# Patient Record
Sex: Female | Born: 1975 | Race: White | Hispanic: No | Smoking: Former smoker
Health system: Southern US, Community
[De-identification: ages and names within clinical notes are randomized; demographics above are authoritative.]

## PROBLEM LIST (undated history)

## (undated) DIAGNOSIS — M4316 Spondylolisthesis, lumbar region: Secondary | ICD-10-CM

## (undated) DIAGNOSIS — I7300 Raynaud's syndrome without gangrene: Secondary | ICD-10-CM

## (undated) DIAGNOSIS — K219 Gastro-esophageal reflux disease without esophagitis: Secondary | ICD-10-CM

## (undated) DIAGNOSIS — J45909 Unspecified asthma, uncomplicated: Secondary | ICD-10-CM

## (undated) DIAGNOSIS — Z8669 Personal history of other diseases of the nervous system and sense organs: Secondary | ICD-10-CM

## (undated) DIAGNOSIS — R87619 Unspecified abnormal cytological findings in specimens from cervix uteri: Secondary | ICD-10-CM

## (undated) HISTORY — DX: Raynaud's syndrome without gangrene: I73.00

## (undated) HISTORY — DX: Gastro-esophageal reflux disease without esophagitis: K21.9

## (undated) HISTORY — DX: Spondylolisthesis, lumbar region: M43.16

## (undated) HISTORY — DX: Unspecified asthma, uncomplicated: J45.909

## (undated) HISTORY — DX: Unspecified abnormal cytological findings in specimens from cervix uteri: R87.619

## (undated) HISTORY — PX: LAPAROSCOPY: SHX197

## (undated) HISTORY — DX: Personal history of other diseases of the nervous system and sense organs: Z86.69

---
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
## 2015-08-25 LAB — BASIC METABOLIC PANEL
BUN: 11 (ref 4–21)
Glucose: 83
Sodium: 139 (ref 137–147)

## 2015-08-25 LAB — TSH: TSH: 1.03 (ref <=5.90)

## 2017-01-13 LAB — HM PAP SMEAR: HM Pap smear: NEGATIVE

## 2018-04-17 ENCOUNTER — Encounter: Payer: Self-pay | Admitting: Family Medicine

## 2018-04-17 ENCOUNTER — Other Ambulatory Visit: Payer: Self-pay

## 2018-04-17 ENCOUNTER — Ambulatory Visit: Payer: Commercial Managed Care - PPO | Admitting: Family Medicine

## 2018-04-17 VITALS — BP 120/72 | HR 77 | Ht 64.0 in | Wt 121.6 lb

## 2018-04-17 DIAGNOSIS — J4520 Mild intermittent asthma, uncomplicated: Secondary | ICD-10-CM | POA: Diagnosis not present

## 2018-04-17 DIAGNOSIS — R87619 Unspecified abnormal cytological findings in specimens from cervix uteri: Secondary | ICD-10-CM

## 2018-04-17 DIAGNOSIS — F17200 Nicotine dependence, unspecified, uncomplicated: Secondary | ICD-10-CM

## 2018-04-17 DIAGNOSIS — N926 Irregular menstruation, unspecified: Secondary | ICD-10-CM

## 2018-04-17 DIAGNOSIS — M47816 Spondylosis without myelopathy or radiculopathy, lumbar region: Secondary | ICD-10-CM | POA: Insufficient documentation

## 2018-04-17 DIAGNOSIS — Z0000 Encounter for general adult medical examination without abnormal findings: Secondary | ICD-10-CM

## 2018-04-17 DIAGNOSIS — Z8669 Personal history of other diseases of the nervous system and sense organs: Secondary | ICD-10-CM

## 2018-04-17 DIAGNOSIS — R87612 Low grade squamous intraepithelial lesion on cytologic smear of cervix (LGSIL): Secondary | ICD-10-CM | POA: Insufficient documentation

## 2018-04-17 DIAGNOSIS — K219 Gastro-esophageal reflux disease without esophagitis: Secondary | ICD-10-CM | POA: Insufficient documentation

## 2018-04-17 DIAGNOSIS — D259 Leiomyoma of uterus, unspecified: Secondary | ICD-10-CM

## 2018-04-17 DIAGNOSIS — M4316 Spondylolisthesis, lumbar region: Secondary | ICD-10-CM

## 2018-04-17 HISTORY — DX: Personal history of other diseases of the nervous system and sense organs: Z86.69

## 2018-04-17 HISTORY — DX: Unspecified abnormal cytological findings in specimens from cervix uteri: R87.619

## 2018-04-17 HISTORY — DX: Spondylolisthesis, lumbar region: M43.16

## 2018-04-17 LAB — LIPID PANEL
CHOLESTEROL: 181 mg/dL (ref 0–200)
HDL: 65.8 mg/dL (ref >39.00)
LDL Cholesterol: 97 mg/dL (ref 0–99)
NonHDL: 114.76
Total CHOL/HDL Ratio: 3
Triglycerides: 90 mg/dL (ref 0.0–149.0)
VLDL: 18 mg/dL (ref 0.0–40.0)

## 2018-04-17 LAB — CBC WITH DIFFERENTIAL/PLATELET
Basophils Absolute: 0.1 10*3/uL (ref 0.0–0.1)
Basophils Relative: 0.8 % (ref 0.0–3.0)
EOS PCT: 3.4 % (ref 0.0–5.0)
Eosinophils Absolute: 0.2 10*3/uL (ref 0.0–0.7)
HCT: 49.4 % — ABNORMAL HIGH (ref 36.0–46.0)
HEMOGLOBIN: 16.9 g/dL — AB (ref 12.0–15.0)
LYMPHS PCT: 24.7 % (ref 12.0–46.0)
Lymphs Abs: 1.8 10*3/uL (ref 0.7–4.0)
MCHC: 34.2 g/dL (ref 30.0–36.0)
MCV: 97.5 fl (ref 78.0–100.0)
Monocytes Absolute: 0.5 10*3/uL (ref 0.1–1.0)
Monocytes Relative: 6.6 % (ref 3.0–12.0)
Neutro Abs: 4.6 10*3/uL (ref 1.4–7.7)
Neutrophils Relative %: 64.5 % (ref 43.0–77.0)
Platelets: 248 10*3/uL (ref 150.0–400.0)
RBC: 5.07 Mil/uL (ref 3.87–5.11)
RDW: 13.4 % (ref 11.5–15.5)
WBC: 7.2 10*3/uL (ref 4.0–10.5)

## 2018-04-17 LAB — COMPREHENSIVE METABOLIC PANEL
ALBUMIN: 5.1 g/dL (ref 3.5–5.2)
ALK PHOS: 57 U/L (ref 39–117)
ALT: 15 U/L (ref 0–35)
AST: 12 U/L (ref 0–37)
BUN: 5 mg/dL — AB (ref 6–23)
CO2: 28 mEq/L (ref 19–32)
Calcium: 10.2 mg/dL (ref 8.4–10.5)
Chloride: 105 mEq/L (ref 96–112)
Creatinine, Ser: 0.64 mg/dL (ref 0.40–1.20)
GFR: 108.62 mL/min (ref >60.00)
Glucose, Bld: 77 mg/dL (ref 70–99)
POTASSIUM: 4.1 meq/L (ref 3.5–5.1)
Sodium: 142 mEq/L (ref 135–145)
TOTAL PROTEIN: 7.6 g/dL (ref 6.0–8.3)
Total Bilirubin: 0.6 mg/dL (ref 0.2–1.2)

## 2018-04-17 LAB — TSH: TSH: 1.29 u[IU]/mL (ref 0.35–4.50)

## 2018-04-17 LAB — POCT URINE PREGNANCY: Preg Test, Ur: NEGATIVE

## 2018-04-17 NOTE — Patient Instructions (Signed)
Please return in 12 months for your annual complete physical; please come fasting.  I will release your lab results to you on your MyChart account with further instructions. Please reply with any questions.   We will call you with information regarding your referral appointment. GYN for f/u on pap smears and irregular bleeding and birth control.  If you do not hear from [place] within the next 2 weeks, please let me know. It can take 1-2 weeks to get appointments set up with the specialists.   It was a pleasure meeting you today! Thank you for choosing [place] to meet your healthcare needs! I truly look forward to working with you. If you have any questions or concerns, please send me a message via Mychart or call the office at [phone]. Please do these things to maintain good health!   Exercise at least 30-45 minutes a day,  4-5 days a week.   Eat a low-fat diet with lots of fruits and vegetables, up to 7-9 servings per day.  Drink plenty of water daily. Try to drink 8 8oz glasses per day.  Seatbelts can save your life. Always wear your seatbelt.  Place Smoke Detectors on every level of your home and check batteries every year.  Schedule an appointment with an eye doctor for an eye exam every 1-2 years  Safe sex - use condoms to protect yourself from STDs if you could be exposed to these types of infections. Use birth control if you do not want to become pregnant and are sexually active.  Avoid heavy alcohol use. If you drink, keep it to less than 2 drinks/day and not every day.  [place].  Choose someone you trust that could speak for you if you became unable to speak for yourself.  Depression is common in our stressful world.If you're feeling down or losing interest in things you normally enjoy, please come in for a visit.  If anyone is threatening or hurting you, please get help. Physical or Emotional Violence is never OK.

## 2018-04-17 NOTE — Progress Notes (Signed)
Subjective  CC:  Chief Complaint  Patient presents with  . Establish Care    Relocated here from [place], last Physical 2016, unsure of month   . Menstrual Problem    Patient states that she hasn't had a period in over two months, wants Thyroid Checked    HPI: Julie Horn is a 42 y.o. female who presents to [place] at [facility] today to establish care with me as a new patient.  42 year old G33, P0, newly remarried after widowed in 2017 who relocated to [facility] from [place].  She lives with her husband, has 2 children, and her 65 year old adopted son.  She works full-time as a Warehouse manager.  She is working remotely.  Her transition has been good.  Here for annual exam and new problem visit. She has the following concerns or needs:  Her main concern is related to her gynecologic health.  She reports a long history of irregular and abnormal menstrual bleeding patterns.  Over the last year and a half they have been pretty well controlled however now missing periods.  Her last period was in July.  She has taken pregnancy test and they are negative.  She has never been pregnant.  Possibly infertile.  She does not use birth control.  She denies painful vaginal bleeding or vaginal discharge.  No history of STDs.  She does have a history of abnormal Pap smear status post colposcopy in 2017 without further evaluation done.  She would like to consider birth control.  She did not do well on oral contraceptives.  She is a smoker.  Her weight is stable, diet is regular, exercises normal.  She has a history of uterine fibroids.  Past medical history is significant for asthma, chronic smoker with recurrent bronchitis.  Rare albuterol need.  Mild allergies, GERD symptoms.  History of low back pain due to arthritis.  History of migraines in the been well controlled behaviorally over the last several years.  No history of mood problems.  Health maintenance:  Due for repeat Pap smear but prefers gynecology review, we need to request old records.  Refuses flu vaccination.  Lives healthy lifestyle.  Assessment  1. Annual physical exam   2. Missed menses   3. Nicotine dependence with current use   4. Intermittent asthma, uncomplicated   5. Gastroesophageal reflux disease, esophagitis presence not specified   6. History of migraine   7. Spondylosis of lumbar region without myelopathy or radiculopathy   8. Abnormal cervical Papanicolaou smear, unspecified abnormal pap finding   9. Uterine leiomyoma, unspecified location      Plan  Female Wellness Visit:  Age appropriate Health Maintenance and Prevention measures were discussed with patient. Included topics are cancer screening recommendations, ways to keep healthy (see AVS) including dietary and exercise recommendations, regular eye and dental care, use of seat belts, and avoidance of moderate alcohol use and tobacco use.   BMI: discussed patient's BMI and encouraged positive lifestyle modifications to help get to or maintain a target BMI.  HM needs and immunizations were addressed and ordered. See below for orders. See HM and immunization section for updates.  Routine labs and screening tests ordered including cmp, cbc and lipids where appropriate.  Discussed recommendations regarding Vit D and calcium supplementation (see AVS)  New patient new problem visit:   Discussed smoking cessation: Patient is pre-contemplative at this time.  Discussed inability to use hormonal birth control with estrogen if she is smoking.  Patient will work on thinking about quitting.  Stress is the main barrier at this time.  Discussed link between asthma, COPD and smoking.  Missed menses, abnormal Pap, need for birth control: Refer to gynecology and request old records.  Check prolactin and TSH today.  Low back pain: Not active now.  Continue healthy lifestyle and exercise.  GERD: Manages behaviorally and with  natural therapies.  Weight management has significantly helped both her back pain and her GERD.  Migraine history: We will monitor.  Not active now.   I discussed with patient that the coding for this visit will include the wellness/preventive visit along with the code for a new patient problem visit. Documentation reflects the need for this.  Follow up:  No follow-ups on file. Orders Placed This Encounter  Procedures  . CBC with Differential/Platelet  . Comprehensive metabolic panel  . Lipid panel  . HIV Antibody (routine testing w rflx)  . TSH  . Prolactin  . Ambulatory referral to Obstetrics / Gynecology  . POCT urine pregnancy   No orders of the defined types were placed in this encounter.    Depression screen PHQ 2/9 04/17/2018  Decreased Interest 0  Down, Depressed, Hopeless 0  PHQ - 2 Score 0    We updated and reviewed the patient's past history in detail and it is documented below.  Patient Active Problem List   Diagnosis Date Noted  . Nicotine dependence with current use 04/17/2018  . Intermittent asthma, uncomplicated 93/81/0175  . GERD (gastroesophageal reflux disease) 04/17/2018  . History of migraine 04/17/2018    Had been managed on Topamax but had side effect. Now not active.   Marland Kitchen Spondylolisthesis of lumbar region 04/17/2018  . 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 Abnormal Pap smear of cervix 04/17/2018  . Fibroid uterus 04/17/2018   Health Maintenance  Topic Date Due  . HIV Screening  03/03/1992  . PAP SMEAR  07/25/2018  . TETANUS/TDAP  07/25/2022  . INFLUENZA VACCINE  Discontinued    There is no immunization history on file for this patient. Current Meds  Medication Sig  . B COMPLEX VITAMINS PO Take by mouth.  . Chromium Picolinate (CHROMIUM PICOLATE PO) Take 500 mg by mouth.  . L-LYSINE PO Take 1,000 mg by mouth.  . Multiple Vitamin (MULTIVITAMIN) tablet Take 1 tablet by mouth daily.  Marland Kitchen OVER THE COUNTER MEDICATION 500  mg.    Allergies: Patient is allergic to latex and sulfa antibiotics. Past Medical History Patient  has a past medical history of Abnormal Pap smear of cervix (04/17/2018), Asthma, GERD (gastroesophageal reflux disease), History of migraine (04/17/2018), and Spondylolisthesis of lumbar region (04/17/2018). Past Surgical History Patient  has a past surgical history that includes Tonsillectomy and adenoidectomy (1983) and laparoscopy. Family History: Patient family history is not on file. Social History:  Patient  reports that she has been smoking cigarettes. She has a 20.00 pack-year smoking history. She has never used smokeless tobacco. She reports that she drinks alcohol. She reports that she has current or past drug history.  Review of Systems: Constitutional: negative for fever or malaise Ophthalmic: negative for photophobia, double vision or loss of vision Cardiovascular: negative for chest pain, dyspnea on exertion, or new LE swelling Respiratory: negative for SOB or persistent cough Gastrointestinal: negative for abdominal pain, change in bowel habits or melena Genitourinary: negative for dysuria or gross hematuria Musculoskeletal: negative for new gait disturbance or muscular weakness Integumentary: negative for new or persistent  rashes Neurological: negative for TIA or stroke symptoms Psychiatric: negative for SI or delusions Allergic/Immunologic: negative for hives  Patient Care Team    Relationship Specialty Notifications Start End  Leamon Arnt, MD PCP - General Family Medicine  04/17/18     Objective  Vitals: BP 120/72   Pulse 77   Ht 5\' 4"  (1.626 m)   Wt 121 lb 9.6 oz (55.2 kg)   SpO2 98%   BMI 20.87 kg/m  General:  Well developed, well nourished, no acute distress, thin Psych:  Alert and oriented,normal mood and affect HEENT:  Normocephalic, atraumatic, non-icteric sclera, PERRL, oropharynx is without mass or exudate, supple neck without adenopathy, mass or  thyromegaly Cardiovascular:  RRR without gallop, rub or murmur, nondisplaced PMI Respiratory:  Good breath sounds bilaterally, CTAB with normal respiratory effort, no wheezing Gastrointestinal: normal bowel sounds, soft, non-tender, no noted masses. No HSM MSK: no deformities, contusions. Joints are without erythema or swelling Skin:  Warm, no rashes or suspicious lesions noted Neurologic:    Mental status is normal. Gross motor and sensory exams are normal. Normal gait, no tremor  Office Visit on 04/17/2018  Component Date Value Ref Range Status  . Preg Test, Ur 04/17/2018 Negative  Negative Final     Commons side effects, risks, benefits, and alternatives for medications and treatment plan prescribed today were discussed, and the patient expressed understanding of the given instructions. Patient is instructed to call or message via MyChart if he/she has any questions or concerns regarding our treatment plan. No barriers to understanding were identified. We discussed Red Flag symptoms and signs in detail. Patient expressed understanding regarding what to do in case of urgent or emergency type symptoms.   Medication list was reconciled, printed and provided to the patient in AVS. Patient instructions and summary information was reviewed with the patient as documented in the AVS. This note was prepared with assistance of Dragon voice recognition software. Occasional wrong-word or sound-a-like substitutions may have occurred due to the inherent limitations of voice recognition software

## 2018-04-18 LAB — HIV ANTIBODY (ROUTINE TESTING W REFLEX): HIV: NONREACTIVE

## 2018-04-18 LAB — PROLACTIN: Prolactin: 9.5 ng/mL

## 2018-05-01 ENCOUNTER — Encounter: Payer: Self-pay | Admitting: Family Medicine

## 2018-05-01 MED ORDER — DIAZEPAM 5 MG PO TABS: ORAL_TABLET | ORAL | 0 refills | Status: DC

## 2018-05-09 ENCOUNTER — Encounter: Payer: Self-pay | Admitting: Emergency Medicine

## 2018-05-22 ENCOUNTER — Other Ambulatory Visit: Payer: Self-pay

## 2018-05-22 ENCOUNTER — Encounter: Payer: Self-pay | Admitting: Family Medicine

## 2018-05-22 ENCOUNTER — Ambulatory Visit: Payer: Commercial Managed Care - PPO | Admitting: Family Medicine

## 2018-05-22 ENCOUNTER — Ambulatory Visit: Payer: Self-pay

## 2018-05-22 VITALS — BP 122/84 | HR 83 | Temp 98.1°F | Ht 64.0 in | Wt 123.8 lb

## 2018-05-22 DIAGNOSIS — J4521 Mild intermittent asthma with (acute) exacerbation: Secondary | ICD-10-CM

## 2018-05-22 DIAGNOSIS — J209 Acute bronchitis, unspecified: Secondary | ICD-10-CM

## 2018-05-22 DIAGNOSIS — T7840XA Allergy, unspecified, initial encounter: Secondary | ICD-10-CM

## 2018-05-22 MED ORDER — GUAIFENESIN-CODEINE 100-10 MG/5ML PO SOLN: 5.0000 mL | Freq: Four times a day (QID) | ORAL | 0 refills | Status: DC | PRN

## 2018-05-22 NOTE — Telephone Encounter (Signed)
Pt. Reports she went to a minute clinic last week and was started in Z-Pak and steroid for bronchitis. Still coughing and "is miserable." Started breaking out in hives day before yesterday. Took Benadryl last and when she got up this morning, "the hives were even bigger - some as large as my hand." "Very itchy." Hives are on her bottom and legs only this morning. Denies any chest pain or shortness of breath. Apointment made for this morning. Instructed if symptoms worsen to go to ED. Verbalizes understanding.  Reason for Disposition . [1] MODERATE-SEVERE hives persist (i.e., hives interfere with normal activities or work) AND [2] taking antihistamine (e.g., Benadryl, Claritin) > 24 hours  Answer Assessment - Initial Assessment Questions 1. APPEARANCE: "What does the rash look like?"      Hives 2. LOCATION: "Where is the rash located?"      On her behind and legs 3. NUMBER: "How many hives are there?"      Too many to count 4. SIZE: "How big are the hives?" (inches, cm, compare to coins) "Do they all look the same or is there lots of variation in shape and size?"      Size of her hand and smaller 5. ONSET: "When did the hives begin?" (Hours or days ago)      2 DAYS AGO 6. ITCHING: "Does it itch?" If so, ask: "How bad is the itch?"    - MILD: doesn't interfere with normal activities   - MODERATE - SEVERE: interferes with work, school, sleep, or other activities      mODERATE 7. RECURRENT PROBLEM: "Have you had hives before?" If so, ask: "When was the last time?" and "What happened that time?"      Yes 8. TRIGGERS: "Were you exposed to any new food, plant, cosmetic product or animal just before the hives began?"     Antibiotic 9. OTHER SYMPTOMS: "Do you have any other symptoms?" (e.g., fever, tongue swelling, difficulty breathing, abdominal pain)     No 10. PREGNANCY: "Is there any chance you are pregnant?" "When was your last menstrual period?"       No  Protocols used: HIVES-A-AH

## 2018-05-22 NOTE — Progress Notes (Signed)
Subjective  CC:  Chief Complaint  Patient presents with  . Urticaria    hives mostly on her legs Patient states she started z-pak on friday, treated with Benadryl, keep coming back     HPI: Julie Horn is a 42 y.o. female who presents to the office today to address the problems listed above in the chief complaint.  42 year old recently seen by [facility] minute clinic and treated for bronchitis with Z-Pak, prednisone taper, cough syrup and albuterol inhaler.  She reports she continues to have significant coughing spasms.  No shortness of breath and fever.  However, last night broke out in diffuse rash upper chest and legs.  She brings in pictures.  The rash resolved with Benadryl.  She denies current shortness of breath.  She is extremely exhausted and has lower abdominal pain from coughing.  No other GI symptoms.  She is a smoker, she does have asthma.  I reviewed the note from minute clinic Assessment  1. Acute bronchitis, unspecified organism   2. Mild intermittent asthma with exacerbation   3. Allergic reaction to drug, initial encounter      Plan   Acute bronchitis: Almost completed the Z-Pak.  No more antibiotics indicated.  Lungs are clear to auscultation now.  Complete prednisone taper.  Add Robitussin with codeine for cough and sleep.  Albuterol as needed  Allergic drug reaction to Z-Pak.  Educated.  Zyrtec nightly and Benadryl as needed over the next week. Follow up: Follow-up if worsens  No orders of the defined types were placed in this encounter.  Meds ordered this encounter  Medications  . guaiFENesin-codeine 100-10 MG/5ML syrup    Sig: Take 5 mLs by mouth every 6 (six) hours as needed for cough.    Dispense:  120 mL    Refill:  0      I reviewed the patients updated PMH, FH, and SocHx.    Patient Active Problem List   Diagnosis Date Noted  . Nicotine dependence with current use 04/17/2018    Priority: High  . Abnormal Pap smear of cervix 04/17/2018    Priority:  High  . Intermittent asthma, uncomplicated 84/13/2440    Priority: Medium  . GERD (gastroesophageal reflux disease) 04/17/2018    Priority: Medium  . History of migraine 04/17/2018    Priority: Medium  . Spondylolisthesis of lumbar region 04/17/2018    Priority: Medium  . 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 Fibroid uterus 04/17/2018    Priority: Medium   Current Meds  Medication Sig  . B COMPLEX VITAMINS PO Take by mouth.  . Chromium Picolinate (CHROMIUM PICOLATE PO) Take 500 mg by mouth.  . L-LYSINE PO Take 1,000 mg by mouth.  . Multiple Vitamin (MULTIVITAMIN) tablet Take 1 tablet by mouth daily.  Marland Kitchen OVER THE COUNTER MEDICATION 500 mg.  . predniSONE (DELTASONE) 10 MG tablet   . promethazine-dextromethorphan (PROMETHAZINE-DM) 6.25-15 MG/5ML syrup     Allergies: Patient is allergic to latex; levofloxacin; sulfa antibiotics; and azithromycin. Family History: Patient family history includes Asthma in her brother and mother; Cancer in her maternal grandmother; Heart attack in her maternal grandfather. Social History:  Patient  reports that she has been smoking cigarettes. She has a 20.00 pack-year smoking history. She has never used smokeless tobacco. She reports that she drinks alcohol. She reports that she has current or past drug history.  Review of Systems: Constitutional: Negative for fever malaise or anorexia Cardiovascular: negative  for chest pain Respiratory: negative for SOB or persistent cough Gastrointestinal: negative for abdominal pain  Objective  Vitals: BP 122/84   Pulse 83   Temp 98.1 F (36.7 C)   Ht 5\' 4"  (1.626 m)   Wt 123 lb 12.8 oz (56.2 kg)   SpO2 99%   BMI 21.25 kg/m  General: no acute distress , A&Ox3, appears tired, nontoxic, no respiratory distress HEENT: PEERL, conjunctiva normal, Oropharynx moist,neck is supple, normal TMs Cardiovascular:  RRR without murmur or gallop.  Respiratory:  Good breath  sounds bilaterally, CTAB with normal respiratory effort with occasional rhonchi, no wheeze Skin:  Warm, no rashes Reviewed pictures of diffuse maculopapular rash.     Commons side effects, risks, benefits, and alternatives for medications and treatment plan prescribed today were discussed, and the patient expressed understanding of the given instructions. Patient is instructed to call or message via MyChart if he/she has any questions or concerns regarding our treatment plan. No barriers to understanding were identified. We discussed Red Flag symptoms and signs in detail. Patient expressed understanding regarding what to do in case of urgent or emergency type symptoms.   Medication list was reconciled, printed and provided to the patient in AVS. Patient instructions and summary information was reviewed with the patient as documented in the AVS. This note was prepared with assistance of Dragon voice recognition software. Occasional wrong-word or sound-a-like substitutions may have occurred due to the inherent limitations of voice recognition software

## 2018-05-22 NOTE — Patient Instructions (Signed)
Please follow up if symptoms do not improve or as needed.   Start Zyrtec 10mg  nightly. Use benadryl in between as needed for the rash.  Take the new cough syrup at night.  Get some rest!!

## 2018-05-30 ENCOUNTER — Encounter: Payer: Self-pay | Admitting: Family Medicine

## 2018-05-31 MED ORDER — ALBUTEROL SULFATE HFA 108 (90 BASE) MCG/ACT IN AERS: 2.0000 | INHALATION_SPRAY | RESPIRATORY_TRACT | 2 refills | Status: DC | PRN

## 2018-06-08 ENCOUNTER — Encounter: Payer: Self-pay | Admitting: Family Medicine

## 2018-06-08 NOTE — Telephone Encounter (Signed)
Please print form, complete it from cpe visit in December and fax for pt. Send message to her once complete. Thanks.

## 2018-06-08 NOTE — Telephone Encounter (Signed)
Please advise 

## 2018-06-18 ENCOUNTER — Encounter: Payer: Self-pay | Admitting: Family Medicine

## 2018-08-31 DIAGNOSIS — H04123 Dry eye syndrome of bilateral lacrimal glands: Secondary | ICD-10-CM | POA: Diagnosis not present

## 2018-08-31 DIAGNOSIS — H40052 Ocular hypertension, left eye: Secondary | ICD-10-CM | POA: Diagnosis not present

## 2018-08-31 DIAGNOSIS — Z8669 Personal history of other diseases of the nervous system and sense organs: Secondary | ICD-10-CM | POA: Diagnosis not present

## 2018-08-31 DIAGNOSIS — H40013 Open angle with borderline findings, low risk, bilateral: Secondary | ICD-10-CM | POA: Diagnosis not present

## 2018-10-15 ENCOUNTER — Telehealth: Payer: Self-pay

## 2018-10-15 NOTE — Telephone Encounter (Signed)
LMOVM with instructions to call back. CRM created.

## 2018-10-15 NOTE — Telephone Encounter (Signed)
Patient is experiencing flu like symptoms with fever. Patient has asthma and is almost out of her Albuterol, Please advise is patient needs to be seen or if meds can be refilled.

## 2018-10-15 NOTE — Telephone Encounter (Signed)
Spoke with CMA concerning above patient. She is SOB on phone. Triaged to ER giving current pandemic, current symptoms and respiratory distress.

## 2018-10-15 NOTE — Telephone Encounter (Signed)
Conversation with patient. No noted SOB while talking. She denies any difficulty breathing. Temperature this morning 99.6 oral. No known travels no known exposures. Occasional dry cough. Reported mild wheezing last night but none this morning after albuterol inhaler x1. Care advice reviewed:if fever persist more than 3 days or above 100.5 to call back. Also, any difficulty breathing/SOB/wheezing to call back. She has refills on the albuterol and will pick up today. Hygiene/safety precautions reviewed.

## 2018-10-18 ENCOUNTER — Encounter: Payer: Self-pay | Admitting: Family Medicine

## 2018-10-18 ENCOUNTER — Other Ambulatory Visit: Payer: Self-pay

## 2018-10-18 ENCOUNTER — Ambulatory Visit (INDEPENDENT_AMBULATORY_CARE_PROVIDER_SITE_OTHER): Payer: Commercial Managed Care - PPO | Admitting: Family Medicine

## 2018-10-18 DIAGNOSIS — J069 Acute upper respiratory infection, unspecified: Secondary | ICD-10-CM

## 2018-10-18 DIAGNOSIS — J301 Allergic rhinitis due to pollen: Secondary | ICD-10-CM | POA: Diagnosis not present

## 2018-10-18 DIAGNOSIS — J4521 Mild intermittent asthma with (acute) exacerbation: Secondary | ICD-10-CM

## 2018-10-18 DIAGNOSIS — F17200 Nicotine dependence, unspecified, uncomplicated: Secondary | ICD-10-CM | POA: Diagnosis not present

## 2018-10-18 MED ORDER — MONTELUKAST SODIUM 10 MG PO TABS: 10.0000 mg | ORAL_TABLET | Freq: Every day | ORAL | 3 refills | Status: DC

## 2018-10-18 NOTE — Progress Notes (Signed)
I have discussed the procedure for the virtual visit with the patient who has given consent to proceed with assessment and treatment.   Tiara S Simmons, CMA     

## 2018-10-18 NOTE — Telephone Encounter (Signed)
Please call patient to set up video visit for allergy and asthma symptoms and discuss medications and f/u from recent illness.

## 2018-10-18 NOTE — Progress Notes (Signed)
Virtual Visit via Video Note  Subjective  CC:  Chief Complaint  Patient presents with  . Allergies    She reports that on 10/22/2018, she SOB, wheezing, coughing, and fever of 101.     HPI:  I connected with Julie Horn on 10/18/18 at  2:30 PM EDT by a video enabled telemedicine application and verified that I am speaking with the correct person using two identifiers. Location patient: Home Location provider: [facility], Office Persons participating in the virtual visit: Julie Horn, Leamon Arnt, MD   I discussed the limitations of evaluation and management by telemedicine and the availability of in person appointments. The patient expressed understanding and agreed to proceed.   Reviewed telephone note from 3/23: fever and URI illness. Then today called in to get singulair so I wanted to visit with her to ensure her respiratory stability and clarify the illness and progression. 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 Used albuterol and tylenol and otc cough medications. Now improving, still requiring albuterol q 4 with good results: breathing returns to normal after use. Dry cough. Lessening. No malaise, n/v/d or abdominal pain.   + allergies and has used singulair in the past and would like to restart.   Assessment  1. Mild intermittent asthma with exacerbation   2. Seasonal allergic rhinitis due to pollen   3. Viral URI      Plan   Asthma exacerbation with viral respiratory infection, resolving:  Nonspecific viral illness or coronavirus: mild sxs. Not currently meeting criteria for [facility] testing requirements. Recommend self isolating (has been working remotely from home), continue albuterol and otc meds and monitor breathing status. Add singulair.   Call if worsening.  I discussed the assessment and treatment plan with the patient. The patient was provided  an opportunity to ask questions and all were answered. The patient agreed with the plan and demonstrated an understanding of the instructions.   The patient was advised to call back or seek an in-person evaluation if the symptoms worsen or if the condition fails to improve as anticipated. Follow up: Return in about 6 months (around 04/20/2019) for complete physical.  Visit date not found  No orders of the defined types were placed in this encounter.     I reviewed the patients updated PMH, FH, and SocHx.    Patient Active Problem List   Diagnosis Date Noted  . Nicotine dependence with current use 04/17/2018    Priority: High  . Abnormal Pap smear of cervix 04/17/2018    Priority: High  . Intermittent asthma, uncomplicated 13/24/4010    Priority: Medium  . GERD (gastroesophageal reflux disease) 04/17/2018    Priority: Medium  . History of migraine 04/17/2018    Priority: Medium  . Spondylolisthesis of lumbar region 04/17/2018    Priority: Medium  . 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 Fibroid uterus 04/17/2018    Priority: Medium   No outpatient medications have been marked as taking for the 10/18/18 encounter (Office Visit) with Leamon Arnt, MD.    Allergies: Patient is allergic to latex; levofloxacin; sulfa antibiotics; and azithromycin. Family History: Patient family history includes Asthma in her brother and mother; Cancer in her maternal grandmother; Heart attack in her maternal grandfather. Social History:  Patient  reports that she has been smoking cigarettes. She has a 20.00  pack-year smoking history. She has never used smokeless tobacco. She reports current alcohol use. She reports previous drug use.   ROS: negative pleuritic chest pain, no sob, + wheezing, + dry cough,   @OBJECTIVE /OBSERVATIONS@ Vitals: There were no vitals taken for this visit. pt reported low grade fever 99.4 today General: no acute distress ,  A&Ox3 Respirations were normal appearing w/o tachypnea nor retractions.  Nasal congestion present.    I provided 15 minutes of non-face-to-face time during this encounter. Leamon Arnt, MD

## 2018-10-24 ENCOUNTER — Encounter: Payer: Self-pay | Admitting: Family Medicine

## 2018-10-25 ENCOUNTER — Encounter: Payer: Self-pay | Admitting: Family

## 2018-10-25 ENCOUNTER — Telehealth: Payer: Commercial Managed Care - PPO | Admitting: Family

## 2018-10-25 ENCOUNTER — Other Ambulatory Visit: Payer: Self-pay | Admitting: Family Medicine

## 2018-10-25 DIAGNOSIS — R059 Cough, unspecified: Secondary | ICD-10-CM

## 2018-10-25 DIAGNOSIS — R05 Cough: Secondary | ICD-10-CM

## 2018-10-25 MED ORDER — DOXYCYCLINE HYCLATE 100 MG PO TABS: 100.0000 mg | ORAL_TABLET | Freq: Two times a day (BID) | ORAL | 0 refills | Status: AC

## 2018-10-25 MED ORDER — PROMETHAZINE-DM 6.25-15 MG/5ML PO SYRP: 5.0000 mL | ORAL_SOLUTION | Freq: Four times a day (QID) | ORAL | 0 refills | Status: DC | PRN

## 2018-10-25 NOTE — Progress Notes (Signed)
E-Visit for Corona Virus Screening  Based on your current symptoms, you may very well have the virus, however your symptoms are mild. Currently, not all patients are being tested. If the symptoms are mild and there is not a known exposure, performing the test is not indicated.  Coronavirus disease 2019 (COVID-19) is a respiratory illness that can spread from person to person. The virus that causes COVID-19 is a new virus that was first identified in the country of [place] but is now found in multiple other countries and has spread to the [place].  Symptoms associated with the virus are mild to severe fever, cough, and shortness of breath. There is currently no vaccine to protect against COVID-19, and there is no specific antiviral treatment for the virus.   To be considered HIGH RISK for Coronavirus (COVID-19), you have to meet the following criteria:  . Traveled to [place], [place], [place], [place] or [place]; or in the [place] to [place], [place], [place], or [place]; and have fever, cough, and shortness of breath within the last 2 weeks of travel OR  . Been in close contact with a person diagnosed with COVID-19 within the last 2 weeks and have fever, cough, and shortness of breath  . IF YOU DO NOT MEET THESE CRITERIA, YOU ARE CONSIDERED LOW RISK FOR COVID-19.   It is vitally important that if you feel that you have an infection such as this virus or any other virus that you stay home and away from places where you may spread it to others.  You should self-quarantine for 14 days if you have symptoms that could potentially be coronavirus and avoid contact with people age 80 and older.   You can use medication such as A prescription cough medication called Phenergan DM 6.25 mg/15 mg. You make take one teaspoon / 5 ml every 4-6 hours as needed for cough  You may also take acetaminophen (Tylenol) as needed for fever.  You have been sick 11 days at this point. I would recommend taking  3 additional days off work. I will send you a note.   Reduce your risk of any infection by using the same precautions used for avoiding the common cold or flu:  Marland Kitchen Wash your hands often with soap and warm water for at least 20 seconds.  If soap and water are not readily available, use an alcohol-based hand sanitizer with at least 60% alcohol.  . If coughing or sneezing, cover your mouth and nose by coughing or sneezing into the elbow areas of your shirt or coat, into a tissue or into your sleeve (not your hands). . Avoid shaking hands with others and consider head nods or verbal greetings only. . Avoid touching your eyes, nose, or mouth with unwashed hands.  . Avoid close contact with people who are sick. . Avoid places or events with large numbers of people in one location, like concerts or sporting events. . Carefully consider travel plans you have or are making. . If you are planning any travel outside or inside the [place], visit the CDC's Travelers' Health webpage for the latest health notices. . If you have some symptoms but not all symptoms, continue to monitor at home and seek medical attention if your symptoms worsen. . If you are having a medical emergency, call 911.  HOME CARE . Only take medications as instructed by your medical team. . Drink plenty of fluids and get plenty of rest. . A steam or  ultrasonic humidifier can help if you have congestion.   GET HELP RIGHT AWAY IF: . You develop worsening fever. . You become short of breath . You cough up blood. . Your symptoms become more severe MAKE SURE YOU   Understand these instructions.  Will watch your condition.  Will get help right away if you are not doing well or get worse.  Your e-visit answers were reviewed by a board certified advanced clinical practitioner to complete your personal care plan.  Depending on the condition, your plan could have included both over the counter or prescription medications.  If there is a  problem please reply once you have received a response from your provider. Your safety is important to [place].  If you have drug allergies check your prescription carefully.    You can use MyChart to ask questions about today's visit, request a non-urgent call back, or ask for a work or school excuse for 24 hours related to this e-Visit. If it has been greater than 24 hours you will need to follow up with your provider, or enter a new e-Visit to address those concerns. You will get an e-mail in the next two days asking about your experience.  I hope that your e-visit has been valuable and will speed your recovery. Thank you for using e-visits.

## 2018-10-25 NOTE — Progress Notes (Signed)
See mychart message.  Treating empirically for respiratory sxs and low grade fevers on and off x 10 days.  ? Covid-19

## 2018-10-29 ENCOUNTER — Encounter: Payer: Self-pay | Admitting: Family Medicine

## 2018-10-29 ENCOUNTER — Emergency Department (HOSPITAL_BASED_OUTPATIENT_CLINIC_OR_DEPARTMENT_OTHER): Payer: Commercial Managed Care - PPO

## 2018-10-29 ENCOUNTER — Other Ambulatory Visit: Payer: Self-pay

## 2018-10-29 ENCOUNTER — Ambulatory Visit (INDEPENDENT_AMBULATORY_CARE_PROVIDER_SITE_OTHER): Payer: Commercial Managed Care - PPO | Admitting: Family Medicine

## 2018-10-29 ENCOUNTER — Emergency Department (HOSPITAL_BASED_OUTPATIENT_CLINIC_OR_DEPARTMENT_OTHER)
Admission: EM | Admit: 2018-10-29 | Discharge: 2018-10-29 | Disposition: A | Discharge status: Home / Self Care | Payer: Commercial Managed Care - PPO | Attending: Emergency Medicine | Admitting: Emergency Medicine

## 2018-10-29 ENCOUNTER — Encounter (HOSPITAL_BASED_OUTPATIENT_CLINIC_OR_DEPARTMENT_OTHER): Payer: Self-pay | Admitting: Emergency Medicine

## 2018-10-29 DIAGNOSIS — R6889 Other general symptoms and signs: Secondary | ICD-10-CM

## 2018-10-29 DIAGNOSIS — R0602 Shortness of breath: Secondary | ICD-10-CM

## 2018-10-29 DIAGNOSIS — F17200 Nicotine dependence, unspecified, uncomplicated: Secondary | ICD-10-CM | POA: Diagnosis not present

## 2018-10-29 DIAGNOSIS — R05 Cough: Secondary | ICD-10-CM | POA: Diagnosis not present

## 2018-10-29 DIAGNOSIS — Z03818 Encounter for observation for suspected exposure to other biological agents ruled out: Secondary | ICD-10-CM | POA: Diagnosis not present

## 2018-10-29 DIAGNOSIS — B9789 Other viral agents as the cause of diseases classified elsewhere: Secondary | ICD-10-CM

## 2018-10-29 DIAGNOSIS — J988 Other specified respiratory disorders: Secondary | ICD-10-CM

## 2018-10-29 DIAGNOSIS — F17210 Nicotine dependence, cigarettes, uncomplicated: Secondary | ICD-10-CM | POA: Diagnosis not present

## 2018-10-29 DIAGNOSIS — Z20822 Contact with and (suspected) exposure to covid-19: Secondary | ICD-10-CM

## 2018-10-29 DIAGNOSIS — Z79899 Other long term (current) drug therapy: Secondary | ICD-10-CM | POA: Diagnosis not present

## 2018-10-29 DIAGNOSIS — J069 Acute upper respiratory infection, unspecified: Secondary | ICD-10-CM | POA: Insufficient documentation

## 2018-10-29 DIAGNOSIS — J45909 Unspecified asthma, uncomplicated: Secondary | ICD-10-CM | POA: Diagnosis not present

## 2018-10-29 DIAGNOSIS — Z91040 Latex allergy status: Secondary | ICD-10-CM | POA: Diagnosis not present

## 2018-10-29 DIAGNOSIS — J4520 Mild intermittent asthma, uncomplicated: Secondary | ICD-10-CM

## 2018-10-29 NOTE — Telephone Encounter (Signed)
Please set up video visit so I can check on her. thanks

## 2018-10-29 NOTE — ED Notes (Signed)
Xray complete

## 2018-10-29 NOTE — Patient Instructions (Signed)
Please go directly to [facility] in [place] for evaluation.

## 2018-10-29 NOTE — Discharge Instructions (Addendum)
We are concerned that your respiratory illness could be COVID-19.  It is very important to self quarantine for at least 7 days and you must be symptom-free for at least the last 3 days.  If at any point you develop trouble breathing you are encouraged to return to the emergency department.  Otherwise follow-up with your primary care physician.     Person Under Monitoring Name: Julie Horn  Location: [place] [address]   Infection Prevention Recommendations for Individuals Confirmed to have, or Being Evaluated for, 2019 Novel Coronavirus (COVID-19) Infection Who Receive Care at Home  Individuals who are confirmed to have, or are being evaluated for, COVID-19 should follow the prevention steps below until a healthcare provider or local or state health department says they can return to normal activities.  Stay home except to get medical care You should restrict activities outside your home, except for getting medical care. Do not go to work, school, or public areas, and do not use public transportation or taxis.  Call ahead before visiting your doctor Before your medical appointment, call the healthcare provider and tell them that you have, or are being evaluated for, COVID-19 infection. This will help the healthcare providers office take steps to keep other people from getting infected. Ask your healthcare provider to call the local or state health department.  Monitor your symptoms Seek prompt medical attention if your illness is worsening (e.g., difficulty breathing). Before going to your medical appointment, call the healthcare provider and tell them that you have, or are being evaluated for, COVID-19 infection. Ask your healthcare provider to call the local or state health department.  Wear a facemask You should wear a facemask that covers your nose and mouth when you are in the same room with other people and when you visit a healthcare provider. People who  live with or visit you should also wear a facemask while they are in the same room with you.  Separate yourself from other people in your home As much as possible, you should stay in a different room from other people in your home. Also, you should use a separate bathroom, if available.  Avoid sharing household items You should not share dishes, drinking glasses, cups, eating utensils, towels, bedding, or other items with other people in your home. After using these items, you should wash them thoroughly with soap and water.  Cover your coughs and sneezes Cover your mouth and nose with a tissue when you cough or sneeze, or you can cough or sneeze into your sleeve. Throw used tissues in a lined trash can, and immediately wash your hands with soap and water for at least 20 seconds or use an alcohol-based hand rub.  Wash your [employer] [employer] your hands often and thoroughly with soap and water for at least 20 seconds. You can use an alcohol-based hand sanitizer if soap and water are not available and if your hands are not visibly dirty. Avoid touching your eyes, nose, and mouth with unwashed hands.   Prevention Steps for Caregivers and Household Members of Individuals Confirmed to have, or Being Evaluated for, COVID-19 Infection Being Cared for in the Home  If you live with, or provide care at home for, a person confirmed to have, or being evaluated for, COVID-19 infection please follow these guidelines to prevent infection:  Follow healthcare providers instructions Make sure that you understand and can help the patient follow any healthcare provider instructions for all care.  Provide for the  patients basic needs You should help the patient with basic needs in the home and provide support for getting groceries, prescriptions, and other personal needs.  Monitor the patients symptoms If they are getting sicker, call his or her medical provider and tell them that the patient has, or is  being evaluated for, COVID-19 infection. This will help the healthcare providers office take steps to keep other people from getting infected. Ask the healthcare provider to call the local or state health department.  Limit the number of people who have contact with the patient If possible, have only one caregiver for the patient. Other household members should stay in another home or place of residence. If this is not possible, they should stay in another room, or be separated from the patient as much as possible. Use a separate bathroom, if available. Restrict visitors who do not have an essential need to be in the home.  Keep older adults, very young children, and other sick people away from the patient Keep older adults, very young children, and those who have compromised immune systems or chronic health conditions away from the patient. This includes people with chronic heart, lung, or kidney conditions, diabetes, and cancer.  Ensure good ventilation Make sure that shared spaces in the home have good air flow, such as from an air conditioner or an opened window, weather permitting.  Wash your hands often Wash your hands often and thoroughly with soap and water for at least 20 seconds. You can use an alcohol based hand sanitizer if soap and water are not available and if your hands are not visibly dirty. Avoid touching your eyes, nose, and mouth with unwashed hands. Use disposable paper towels to dry your hands. If not available, use dedicated cloth towels and replace them when they become wet.  Wear a facemask and gloves Wear a disposable facemask at all times in the room and gloves when you touch or have contact with the patients blood, body fluids, and/or secretions or excretions, such as sweat, saliva, sputum, nasal mucus, vomit, urine, or feces.  Ensure the mask fits over your nose and mouth tightly, and do not touch it during use. Throw out disposable facemasks and gloves after  using them. Do not reuse. Wash your hands immediately after removing your facemask and gloves. If your personal clothing becomes contaminated, carefully remove clothing and launder. Wash your hands after handling contaminated clothing. Place all used disposable facemasks, gloves, and other waste in a lined container before disposing them with other household waste. Remove gloves and wash your hands immediately after handling these items.  Do not share dishes, glasses, or other household items with the patient Avoid sharing household items. You should not share dishes, drinking glasses, cups, eating utensils, towels, bedding, or other items with a patient who is confirmed to have, or being evaluated for, COVID-19 infection. After the person uses these items, you should wash them thoroughly with soap and water.  Wash laundry thoroughly Immediately remove and wash clothes or bedding that have blood, body fluids, and/or secretions or excretions, such as sweat, saliva, sputum, nasal mucus, vomit, urine, or feces, on them. Wear gloves when handling laundry from the patient. Read and follow directions on labels of laundry or clothing items and detergent. In general, wash and dry with the warmest temperatures recommended on the label.  Clean all areas the individual has used often Clean all touchable surfaces, such as counters, tabletops, doorknobs, bathroom fixtures, toilets, phones, keyboards, tablets, and bedside tables,  every day. Also, clean any surfaces that may have blood, body fluids, and/or secretions or excretions on them. Wear gloves when cleaning surfaces the patient has come in contact with. Use a diluted bleach solution (e.g., dilute bleach with 1 part bleach and 10 parts water) or a household disinfectant with a label that says EPA-registered for coronaviruses. To make a bleach solution at home, add 1 tablespoon of bleach to 1 quart (4 cups) of water. For a larger supply, add  cup of bleach  to 1 gallon (16 cups) of water. Read labels of cleaning products and follow recommendations provided on product labels. Labels contain instructions for safe and effective use of the cleaning product including precautions you should take when applying the product, such as wearing gloves or eye protection and making sure you have good ventilation during use of the product. Remove gloves and wash hands immediately after cleaning.  Monitor yourself for signs and symptoms of illness Caregivers and household members are considered close contacts, should monitor their health, and will be asked to limit movement outside of the home to the extent possible. Follow the monitoring steps for close contacts listed on the symptom monitoring form.   ? If you have additional questions, contact your local health department or call the epidemiologist on call at [phone] (available 24/7). ? This guidance is subject to change. For the most up-to-date guidance from [facility], please refer to their website: [email or web address]

## 2018-10-29 NOTE — ED Triage Notes (Addendum)
Reports to ER for shortness of breath.  Patient reports fever x 2 weeks, sore throat, headache, dry cough.  History of asthma.  Took ibuprofen and doxycycline this morning.  PCP prescribed doxycycline last week for possible sinus infection.

## 2018-10-29 NOTE — Progress Notes (Signed)
Virtual Visit via Video Note  Subjective  CC:  Chief Complaint  Patient presents with  . Possible COVID    Having on and off fevers, temp 100.1 at 10am, she taking IBU. She is having night sweats    HPI:  I connected with Julie Horn on 10/29/18 at 10:30 AM EDT by a video enabled telemedicine application and verified that I am speaking with the correct person using two identifiers. Location patient: Home Location provider: [employer], Office Persons participating in the virtual visit: Mashayla Horn, Leamon Arnt, MD Lilli Light, Lucky discussed the limitations of evaluation and management by telemedicine and the availability of in person appointments. The patient expressed understanding and agreed to proceed. . Please see most recent mychart messages. Persistent fevers, chills, sweat and SOB x 10 days now. Has been using inhaler q 4 hours. Reports she is not wheezing now but feels winded with little activity. Is on doxy, albuterol. Appetite is down. No GI sxs. Younger children had mild respiratory sxs but have recovered. Husband with body aches. She has been self isolating (with family for 10 days). She is a smoker.   Assessment  1. Suspected Covid-19 Virus Infection   2. Shortness of breath   3. Nicotine dependence with current use   4. Intermittent asthma, uncomplicated      Plan   Suspected Covid-19 with mild respiratory distress:  Spoke with Dr. Regenia Skeeter of [facility]; pt to go there for respiratory evaluation immediately. Can't assess via video: needs vitals, pulse ox, lung exam and perhaps chest xray to know best next step. Dr. Regenia Skeeter will happily evaluate her. I appreciate the help. Recommend husband drive her.  I discussed the assessment and treatment plan with the patient. The patient was provided an opportunity to ask questions and all were answered. The patient agreed with the plan and demonstrated an understanding of the  instructions.   The patient was advised to call back or seek an in-person evaluation if the symptoms worsen or if the condition fails to improve as anticipated. Follow up: pending eval  Visit date not found  No orders of the defined types were placed in this encounter.     I reviewed the patients updated PMH, FH, and SocHx.    Patient Active Problem List   Diagnosis Date Noted  . Nicotine dependence with current use 04/17/2018    Priority: High  . Abnormal Pap smear of cervix 04/17/2018    Priority: High  . Intermittent asthma, uncomplicated 84/16/6063    Priority: Medium  . GERD (gastroesophageal reflux disease) 04/17/2018    Priority: Medium  . History of migraine 04/17/2018    Priority: Medium  . Spondylolisthesis of lumbar region 04/17/2018    Priority: Medium  . 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 Fibroid uterus 04/17/2018    Priority: Medium   Current Meds  Medication Sig  . albuterol (PROVENTIL HFA;VENTOLIN HFA) 108 (90 Base) MCG/ACT inhaler Inhale 2 puffs into the lungs every 4 (four) hours as needed for wheezing or shortness of breath.  . B COMPLEX VITAMINS PO Take by mouth.  . Chromium Picolinate (CHROMIUM PICOLATE PO) Take 500 mg by mouth.  . doxycycline (VIBRA-TABS) 100 MG tablet Take 1 tablet (100 mg total) by mouth 2 (two) times daily for 7 days.  Marland Kitchen L-LYSINE PO Take 1,000 mg by mouth.  . montelukast (SINGULAIR) 10 MG tablet Take 1  tablet (10 mg total) by mouth at bedtime.  . Multiple Vitamin (MULTIVITAMIN) tablet Take 1 tablet by mouth daily.  Marland Kitchen OVER THE COUNTER MEDICATION 500 mg.  . promethazine-dextromethorphan (PROMETHAZINE-DM) 6.25-15 MG/5ML syrup Take 5 mLs by mouth 4 (four) times daily as needed.    Allergies: Patient is allergic to latex; levofloxacin; sulfa antibiotics; and azithromycin. Family History: Patient family history includes Asthma in her brother and mother; Cancer in her maternal  grandmother; Heart attack in her maternal grandfather. Social History:  Patient  reports that she has been smoking cigarettes. She has a 20.00 pack-year smoking history. She has never used smokeless tobacco. She reports current alcohol use. She reports previous drug use.  Review of Systems: Constitutional: Positive for fever and malaise. Has no energy Cardiovascular: negative for chest pain Respiratory: POSITIVE for SOB or persistent cough Gastrointestinal: negative for abdominal pain  OBJECTIVE Vitals: LMP 10/09/2018 , reported temp 100.1 at 10am today General: does not appear well, hard to tell on video but lips appear cyanotic,  Psych: Alert and oriented x 3 Respiratory: shortened sentences due to SOB; no obvious retractions, mild tachypnea  Leamon Arnt, MD

## 2018-10-29 NOTE — ED Provider Notes (Signed)
[facility] EMERGENCY DEPARTMENT Provider Note   CSN: [id number] Arrival date & time: 10/29/18  1206    History   Chief Complaint Chief Complaint  Patient presents with  . Shortness of Breath    HPI Julie Horn is a 43 y.o. female.     HPI  43 year old female presents for cough and shortness of breath.  Her PCP encouraged her to be seen in the ED.  She states it started off like an asthma exacerbation but has not gone away.  The wheezing is gone.  The cough has been dry and she has had shortness of breath whenever she does an activity or talks for a while.  She is also had on and off fevers.  She denies any current chest pain but had some chest pain when it first started.  No specific known contacts with a COVID-19 patient.  She states that she has been occasionally using albuterol.  She had a telephone visit with her PCP who told her that it sounded like she was short of breath when she was talking and she should come to the ED.  Patient states she does not feel that bad but does not feel great either.  She is currently on doxycycline.  Past Medical History:  Diagnosis Date  . Abnormal Pap smear of cervix 04/17/2018  . Asthma   . GERD (gastroesophageal reflux disease)   . History of migraine 04/17/2018   Had been managed on Topamax but had side effect. Now not active.  Marland Kitchen Spondylolisthesis of lumbar region 04/17/2018    Patient Active Problem List   Diagnosis Date Noted  . Nicotine dependence with current use 04/17/2018  . Intermittent asthma, uncomplicated 73/71/0626  . GERD (gastroesophageal reflux disease) 04/17/2018  . History of migraine 04/17/2018  . Spondylolisthesis of lumbar region 04/17/2018  . 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 Abnormal Pap smear of cervix 04/17/2018  . Fibroid uterus 04/17/2018    Past Surgical History:  Procedure Laterality Date  . LAPAROSCOPY    . TONSILLECTOMY AND ADENOIDECTOMY  1983     OB History    No obstetric history on file.      Home Medications    Prior to Admission medications   Medication Sig Start Date End Date Taking? Authorizing Provider  albuterol (PROVENTIL HFA;VENTOLIN HFA) 108 (90 Base) MCG/ACT inhaler Inhale 2 puffs into the lungs every 4 (four) hours as needed for wheezing or shortness of breath. 05/31/18   Leamon Arnt, MD  B COMPLEX VITAMINS PO Take by mouth.    [provider]  Chromium Picolinate (CHROMIUM PICOLATE PO) Take 500 mg by mouth.    [provider]  diazepam (VALIUM) 5 MG tablet Take one tablet 30 minutes prior to dental appt 05/01/18   Leamon Arnt, MD  doxycycline (VIBRA-TABS) 100 MG tablet Take 1 tablet (100 mg total) by mouth 2 (two) times daily for 7 days. 10/25/18 11/01/18  Leamon Arnt, MD  L-LYSINE PO Take 1,000 mg by mouth.    [provider]  montelukast (SINGULAIR) 10 MG tablet Take 1 tablet (10 mg total) by mouth at bedtime. 10/18/18   Leamon Arnt, MD  Multiple Vitamin (MULTIVITAMIN) tablet Take 1 tablet by mouth daily.    [provider]  OVER THE COUNTER MEDICATION 500 mg.    [provider]  promethazine-dextromethorphan (PROMETHAZINE-DM) 6.25-15 MG/5ML syrup Take 5 mLs by mouth 4 (four) times daily as needed. 10/25/18  Kennyth Arnold, FNP    Family History Family History  Problem Relation Age of Onset  . Asthma Mother   . Asthma Brother   . Cancer Maternal Grandmother   . Heart attack Maternal Grandfather     Social History Social History   Tobacco Use  . Smoking status: Current Every Day Smoker    Packs/day: 1.00    Years: 20.00    Pack years: 20.00    Types: Cigarettes  . Smokeless tobacco: Never Used  Substance Use Topics  . Alcohol use: Yes    Comment: occasionally   . Drug use: Not Currently     Allergies   Latex; Levofloxacin; Sulfa antibiotics; and Azithromycin   Review of Systems Review of Systems  Constitutional: Positive for fever.  Respiratory:  Positive for cough and shortness of breath. Negative for wheezing.   Cardiovascular: Negative for chest pain.  All other systems reviewed and are negative.    Physical Exam Updated Vital Signs BP (!) 136/100 (BP Location: Left Arm)   Pulse 76   Temp 99.4 F (37.4 C) (Oral)   Resp 11   Ht 5\' 4"  (1.626 m)   Wt 57.6 kg   LMP 10/09/2018   SpO2 100%   BMI 21.80 kg/m   Physical Exam Vitals signs and nursing note reviewed.  Constitutional:      General: She is not in acute distress.    Appearance: She is well-developed. She is not ill-appearing or diaphoretic.  HENT:     Head: Normocephalic and atraumatic.     Right Ear: External ear normal.     Left Ear: External ear normal.     Nose: Nose normal.  Eyes:     General:        Right eye: No discharge.        Left eye: No discharge.  Cardiovascular:     Rate and Rhythm: Normal rate and regular rhythm.  Pulmonary:     Effort: Pulmonary effort is normal. No tachypnea, accessory muscle usage or respiratory distress.     Comments: Speaks in complete sentences Occasional cough Abdominal:     Palpations: Abdomen is soft.     Tenderness: There is no abdominal tenderness.  Skin:    General: Skin is warm and dry.  Neurological:     Mental Status: She is alert.  Psychiatric:        Mood and Affect: Mood is not anxious.      ED Treatments / Results  Labs (all labs ordered are listed, but only abnormal results are displayed) Labs Reviewed - No data to display  EKG None  Radiology Dg Chest Portable 1 View  Result Date: 10/29/2018 CLINICAL DATA:  Shortness of breath.  Fever for 2 weeks.  Dry cough. EXAM: PORTABLE CHEST 1 VIEW COMPARISON:  None. FINDINGS: Normal heart, mediastinum and hila. Clear lungs.  No pleural effusion or pneumothorax. Skeletal structures are unremarkable. IMPRESSION: No active disease. Electronically Signed   By: Lajean Manes M.D.   On: 10/29/2018 13:12    Procedures Procedures (including critical  care time)  Medications Ordered in ED Medications - No data to display   Initial Impression / Assessment and Plan / ED Course  I have reviewed the triage vital signs and the nursing notes.  Pertinent labs & imaging results that were available during my care of the patient were reviewed by me and considered in my medical decision making (see chart for details).  Given the current pandemic, patient's prolonged respiratory illness is probably COVID-19.  However her respiratory rate is around 14, no increased work of breathing, no hypoxia, and no tachycardia.  She does not appear significantly ill.  Given all this, I do not think she needs to be admitted or require respiratory support.  I discussed the importance of self quarantine.  Chest x-ray without lobar pneumonia.  She will be discharged home with PCP follow-up and return precautions.  Julie Horn was evaluated in Emergency Department on 10/29/2018 for the symptoms described in the history of present illness. She was evaluated in the context of the global COVID-19 pandemic, which necessitated consideration that the patient might be at risk for infection with the SARS-CoV-2 virus that causes COVID-19. Institutional protocols and algorithms that pertain to the evaluation of patients at risk for COVID-19 are in a state of rapid change based on information released by regulatory bodies including the CDC and federal and state organizations. These policies and algorithms were followed during the patient's care in the ED.   Final Clinical Impressions(s) / ED Diagnoses   Final diagnoses:  Viral respiratory illness  Suspected Covid-19 Virus Infection    ED Discharge Orders    None       Sherwood Gambler, MD 10/29/18 1336

## 2018-11-05 ENCOUNTER — Encounter: Payer: Self-pay | Admitting: Family Medicine

## 2018-11-05 ENCOUNTER — Other Ambulatory Visit: Payer: Self-pay

## 2018-11-05 ENCOUNTER — Ambulatory Visit (INDEPENDENT_AMBULATORY_CARE_PROVIDER_SITE_OTHER): Payer: Commercial Managed Care - PPO | Admitting: Family Medicine

## 2018-11-05 VITALS — Temp 99.5°F | Wt 124.0 lb

## 2018-11-05 DIAGNOSIS — R509 Fever, unspecified: Secondary | ICD-10-CM | POA: Diagnosis not present

## 2018-11-05 DIAGNOSIS — Z20822 Contact with and (suspected) exposure to covid-19: Secondary | ICD-10-CM

## 2018-11-05 DIAGNOSIS — R6889 Other general symptoms and signs: Principal | ICD-10-CM

## 2018-11-05 NOTE — Progress Notes (Signed)
I have discussed the procedure for the virtual visit with the patient who has given consent to proceed with assessment and treatment.   Tiara S Simmons, CMA     

## 2018-11-05 NOTE — Progress Notes (Signed)
Virtual Visit via Video Note  Subjective  CC:  Chief Complaint  Patient presents with  . Follow-up  . Shortness of Breath  . Fever    still having fevers 99.5 this morning and has been taking IBU    HPI:  I connected with Julie Horn on 11/05/18 at 10:00 AM EDT by a video enabled telemedicine application and verified that I am speaking with the correct person using two identifiers. Location patient: Home Location provider: [employer], Office Persons participating in the virtual visit: Julie Horn, Leamon Arnt, MD Lilli Light, [place] discussed the limitations of evaluation and management by telemedicine and the availability of in person appointments. The patient expressed understanding and agreed to proceed. . 43 y.o. female presents for follow-up due to suspected COVID-19 infection.  She reports she still feels fatigued and does not feel well.  Still running fevers, T-max 99.5 yesterday morning.  This is week 3 of illness.  She was seen last week in the ER and had normal respiration and normal chest x-ray at that time.  She reports symptoms remain the same.  Feels fatigued and having hard time sleeping due to fevers but denies shortness of breath, wheezing.  Her cough is improving as well.  No GI symptoms.  She did report some paresthesias but she thinks this was related to the doxycycline which she has completed.  Other family members are improved. Assessment  1. Suspected Covid-19 Virus Infection      Plan A little 1 is  Suspected COVID-19: Continue supportive care.  Treat fevers with antipyretics.  Stay hydrated.  Continue self-isolation.  Follow-up if needed. I discussed the assessment and treatment plan with the patient. The patient was provided an opportunity to ask questions and all were answered. The patient agreed with the plan and demonstrated an understanding of the instructions.   The patient was advised to call back or seek an in-person  evaluation if the symptoms worsen or if the condition fails to improve as anticipated. Follow up: No follow-ups on file.  Visit date not found  No orders of the defined types were placed in this encounter.     I reviewed the patients updated PMH, FH, and SocHx.    Patient Active Problem List   Diagnosis Date Noted  . Nicotine dependence with current use 04/17/2018    Priority: High  . Abnormal Pap smear of cervix 04/17/2018    Priority: High  . Intermittent asthma, uncomplicated 75/64/3329    Priority: Medium  . GERD (gastroesophageal reflux disease) 04/17/2018    Priority: Medium  . History of migraine 04/17/2018    Priority: Medium  . Spondylolisthesis of lumbar region 04/17/2018    Priority: Medium  . 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 Fibroid uterus 04/17/2018    Priority: Medium   Current Meds  Medication Sig  . albuterol (PROVENTIL HFA;VENTOLIN HFA) 108 (90 Base) MCG/ACT inhaler Inhale 2 puffs into the lungs every 4 (four) hours as needed for wheezing or shortness of breath.  . B COMPLEX VITAMINS PO Take by mouth.  . Chromium Picolinate (CHROMIUM PICOLATE PO) Take 500 mg by mouth.  . L-LYSINE PO Take 1,000 mg by mouth.  . montelukast (SINGULAIR) 10 MG tablet Take 1 tablet (10 mg total) by mouth at bedtime.  . Multiple Vitamin (MULTIVITAMIN) tablet Take 1 tablet by mouth daily.  Marland Kitchen OVER THE COUNTER MEDICATION 500 mg.  Marland Kitchen  promethazine-dextromethorphan (PROMETHAZINE-DM) 6.25-15 MG/5ML syrup Take 5 mLs by mouth 4 (four) times daily as needed.    Allergies: Patient is allergic to latex; levofloxacin; sulfa antibiotics; and azithromycin. Family History: Patient family history includes Asthma in her brother and mother; Cancer in her maternal grandmother; Heart attack in her maternal grandfather. Social History:  Patient  reports that she has been smoking cigarettes. She has a 20.00 pack-year smoking history. She has never used  smokeless tobacco. She reports current alcohol use. She reports previous drug use.  Review of Systems: Constitutional: Negative for fever malaise or anorexia Cardiovascular: negative for chest pain Respiratory: negative for SOB or worsening cough  Gastrointestinal: negative for abdominal pain  OBJECTIVE Vitals: Temp 99.5 F (37.5 C) (Oral)   Wt 124 lb (56.2 kg)   LMP 10/09/2018   BMI 21.28 kg/m  General: no acute distress , A&Ox3, nontoxic appearing Respiratory: Mild tachypnea and halted speech persists  Leamon Arnt, MD

## 2018-11-08 ENCOUNTER — Encounter: Payer: Self-pay | Admitting: Family Medicine

## 2018-12-18 ENCOUNTER — Encounter: Payer: Self-pay | Admitting: Family Medicine

## 2018-12-19 ENCOUNTER — Encounter: Payer: Self-pay | Admitting: Family Medicine

## 2018-12-19 ENCOUNTER — Other Ambulatory Visit: Payer: Self-pay

## 2018-12-19 ENCOUNTER — Ambulatory Visit: Payer: Commercial Managed Care - PPO | Admitting: Family Medicine

## 2018-12-19 VITALS — BP 130/74 | HR 81 | Temp 99.1°F | Resp 16 | Ht 64.0 in | Wt 122.6 lb

## 2018-12-19 DIAGNOSIS — M4316 Spondylolisthesis, lumbar region: Secondary | ICD-10-CM | POA: Diagnosis not present

## 2018-12-19 DIAGNOSIS — S80861A Insect bite (nonvenomous), right lower leg, initial encounter: Secondary | ICD-10-CM | POA: Diagnosis not present

## 2018-12-19 DIAGNOSIS — W57XXXA Bitten or stung by nonvenomous insect and other nonvenomous arthropods, initial encounter: Secondary | ICD-10-CM

## 2018-12-19 MED ORDER — DOXYCYCLINE HYCLATE 100 MG PO TABS: 100.0000 mg | ORAL_TABLET | Freq: Two times a day (BID) | ORAL | 0 refills | Status: AC

## 2018-12-19 MED ORDER — FLUCONAZOLE 150 MG PO TABS: ORAL_TABLET | ORAL | 0 refills | Status: DC

## 2018-12-19 NOTE — Progress Notes (Signed)
Subjective  CC:  Chief Complaint  Patient presents with  . Tick Removal    Happened 2 1/2 weeks ago.. Back of right leg.. Area itches and she has used antibiotic cream, c/o of hip pain    HPI: Julie Horn is a 43 y.o. female who presents to the office today to address the problems listed above in the chief complaint.  43 year old noted a check in her right calf approximately 2 and half weeks ago.  She is quite certain that check was on for less than 12 hours.  It was not engorged when she removed it in its entirety.  It was tiny.  [place] and [place] several ticks each season.  She denies fevers, chills, red hot swollen joints or rash.  However, the tick bite site is red and was draining some pus.  It will not heal.  Nontender.  Not itching.  Bilateral gluteal pain for the last 4 days or so.  Worse with walking.  No radicular symptoms.  No lower extremity weakness.  No inguinal pain no lateral hip pain.  She does have chronic lumbar DJD.  She has been less active due to COVID restrictions.  She denies weakness.  Has not taken anything for pain.  Sleeps well at night. Assessment  1. Tick bite, infected, initial encounter   2. Spondylolisthesis of lumbar region      Plan   Infected tick bite: Doxycycline for a week for infection.  Reassured.  Routine wound care.  Gluteal/low back pain: Likely musculoskeletal in nature.  Could be bursitis.  Start NSAIDs x1 to 2 weeks.  Stretching exercises.  Follow-up if not improving.  No red flags today  Follow up: Return in about 4 months (around 04/21/2019) for complete physical.  Visit date not found  No orders of the defined types were placed in this encounter.  Meds ordered this encounter  Medications  . doxycycline (VIBRA-TABS) 100 MG tablet    Sig: Take 1 tablet (100 mg total) by mouth 2 (two) times daily for 7 days.    Dispense:  14 tablet    Refill:  0  . fluconazole (DIFLUCAN) 150 MG tablet    Sig: Take one tablet  today; may repeat in 3 days if symptoms persist    Dispense:  2 tablet    Refill:  0      I reviewed the patients updated PMH, FH, and SocHx.    Patient Active Problem List   Diagnosis Date Noted  . Nicotine dependence with current use 04/17/2018    Priority: High  . Abnormal Pap smear of cervix 04/17/2018    Priority: High  . Intermittent asthma, uncomplicated 85/46/2703    Priority: Medium  . GERD (gastroesophageal reflux disease) 04/17/2018    Priority: Medium  . History of migraine 04/17/2018    Priority: Medium  . Spondylolisthesis of lumbar region 04/17/2018    Priority: Medium  . 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 Fibroid uterus 04/17/2018    Priority: Medium   Current Meds  Medication Sig  . albuterol (PROVENTIL HFA;VENTOLIN HFA) 108 (90 Base) MCG/ACT inhaler Inhale 2 puffs into the lungs every 4 (four) hours as needed for wheezing or shortness of breath.  . B COMPLEX VITAMINS PO Take by mouth.  . Chromium Picolinate (CHROMIUM PICOLATE PO) Take 500 mg by mouth.  . L-LYSINE PO Take 1,000 mg by mouth.  . montelukast (SINGULAIR) 10  MG tablet Take 1 tablet (10 mg total) by mouth at bedtime.  . Multiple Vitamin (MULTIVITAMIN) tablet Take 1 tablet by mouth daily.  Marland Kitchen OVER THE COUNTER MEDICATION 500 mg.    Allergies: Patient is allergic to latex; levofloxacin; sulfa antibiotics; and azithromycin. Family History: Patient family history includes Asthma in her brother and mother; Cancer in her maternal grandmother; Heart attack in her maternal grandfather. Social History:  Patient  reports that she has been smoking cigarettes. She has a 20.00 pack-year smoking history. She has never used smokeless tobacco. She reports current alcohol use. She reports previous drug use.  Review of Systems: Constitutional: Negative for fever malaise or anorexia Cardiovascular: negative for chest pain Respiratory: negative for SOB or persistent  cough Gastrointestinal: negative for abdominal pain  Objective  Vitals: BP 130/74   Pulse 81   Temp 99.1 F (37.3 C) (Oral)   Resp 16   Ht 5\' 4"  (1.626 m)   Wt 122 lb 9.6 oz (55.6 kg)   SpO2 99%   BMI 21.04 kg/m  General: no acute distress , A&Ox3, appears well HEENT: PEERL, conjunctiva normal, Oropharynx moist,neck is supple Cardiovascular:  RRR without murmur or gallop.  Respiratory:  Good breath sounds bilaterally, CTAB with normal respiratory effort Skin:  Warm, right calf with 3 to 4 mm ulcerated erythematous wound.  No foreign body identified.  No surrounding erythema.  No warmth.  No drainage. Back: Full range of motion, bilateral gluteal tenderness near sciatic notch and greater tuberosities, negative straight leg raise bilaterally.  Normal gait.     Commons side effects, risks, benefits, and alternatives for medications and treatment plan prescribed today were discussed, and the patient expressed understanding of the given instructions. Patient is instructed to call or message via MyChart if he/she has any questions or concerns regarding our treatment plan. No barriers to understanding were identified. We discussed Red Flag symptoms and signs in detail. Patient expressed understanding regarding what to do in case of urgent or emergency type symptoms.   Medication list was reconciled, printed and provided to the patient in AVS. Patient instructions and summary information was reviewed with the patient as documented in the AVS. This note was prepared with assistance of Dragon voice recognition software. Occasional wrong-word or sound-a-like substitutions may have occurred due to the inherent limitations of voice recognition software

## 2018-12-19 NOTE — Patient Instructions (Signed)
Please return in September 2020 for your annual complete physical; please come fasting.   If you have any questions or concerns, please don't hesitate to send me a message via MyChart or call the office at [phone]. Thank you for visiting with us today! It's our pleasure caring for you.   

## 2019-04-03 ENCOUNTER — Other Ambulatory Visit: Payer: Self-pay | Admitting: *Deleted

## 2019-04-03 MED ORDER — ALBUTEROL SULFATE HFA 108 (90 BASE) MCG/ACT IN AERS: 2.0000 | INHALATION_SPRAY | RESPIRATORY_TRACT | 2 refills | Status: DC | PRN

## 2019-04-19 ENCOUNTER — Encounter: Payer: Commercial Managed Care - PPO | Admitting: Family Medicine

## 2019-04-26 ENCOUNTER — Ambulatory Visit (INDEPENDENT_AMBULATORY_CARE_PROVIDER_SITE_OTHER): Payer: Commercial Managed Care - PPO

## 2019-04-26 ENCOUNTER — Encounter: Payer: Self-pay | Admitting: Family Medicine

## 2019-04-26 ENCOUNTER — Ambulatory Visit (INDEPENDENT_AMBULATORY_CARE_PROVIDER_SITE_OTHER): Payer: Commercial Managed Care - PPO | Admitting: Family Medicine

## 2019-04-26 ENCOUNTER — Other Ambulatory Visit: Payer: Self-pay

## 2019-04-26 VITALS — BP 116/72 | HR 69 | Temp 98.3°F | Resp 16 | Ht 64.0 in | Wt 121.2 lb

## 2019-04-26 DIAGNOSIS — R87612 Low grade squamous intraepithelial lesion on cytologic smear of cervix (LGSIL): Secondary | ICD-10-CM | POA: Diagnosis not present

## 2019-04-26 DIAGNOSIS — Z8742 Personal history of other diseases of the female genital tract: Secondary | ICD-10-CM | POA: Diagnosis not present

## 2019-04-26 DIAGNOSIS — M79641 Pain in right hand: Secondary | ICD-10-CM | POA: Diagnosis not present

## 2019-04-26 DIAGNOSIS — M79642 Pain in left hand: Secondary | ICD-10-CM

## 2019-04-26 DIAGNOSIS — M47816 Spondylosis without myelopathy or radiculopathy, lumbar region: Secondary | ICD-10-CM

## 2019-04-26 DIAGNOSIS — I7300 Raynaud's syndrome without gangrene: Secondary | ICD-10-CM | POA: Insufficient documentation

## 2019-04-26 DIAGNOSIS — Z0000 Encounter for general adult medical examination without abnormal findings: Secondary | ICD-10-CM

## 2019-04-26 DIAGNOSIS — F17200 Nicotine dependence, unspecified, uncomplicated: Secondary | ICD-10-CM

## 2019-04-26 DIAGNOSIS — N926 Irregular menstruation, unspecified: Secondary | ICD-10-CM

## 2019-04-26 LAB — CBC WITH DIFFERENTIAL/PLATELET
Basophils Absolute: 0.1 10*3/uL (ref 0.0–0.1)
Basophils Relative: 0.8 % (ref 0.0–3.0)
Eosinophils Absolute: 0.3 10*3/uL (ref 0.0–0.7)
Eosinophils Relative: 5.6 % — ABNORMAL HIGH (ref 0.0–5.0)
HCT: 45.8 % (ref 36.0–46.0)
Hemoglobin: 15.6 g/dL — ABNORMAL HIGH (ref 12.0–15.0)
Lymphocytes Relative: 31 % (ref 12.0–46.0)
Lymphs Abs: 1.9 10*3/uL (ref 0.7–4.0)
MCHC: 34.1 g/dL (ref 30.0–36.0)
MCV: 98 fl (ref 78.0–100.0)
Monocytes Absolute: 0.4 10*3/uL (ref 0.1–1.0)
Monocytes Relative: 5.8 % (ref 3.0–12.0)
Neutro Abs: 3.5 10*3/uL (ref 1.4–7.7)
Neutrophils Relative %: 56.8 % (ref 43.0–77.0)
Platelets: 217 10*3/uL (ref 150.0–400.0)
RBC: 4.67 Mil/uL (ref 3.87–5.11)
RDW: 12.9 % (ref 11.5–15.5)
WBC: 6.2 10*3/uL (ref 4.0–10.5)

## 2019-04-26 LAB — LIPID PANEL
Cholesterol: 148 mg/dL (ref 0–200)
HDL: 57.7 mg/dL (ref >39.00)
LDL Cholesterol: 66 mg/dL (ref 0–99)
NonHDL: 89.94
Total CHOL/HDL Ratio: 3
Triglycerides: 122 mg/dL (ref 0.0–149.0)
VLDL: 24.4 mg/dL (ref 0.0–40.0)

## 2019-04-26 LAB — COMPREHENSIVE METABOLIC PANEL
ALT: 22 U/L (ref 0–35)
AST: 12 U/L (ref 0–37)
Albumin: 4.7 g/dL (ref 3.5–5.2)
Alkaline Phosphatase: 63 U/L (ref 39–117)
BUN: 6 mg/dL (ref 6–23)
CO2: 28 mEq/L (ref 19–32)
Calcium: 10.3 mg/dL (ref 8.4–10.5)
Chloride: 106 mEq/L (ref 96–112)
Creatinine, Ser: 0.67 mg/dL (ref 0.40–1.20)
GFR: 96.46 mL/min (ref >60.00)
Glucose, Bld: 86 mg/dL (ref 70–99)
Potassium: 4.6 mEq/L (ref 3.5–5.1)
Sodium: 143 mEq/L (ref 135–145)
Total Bilirubin: 0.4 mg/dL (ref 0.2–1.2)
Total Protein: 6.7 g/dL (ref 6.0–8.3)

## 2019-04-26 LAB — SEDIMENTATION RATE: Sed Rate: 2 mm/hr (ref 0–20)

## 2019-04-26 LAB — TSH: TSH: 1.52 u[IU]/mL (ref 0.35–4.50)

## 2019-04-26 LAB — URIC ACID: Uric Acid, Serum: 3.1 mg/dL (ref 2.4–7.0)

## 2019-04-26 MED ORDER — MELOXICAM 7.5 MG PO TABS: 7.5000 mg | ORAL_TABLET | Freq: Every day | ORAL | 5 refills | Status: DC

## 2019-04-26 NOTE — Progress Notes (Signed)
Subjective  Chief Complaint  Patient presents with  . Annual Exam    Has had banana    HPI: Julie Horn is a 43 y.o. female who presents to [facility] [place] today for a Female Wellness Visit.  She also has the concerns and/or needs as listed above in the chief complaint. These will be addressed in addition to the Health Maintenance Visit.   Wellness Visit: annual visit with health maintenance review and exam without Pap (pt prefers referral to GYN)   HM: not doing well due to worsening pain. See below. Due for pap. Last was in 2018: LSIL with neg HPV. Was referred to gyn last year but never was able to go. Never had colpo or repeat pap. Has never had mammogram. Very active lifestyle. Eats healthy diet. Declines flu shot.  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 However, now with worsening diffuse joint pains: hands, wrists, hips and knees. Stiff in the am; takes a while to get going. Goes all day long because is she sits/stops, she will stiffen up again. She denies radicular sxs or red hot swollen joints. There is a distant familial member with RA; mom has Drue Dun and chronic pain and h/o PMR; pt is very worried she will end up with chronic pain. C/o prox and distal joint pain in hands. Her brother had early DJD lumbar as well. Strong FH of OA.   Smoker: concerned about low bone density; has h/o abnl pap. Denies pulm sxs. Wants to quit and now has a plan. Wants to quit by the new year.   Raynauds, h/o migraines - neither active or bothersome.   ROS: negative for "double jointed" or stretchy skin. positve for irregular menses - skipping months. Chronically for several years now.   Assessment  1. Annual physical exam   2. LGSIL on Pap smear of cervix   3. Nicotine dependence with current use   4. History of abnormal cervical Pap smear   5. Pain in both hands   6. Spondylosis of lumbar  region without myelopathy or radiculopathy   7. Raynaud's phenomenon without gangrene      Plan  Female Wellness Visit:  Age appropriate Health Maintenance and Prevention measures were discussed with patient. Included topics are cancer screening recommendations, ways to keep healthy (see AVS) including dietary and exercise recommendations, regular eye and dental care, use of seat belts, and avoidance of moderate alcohol use and tobacco use. Pt to consider mammogram. Will discuss with gyn.   BMI: discussed patient's BMI and encouraged positive lifestyle modifications to help get to or maintain a target BMI.  HM needs and immunizations were addressed and ordered. See below for orders. See HM and immunization section for updates.  Routine labs and screening tests ordered including cmp, cbc and lipids where appropriate.  Discussed recommendations regarding Vit D and calcium supplementation (see AVS)  Chronic disease f/u and/or acute problem visit: (deemed necessary to be done in addition to the wellness visit):  abnl pap history w/o f/u:  Due for repeat pap to clarify. Referred to Gyn due to pt preference.   Joint pain:start eval; xray hands, lab work. Start mobic daily. Consider inflammatory arthropathies, OA, AS.  Smoking cessation discussed. Discussed association with cervical cancer and osteoporosis. Start vit d and calcium   Follow up: Return in about 6 months (around 10/25/2019) for recheck.   Orders Placed This Encounter  Procedures  .  DG Hand 2 View Left  . DG Hand 2 View Right  . CBC with Differential/Platelet  . Comprehensive metabolic panel  . Lipid panel  . TSH  . Sedimentation rate  . Uric acid  . Rheumatoid Factor  . Ambulatory referral to Obstetrics / Gynecology   Meds ordered this encounter  Medications  . meloxicam (MOBIC) 7.5 MG tablet    Sig: Take 1 tablet (7.5 mg total) by mouth daily.    Dispense:  30 tablet    Refill:  5      Lifestyle: Body mass  index is 20.8 kg/m. Wt Readings from Last 3 Encounters:  04/26/19 121 lb 3.2 oz (55 kg)  12/19/18 122 lb 9.6 oz (55.6 kg)  11/05/18 124 lb (56.2 kg)   Diet: low fat Exercise: daily, walking   Patient Active Problem List   Diagnosis Date Noted  . Nicotine dependence with current use 04/17/2018    Priority: High  . LGSIL on Pap smear of cervix 04/17/2018    Priority: High    lsil with HPV neg 2018.  Reports prior colpo.  No f/u   . Intermittent asthma, uncomplicated 0000000    Priority: Medium  . GERD (gastroesophageal reflux disease) 04/17/2018    Priority: Medium  . History of migraine 04/17/2018    Priority: Medium    Had been managed on Topamax but had side effect. Now not active.   Marland Kitchen Spondylolisthesis of lumbar region 04/17/2018    Priority: Medium  . 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 Fibroid uterus 04/17/2018    Priority: Medium  . Raynaud's phenomenon without gangrene 04/26/2019   Health Maintenance  Topic Date Due  . PAP SMEAR-Modifier  01/14/2020  . TETANUS/TDAP  07/25/2022  . HIV Screening  Completed  . INFLUENZA VACCINE  Discontinued    There is no immunization history on file for this patient. We updated and reviewed the patient's past history in detail and it is documented below. Allergies: Patient  reports current alcohol use. Past Medical History Patient  has a past medical history of Abnormal Pap smear of cervix (04/17/2018), Asthma, GERD (gastroesophageal reflux disease), History of migraine (04/17/2018), and Spondylolisthesis of lumbar region (04/17/2018). Past Surgical History Patient  has a past surgical history that includes Tonsillectomy and adenoidectomy (1983) and laparoscopy. Social History   Socioeconomic History  . Marital status: Married    Spouse name: Not on file  . Number of children: 1  . Years of education: Not on file  . Highest education level: Not on file  Occupational  History  . 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 Financial resource strain: Not on file  . Food insecurity    Worry: Not on file    Inability: Not on file  . Transportation needs    Medical: Not on file    Non-medical: Not on file  Tobacco Use  . Smoking status: Current Every Day Smoker    Packs/day: 1.00    Years: 20.00    Pack years: 20.00    Types: Cigarettes  . Smokeless tobacco: Never Used  . Tobacco comment: plans to quit by 2020 year end  Substance and Sexual Activity  . Alcohol use: Yes    Comment: occasionally   . Drug use: Not Currently  . 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 infertility  Lifestyle  .  Physical activity    Days per week: Not on file    Minutes per session: Not on file  . Stress: Not on file  Relationships  . Social Herbalist on phone: Not on file    Gets together: Not on file    Attends religious service: Not on file    Active member of club or organization: Not on file    Attends meetings of clubs or organizations: Not on file    Relationship status: Not on file  Other Topics Concern  . Not on file  Social History Narrative  . Not on file   Family History  Problem Relation Age of Onset  . Asthma Mother   . Polymyalgia rheumatica Mother   . Asthma Brother   . Arthritis Brother   . Cancer Maternal Grandmother   . Heart attack Maternal Grandfather     Review of Systems: Constitutional: negative for fever or malaise Ophthalmic: negative for photophobia, double vision or loss of vision Cardiovascular: negative for chest pain, dyspnea on exertion, or new LE swelling Respiratory: negative for SOB or persistent cough Gastrointestinal: negative for abdominal pain, change in bowel habits or melena Genitourinary: negative for dysuria or gross hematuria, no abnormal uterine bleeding or disharge Musculoskeletal: negative for muscular  weakness Integumentary: negative for new or persistent rashes, no breast lumps Neurological: negative for TIA or stroke symptoms Psychiatric: negative for SI or delusions Allergic/Immunologic: negative for hives  Patient Care Team    Relationship Specialty Notifications Start End  Leamon Arnt, MD PCP - General Family Medicine  04/17/18     Objective  Vitals: BP 116/72   Pulse 69   Temp 98.3 F (36.8 C) (Tympanic)   Resp 16   Ht 5\' 4"  (1.626 m)   Wt 121 lb 3.2 oz (55 kg)   SpO2 99%   BMI 20.80 kg/m  General:  Well developed, well nourished, no acute distress but appears tired and worried Psych:  Alert and orientedx3,normal mood and affect HEENT:  Normocephalic, atraumatic, non-icteric sclera, PERRL, oropharynx is clear without mass or exudate, supple neck without adenopathy, mass or thyromegaly Cardiovascular:  Normal S1, S2, RRR without gallop, rub or murmur, nondisplaced PMI Respiratory:  Good breath sounds bilaterally, CTAB with normal respiratory effort Gastrointestinal: normal bowel sounds, soft, non-tender, no noted masses. No HSM MSK: no  contusions. Joints are without erythema or warmth; mcp, PIP and DIP on bilateral hands "tender". Spine and CVA region are nontender Skin:  Warm, no rashes or suspicious lesions noted Neurologic:    Mental status is normal. CN 2-11 are normal. Gross motor and sensory exams are normal. Normal gait. No tremor Breast Exam: No mass, skin retraction or nipple discharge is appreciated in either breast. No axillary adenopathy. Fibrocystic changes are not noted     Commons side effects, risks, benefits, and alternatives for medications and treatment plan prescribed today were discussed, and the patient expressed understanding of the given instructions. Patient is instructed to call or message via MyChart if he/she has any questions or concerns regarding our treatment plan. No barriers to understanding were identified. We discussed Red Flag  symptoms and signs in detail. Patient expressed understanding regarding what to do in case of urgent or emergency type symptoms.   Medication list was reconciled, printed and provided to the patient in AVS. Patient instructions and summary information was reviewed with the patient as documented in the AVS. This note was prepared with assistance of Dragon voice recognition  software. Occasional wrong-word or sound-a-like substitutions may have occurred due to the inherent limitations of voice recognition software

## 2019-04-26 NOTE — Patient Instructions (Signed)
Please return in 6 months for recheck.   Please start the daily meloxicam; i'll let you know if you need to see ortho or rheum for further evaluation pending the xray and lab test results.  Please stop smoking; this is important.   I will release your lab results to you on your MyChart account with further instructions. Please reply with any questions.   We will call you with information regarding your referral appointment. GYN for your pap smear and female exam. You may discuss starting mammograms as well.  If you do not hear from us within the next 2 weeks, please let me know. It can take 1-2 weeks to get appointments set up with the specialists.   If you have any questions or concerns, please don't hesitate to send me a message via MyChart or call the office at [phone]. Thank you for visiting with us today! It's our pleasure caring for you.   Preventive Care 40-64 Years Old, Female Preventive care refers to visits with your health care provider and lifestyle choices that can promote health and wellness. This includes:  A yearly physical exam. This may also be called an annual well check.  Regular dental visits and eye exams.  Immunizations.  Screening for certain conditions.  Healthy lifestyle choices, such as eating a healthy diet, getting regular exercise, not using drugs or products that contain nicotine and tobacco, and limiting alcohol use. What can I expect for my preventive care visit? Physical exam Your health care provider will check your:  Height and weight. This may be used to calculate body mass index (BMI), which tells if you are at a healthy weight.  Heart rate and blood pressure.  Skin for abnormal spots. Counseling Your health care provider may ask you questions about your:  Alcohol, tobacco, and drug use.  Emotional well-being.  Home and relationship well-being.  Sexual activity.  Eating habits.  Work and work environment.  Method of birth  control.  Menstrual cycle.  Pregnancy history. What immunizations do I need?  Influenza (flu) vaccine  This is recommended every year. Tetanus, diphtheria, and pertussis (Tdap) vaccine  You may need a Td booster every 10 years. Varicella (chickenpox) vaccine  You may need this if you have not been vaccinated. Zoster (shingles) vaccine  You may need this after age 60. Measles, mumps, and rubella (MMR) vaccine  You may need at least one dose of MMR if you were born in 1957 or later. You may also need a second dose. Pneumococcal conjugate (PCV13) vaccine  You may need this if you have certain conditions and were not previously vaccinated. Pneumococcal polysaccharide (PPSV23) vaccine  You may need one or two doses if you smoke cigarettes or if you have certain conditions. Meningococcal conjugate (MenACWY) vaccine  You may need this if you have certain conditions. Hepatitis A vaccine  You may need this if you have certain conditions or if you travel or work in places where you may be exposed to hepatitis A. Hepatitis B vaccine  You may need this if you have certain conditions or if you travel or work in places where you may be exposed to hepatitis B. Haemophilus influenzae type b (Hib) vaccine  You may need this if you have certain conditions. Human papillomavirus (HPV) vaccine  If recommended by your health care provider, you may need three doses over 6 months. You may receive vaccines as individual doses or as more than one vaccine together in one shot (combination vaccines). Talk   with your health care provider about the risks and benefits of combination vaccines. What tests do I need? Blood tests  Lipid and cholesterol levels. These may be checked every 5 years, or more frequently if you are over 50 years old.  Hepatitis C test.  Hepatitis B test. Screening  Lung cancer screening. You may have this screening every year starting at age 55 if you have a 30-pack-year  history of smoking and currently smoke or have quit within the past 15 years.  Colorectal cancer screening. All adults should have this screening starting at age 50 and continuing until age 75. Your health care provider may recommend screening at age 45 if you are at increased risk. You will have tests every 1-10 years, depending on your results and the type of screening test.  Diabetes screening. This is done by checking your blood sugar (glucose) after you have not eaten for a while (fasting). You may have this done every 1-3 years.  Mammogram. This may be done every 1-2 years. Talk with your health care provider about when you should start having regular mammograms. This may depend on whether you have a family history of breast cancer.  BRCA-related cancer screening. This may be done if you have a family history of breast, ovarian, tubal, or peritoneal cancers.  Pelvic exam and Pap test. This may be done every 3 years starting at age 21. Starting at age 30, this may be done every 5 years if you have a Pap test in combination with an HPV test. Other tests  Sexually transmitted disease (STD) testing.  Bone density scan. This is done to screen for osteoporosis. You may have this scan if you are at high risk for osteoporosis. Follow these instructions at home: Eating and drinking  Eat a diet that includes fresh fruits and vegetables, whole grains, lean protein, and low-fat dairy.  Take vitamin and mineral supplements as recommended by your health care provider.  Do not drink alcohol if: ? Your health care provider tells you not to drink. ? You are pregnant, may be pregnant, or are planning to become pregnant.  If you drink alcohol: ? Limit how much you have to 0-1 drink a day. ? Be aware of how much alcohol is in your drink. In the [place] one drink equals one 12 oz bottle of beer (355 mL), one 5 oz glass of wine (148 mL), or one 1 oz glass of hard liquor (44 mL). Lifestyle  Take daily  care of your teeth and gums.  Stay active. Exercise for at least 30 minutes on 5 or more days each week.  Do not use any products that contain nicotine or tobacco, such as cigarettes, e-cigarettes, and chewing tobacco. If you need help quitting, ask your health care provider.  If you are sexually active, practice safe sex. Use a condom or other form of birth control (contraception) in order to prevent pregnancy and STIs (sexually transmitted infections).  If told by your health care provider, take low-dose aspirin daily starting at age 50. What's next?  Visit your health care provider once a year for a well check visit.  Ask your health care provider how often you should have your eyes and teeth checked.  Stay up to date on all vaccines. This information is not intended to replace advice given to you by your health care provider. Make sure you discuss any questions you have with your health care provider. Document Released: 08/07/2015 Document Revised: 03/22/2018 Document Reviewed: 03/22/2018   Elsevier Patient Education  [employer].

## 2019-04-27 ENCOUNTER — Encounter: Payer: Self-pay | Admitting: Family Medicine

## 2019-04-27 DIAGNOSIS — M47816 Spondylosis without myelopathy or radiculopathy, lumbar region: Secondary | ICD-10-CM

## 2019-04-27 DIAGNOSIS — M79641 Pain in right hand: Secondary | ICD-10-CM

## 2019-04-27 DIAGNOSIS — M79642 Pain in left hand: Secondary | ICD-10-CM

## 2019-04-27 DIAGNOSIS — M2550 Pain in unspecified joint: Secondary | ICD-10-CM

## 2019-04-27 LAB — RHEUMATOID FACTOR: Rheumatoid fact SerPl-aCnc: <14 IU/mL (ref <14)

## 2019-05-07 ENCOUNTER — Other Ambulatory Visit: Payer: Self-pay

## 2019-05-07 ENCOUNTER — Ambulatory Visit (INDEPENDENT_AMBULATORY_CARE_PROVIDER_SITE_OTHER): Payer: Commercial Managed Care - PPO

## 2019-05-07 ENCOUNTER — Ambulatory Visit: Payer: Commercial Managed Care - PPO | Admitting: Orthopaedic Surgery

## 2019-05-07 ENCOUNTER — Encounter: Payer: Self-pay | Admitting: Orthopaedic Surgery

## 2019-05-07 VITALS — BP 133/82 | HR 77 | Ht 64.0 in | Wt 122.0 lb

## 2019-05-07 DIAGNOSIS — M4317 Spondylolisthesis, lumbosacral region: Secondary | ICD-10-CM

## 2019-05-07 DIAGNOSIS — M545 Low back pain: Secondary | ICD-10-CM

## 2019-05-07 DIAGNOSIS — G8929 Other chronic pain: Secondary | ICD-10-CM

## 2019-05-07 DIAGNOSIS — M79642 Pain in left hand: Secondary | ICD-10-CM | POA: Insufficient documentation

## 2019-05-07 DIAGNOSIS — M79641 Pain in right hand: Secondary | ICD-10-CM | POA: Insufficient documentation

## 2019-05-07 MED ORDER — METHOCARBAMOL 500 MG PO TABS: ORAL_TABLET | ORAL | 0 refills | Status: DC

## 2019-05-07 NOTE — Progress Notes (Signed)
Office Visit Note   Patient: Julie Horn           Date of Birth: 09/19/1975           MRN: [id number] Visit Date: 05/07/2019              Requested by: Leamon Arnt, [address] [place],  [place] [id number] PCP: Leamon Arnt, MD   Assessment & Plan: Visit Diagnoses:  1. Chronic bilateral low back pain, unspecified whether sciatica present     Plan: Chronic low back pain over more than 10 years.  Will obtain MRI scan.  Has taken Advil, Mobic and exercises with persistent pain.  No radiculopathy.  We will also order Robaxin  Bilateral hand pain and multiple joint arthralgias might be related to a rheumatologic issue.  Have discussed with Dr. Estanislado Pandy and will order Additional lab work.  We will follow up with Dr. Estanislado Pandy Follow-Up Instructions: Return Follow-up with Dr. Estanislado Pandy.   Orders:  Orders Placed This Encounter  Procedures  . XR Lumbar Spine 2-3 Views   No orders of the defined types were placed in this encounter.     Procedures: No procedures performed   Clinical Data: No additional findings.   Subjective: Chief Complaint  Patient presents with  . Right Hand - Pain  . Left Hand - Pain  . Lower Back - Pain  Patient presents today for bilateral hand pain and lower back pain. Patient states that both hands have bothering her for a couple months. She said that she has difficulty with holding things and writing. No known injury. Both bother her equally. She said that they both swell and feel stiff. She is right hand dominant. She had x-rays of her hands on 04/26/2019 with her PCP.  Those films were reviewed without evidence of any abnormality.  Also had sed rate and RA factor which were negative.  Has a positive history of Raynaud's.  Positive family history of Sjogren's.  Has had multiple tick bites in the past and more recently in May 2020. She has complaints of bilateral leg pain for months.Her pain is located in her buttock and travels down  to her knees. She states that her hamstrings are very tight bilaterally. She has a history of lower back issues and has been diagnosed with spondylolisthesis. She has not any previous back surgery.  She has difficulty sleeping.  Long history of back problems dating over 10 years.  Had prior MRI scan probably 8 to 9 years ago with degenerative disc disease and possibly spondylolisthesis according to the patient.  Has tried physical therapy.  Recently has tried a combination of Mobic and Advil with persistent pain.  Because of her back and her multiple joint arthralgias she really is compromised in her activities.  She has difficulty sitting for a length of time.  No specific skin rashes.  HPI  Review of Systems  Constitutional: Negative for fatigue.  HENT: Negative for ear pain.   Eyes: Negative for pain.  Respiratory: Negative for shortness of breath.   Cardiovascular: Negative for leg swelling.  Gastrointestinal: Negative for constipation and diarrhea.  Endocrine: Positive for cold intolerance and heat intolerance.  Genitourinary: Negative for difficulty urinating.  Musculoskeletal: Negative for joint swelling.  Skin: Negative for rash.  Allergic/Immunologic: Negative for food allergies.  Neurological: Positive for weakness.  Hematological: Does not bruise/bleed easily.  Psychiatric/Behavioral: Positive for sleep disturbance.     Objective: Vital Signs: BP 133/82   Pulse  77   Ht 5\' 4"  (1.626 m)   Wt 122 lb (55.3 kg)   BMI 20.94 kg/m   Physical Exam Constitutional:      Appearance: She is well-developed.  Eyes:     Pupils: Pupils are equal, round, and reactive to light.  Pulmonary:     Effort: Pulmonary effort is normal.  Skin:    General: Skin is warm and dry.  Neurological:     Mental Status: She is alert and oriented to person, place, and time.  Psychiatric:        Behavior: Behavior normal.     Ortho Exam no obvious deformities of either hand.  May be mild  swelling.  Has difficulty making a full fist.  Neurologically intact.  No Tinel's over the median of the ulnar nerve.  No pain at the base of the thumb.  Skin appears to be intact.  Her graph hamstrings are definitely tight.  No pain with range of motion of either hip.  Straight leg raise negative for back pain.  Some percussible tenderness of lower lumbar spine.  Pelvis level Specialty Comments:  No specialty comments available.  Imaging: Xr Lumbar Spine 2-3 Views  Result Date: 05/07/2019 Lumbar spine 10 2 projections.  There is incomplete fusion of the spinous process of L5.  Very minimal facet arthropathy at L5-S1.  Disc spaces appear to be well-maintained.  No scoliosis.  No listhesis    PMFS History: Patient Active Problem List   Diagnosis Date Noted  . Bilateral hand pain 05/07/2019  . Raynaud's phenomenon without gangrene 04/26/2019  . Irregular menses 04/26/2019  . Nicotine dependence with current use 04/17/2018  . Intermittent asthma, uncomplicated 0000000  . GERD (gastroesophageal reflux disease) 04/17/2018  . History of migraine 04/17/2018  . Spondylolisthesis of lumbar region 04/17/2018  . 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 LGSIL on Pap smear of cervix 04/17/2018  . Fibroid uterus 04/17/2018   Past Medical History:  Diagnosis Date  . Abnormal Pap smear of cervix 04/17/2018  . Asthma   . GERD (gastroesophageal reflux disease)   . History of migraine 04/17/2018   Had been managed on Topamax but had side effect. Now not active.  Marland Kitchen Spondylolisthesis of lumbar region 04/17/2018    Family History  Problem Relation Age of Onset  . Asthma Mother   . Polymyalgia rheumatica Mother   . Ehlers-Danlos syndrome Mother   . Asthma Brother   . Arthritis Brother   . Cancer Maternal Grandmother   . Heart attack Maternal Grandfather     Past Surgical History:  Procedure Laterality Date  . LAPAROSCOPY    . TONSILLECTOMY AND ADENOIDECTOMY  1983    Social History   Occupational History  . 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 Smoking status: Current Every Day Smoker    Packs/day: 1.00    Years: 20.00    Pack years: 20.00    Types: Cigarettes  . Smokeless tobacco: Never Used  . Tobacco comment: plans to quit by 2020 year end  Substance and Sexual Activity  . Alcohol use: Yes    Comment: occasionally   . Drug use: Not Currently  . 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 infertility

## 2019-05-07 NOTE — Addendum Note (Signed)
Addended by: Lendon Collar on: 05/07/2019 11:19 AM   Modules accepted: Orders

## 2019-05-10 NOTE — Progress Notes (Deleted)
Office Visit Note  Patient: Julie Horn             Date of Birth: July 31, 1975           MRN: [id number]             PCP: Leamon Arnt, MD Referring: Leamon Arnt, MD Visit Date: 05/24/2019 Occupation: @GUAROCC @  Subjective:  No chief complaint on file.   History of Present Illness: Julie Horn is a 43 y.o. female ***   Activities of Daily Living:  Patient reports morning stiffness for *** {minute/hour:19697}.   Patient {ACTIONS;DENIES/REPORTS:21021675::"Denies"} nocturnal pain.  Difficulty dressing/grooming: {ACTIONS;DENIES/REPORTS:21021675::"Denies"} Difficulty climbing stairs: {ACTIONS;DENIES/REPORTS:21021675::"Denies"} Difficulty getting out of chair: {ACTIONS;DENIES/REPORTS:21021675::"Denies"} Difficulty using hands for taps, buttons, cutlery, and/or writing: {ACTIONS;DENIES/REPORTS:21021675::"Denies"}  No Rheumatology ROS completed.   PMFS History:  Patient Active Problem List   Diagnosis Date Noted  . Bilateral hand pain 05/07/2019  . Raynaud's phenomenon without gangrene 04/26/2019  . Irregular menses 04/26/2019  . Nicotine dependence with current use 04/17/2018  . Intermittent asthma, uncomplicated 0000000  . GERD (gastroesophageal reflux disease) 04/17/2018  . History of migraine 04/17/2018  . Spondylolisthesis of lumbar region 04/17/2018  . 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 LGSIL on Pap smear of cervix 04/17/2018  . Fibroid uterus 04/17/2018    Past Medical History:  Diagnosis Date  . Abnormal Pap smear of cervix 04/17/2018  . Asthma   . GERD (gastroesophageal reflux disease)   . History of migraine 04/17/2018   Had been managed on Topamax but had side effect. Now not active.  Marland Kitchen Spondylolisthesis of lumbar region 04/17/2018    Family History  Problem Relation Age of Onset  . Asthma Mother   . Polymyalgia rheumatica Mother   . Ehlers-Danlos syndrome Mother   . Asthma Brother   . Arthritis Brother   . Cancer  Maternal Grandmother   . Heart attack Maternal Grandfather    Past Surgical History:  Procedure Laterality Date  . LAPAROSCOPY    . TONSILLECTOMY AND ADENOIDECTOMY  33   Social History   Social History Narrative  . Not on file    There is no immunization history on file for this patient.   Objective: Vital Signs: There were no vitals taken for this visit.   Physical Exam   Musculoskeletal Exam: ***  CDAI Exam: CDAI Score: - Patient Global: -; Provider Global: - Swollen: -; Tender: - Joint Exam   No joint exam has been documented for this visit   There is currently no information documented on the homunculus. Go to the Rheumatology activity and complete the homunculus joint exam.  Investigation: Findings:  04/26/19: TSH 1.52, sed rate 2, uric acid 3.1, RF<14 05/07/19: ANA-, ENA-, ACE 22, anticardiolipin antibody negative, beta-2 negative, complements WNL, B burgodorferi Ab-  Component     Latest Ref Rng & Units 04/26/2019 05/07/2019  Anticardiolipin Ab,IgA,Qn     APL  <11  Anticardiolipin Ab,IgG,Qn     GPL  <14  Anticardiolipin Ab,IgM,Qn     MPL  <12  Beta-2 Glycoprotein I Ab, IgG     < OR = 20 SGU  <9  Beta-2 Glyco 1 IgM     < OR = 20 SMU  9  Beta-2 Glyco 1 IgA     < OR = 20 SAU  <9  C3 Complement     83 - 193 mg/dL  95  C4 Complement     15 - 57 mg/dL  20  TSH     0.35 - 4.50 uIU/mL 1.52   Sed Rate     0 - 20 mm/hr 2   Uric Acid, Serum     2.4 - 7.0 mg/dL 3.1   RA Latex Turbid.     <14 IU/mL <14   Anti Nuclear Antibody (ANA)     NEGATIVE  NEGATIVE  Angiotensin-Converting Enzyme     9 - 67 U/L  22  Scleroderma (Scl-70) (ENA) Antibody, IgG     <1.0 NEG AI  <1.0 NEG  Ribonucleic Protein(ENA) Antibody, IgG     <1.0 NEG AI  <1.0 NEG  ENA SM Ab Ser-aCnc     <1.0 NEG AI  <1.0 NEG  SSA (Ro) (ENA) Antibody, IgG     <1.0 NEG AI  <1.0 NEG  SSB (La) (ENA) Antibody, IgG     <1.0 NEG AI  <1.0 NEG  ds DNA Ab     IU/mL  1  B burgdorferi Ab IgG+IgM      index  <0.90   Imaging: Dg Hand 2 View Right  Result Date: 04/26/2019 CLINICAL DATA:  Joint pain EXAM: LEFT HAND - 2 VIEW; RIGHT HAND - 2 VIEW COMPARISON:  None. FINDINGS: No fracture or dislocation of the bilateral hands. Joint spaces are well preserved. Soft tissues are unremarkable. IMPRESSION: No fracture or dislocation of the bilateral hands. Joint spaces are well preserved. No radiographic findings to explain pain. Electronically Signed   By: Eddie Candle M.D.   On: 04/26/2019 14:06   Dg Hand 2 View Left  Result Date: 04/26/2019 CLINICAL DATA:  Joint pain EXAM: LEFT HAND - 2 VIEW; RIGHT HAND - 2 VIEW COMPARISON:  None. FINDINGS: No fracture or dislocation of the bilateral hands. Joint spaces are well preserved. Soft tissues are unremarkable. IMPRESSION: No fracture or dislocation of the bilateral hands. Joint spaces are well preserved. No radiographic findings to explain pain. Electronically Signed   By: Eddie Candle M.D.   On: 04/26/2019 14:06   Xr Lumbar Spine 2-3 Views  Result Date: 05/07/2019 Lumbar spine 10 2 projections.  There is incomplete fusion of the spinous process of L5.  Very minimal facet arthropathy at L5-S1.  Disc spaces appear to be well-maintained.  No scoliosis.  No listhesis   Recent Labs: Lab Results  Component Value Date   WBC 6.2 04/26/2019   HGB 15.6 (H) 04/26/2019   PLT 217.0 04/26/2019   NA 143 04/26/2019   K 4.6 04/26/2019   CL 106 04/26/2019   CO2 28 04/26/2019   GLUCOSE 86 04/26/2019   BUN 6 04/26/2019   CREATININE 0.67 04/26/2019   BILITOT 0.4 04/26/2019   ALKPHOS 63 04/26/2019   AST 12 04/26/2019   ALT 22 04/26/2019   PROT 6.7 04/26/2019   ALBUMIN 4.7 04/26/2019   CALCIUM 10.3 04/26/2019    Speciality Comments: No specialty comments available.  Procedures:  No procedures performed Allergies: Latex, Levofloxacin, Sulfa antibiotics, and Azithromycin   Assessment / Plan:     Visit Diagnoses: No diagnosis found.  Orders: No orders of  the defined types were placed in this encounter.  No orders of the defined types were placed in this encounter.   Face-to-face time spent with patient was *** minutes. Greater than 50% of time was spent in counseling and coordination of care.  Follow-Up Instructions: No follow-ups on file.   Ofilia Neas, PA-C  Note - This record has been created using Dragon software.  Chart creation errors have been  sought, but may not always  have been located. Such creation errors do not reflect on  the standard of medical care.

## 2019-05-14 LAB — B. BURGDORFI ANTIBODIES: B burgdorferi Ab IgG+IgM: <0.9 index

## 2019-05-14 LAB — LUPUS ANTICOAGULANT EVAL W/ REFLEX
PTT-LA Screen: 36 s (ref <=40)
dRVVT: 39 s (ref <=45)

## 2019-05-14 LAB — RNP ANTIBODY: Ribonucleic Protein(ENA) Antibody, IgG: <1 AI

## 2019-05-14 LAB — ANGIOTENSIN CONVERTING ENZYME: Angiotensin-Converting Enzyme: 22 U/L (ref 9–67)

## 2019-05-14 LAB — SJOGRENS SYNDROME-B EXTRACTABLE NUCLEAR ANTIBODY: SSB (La) (ENA) Antibody, IgG: <1 AI

## 2019-05-14 LAB — CARDIOLIPIN ANTIBODIES, IGG, IGM, IGA
Anticardiolipin IgA: <11 [APL'U]
Anticardiolipin IgG: <14 [GPL'U]
Anticardiolipin IgM: <12 [MPL'U]

## 2019-05-14 LAB — ANA: Anti Nuclear Antibody (ANA): NEGATIVE

## 2019-05-14 LAB — C3 AND C4
C3 Complement: 95 mg/dL (ref 83–193)
C4 Complement: 20 mg/dL (ref 15–57)

## 2019-05-14 LAB — BETA-2 GLYCOPROTEIN ANTIBODIES
Beta-2 Glyco 1 IgA: <9 SAU (ref <=20)
Beta-2 Glyco 1 IgM: 9 SMU (ref <=20)
Beta-2 Glyco I IgG: <9 SGU (ref <=20)

## 2019-05-14 LAB — ANTI-DNA ANTIBODY, DOUBLE-STRANDED: ds DNA Ab: 1 IU/mL

## 2019-05-14 LAB — ANTI-SMITH ANTIBODY: ENA SM Ab Ser-aCnc: <1 AI

## 2019-05-14 LAB — CRYOGLOBULIN: Cryoglobulin, Qualitative Analysis: NOT DETECTED

## 2019-05-14 LAB — ANTI-SCLERODERMA ANTIBODY: Scleroderma (Scl-70) (ENA) Antibody, IgG: <1 AI

## 2019-05-14 LAB — SJOGRENS SYNDROME-A EXTRACTABLE NUCLEAR ANTIBODY: SSA (Ro) (ENA) Antibody, IgG: <1 AI

## 2019-05-22 ENCOUNTER — Other Ambulatory Visit: Payer: Commercial Managed Care - PPO

## 2019-05-24 ENCOUNTER — Ambulatory Visit: Payer: Commercial Managed Care - PPO | Admitting: Rheumatology

## 2019-06-06 ENCOUNTER — Ambulatory Visit: Payer: Commercial Managed Care - PPO | Admitting: Rheumatology

## 2019-08-08 ENCOUNTER — Encounter: Payer: Self-pay | Admitting: Family Medicine

## 2019-08-12 ENCOUNTER — Other Ambulatory Visit: Payer: Self-pay

## 2019-08-12 ENCOUNTER — Encounter: Payer: Self-pay | Admitting: Family Medicine

## 2019-08-12 MED ORDER — MONTELUKAST SODIUM 10 MG PO TABS: 10.0000 mg | ORAL_TABLET | Freq: Every day | ORAL | 3 refills | Status: DC

## 2019-10-29 ENCOUNTER — Ambulatory Visit: Payer: Commercial Managed Care - PPO | Admitting: Family Medicine

## 2019-10-29 DIAGNOSIS — Z0289 Encounter for other administrative examinations: Secondary | ICD-10-CM

## 2019-11-20 ENCOUNTER — Encounter: Payer: Self-pay | Admitting: Family Medicine

## 2020-01-02 DIAGNOSIS — H44001 Unspecified purulent endophthalmitis, right eye: Secondary | ICD-10-CM | POA: Diagnosis not present

## 2020-01-02 DIAGNOSIS — T3695XA Adverse effect of unspecified systemic antibiotic, initial encounter: Secondary | ICD-10-CM | POA: Diagnosis not present

## 2020-01-02 DIAGNOSIS — B379 Candidiasis, unspecified: Secondary | ICD-10-CM | POA: Diagnosis not present

## 2020-01-08 ENCOUNTER — Encounter: Payer: Self-pay | Admitting: Family Medicine

## 2020-01-08 ENCOUNTER — Other Ambulatory Visit: Payer: Self-pay

## 2020-01-08 MED ORDER — FLUCONAZOLE 150 MG PO TABS: 150.0000 mg | ORAL_TABLET | Freq: Once | ORAL | 0 refills | Status: AC

## 2020-01-08 NOTE — Telephone Encounter (Signed)
Ok to refill If not improving, then needs visit for testing. thanks

## 2020-02-10 DIAGNOSIS — H66002 Acute suppurative otitis media without spontaneous rupture of ear drum, left ear: Secondary | ICD-10-CM | POA: Diagnosis not present

## 2020-02-10 DIAGNOSIS — B379 Candidiasis, unspecified: Secondary | ICD-10-CM | POA: Diagnosis not present

## 2020-02-10 DIAGNOSIS — T3695XA Adverse effect of unspecified systemic antibiotic, initial encounter: Secondary | ICD-10-CM | POA: Diagnosis not present

## 2020-02-26 ENCOUNTER — Other Ambulatory Visit: Payer: Self-pay

## 2020-02-26 ENCOUNTER — Telehealth: Payer: Self-pay | Admitting: Family Medicine

## 2020-02-26 ENCOUNTER — Encounter: Payer: Self-pay | Admitting: Family Medicine

## 2020-02-26 ENCOUNTER — Ambulatory Visit (INDEPENDENT_AMBULATORY_CARE_PROVIDER_SITE_OTHER)
Admission: RE | Admit: 2020-02-26 | Discharge: 2020-02-26 | Disposition: A | Discharge status: Home / Self Care | Payer: BC Managed Care – PPO | Source: Ambulatory Visit | Attending: Family Medicine | Admitting: Family Medicine

## 2020-02-26 ENCOUNTER — Ambulatory Visit (INDEPENDENT_AMBULATORY_CARE_PROVIDER_SITE_OTHER): Payer: BC Managed Care – PPO | Admitting: Family Medicine

## 2020-02-26 VITALS — BP 98/60 | HR 75 | Temp 97.7°F | Ht 64.0 in | Wt 121.2 lb

## 2020-02-26 DIAGNOSIS — M79644 Pain in right finger(s): Secondary | ICD-10-CM | POA: Diagnosis not present

## 2020-02-26 DIAGNOSIS — M7989 Other specified soft tissue disorders: Secondary | ICD-10-CM | POA: Diagnosis not present

## 2020-02-26 DIAGNOSIS — S6010XA Contusion of unspecified finger with damage to nail, initial encounter: Secondary | ICD-10-CM | POA: Diagnosis not present

## 2020-02-26 DIAGNOSIS — S6990XA Unspecified injury of unspecified wrist, hand and finger(s), initial encounter: Secondary | ICD-10-CM

## 2020-02-26 DIAGNOSIS — S6991XA Unspecified injury of right wrist, hand and finger(s), initial encounter: Secondary | ICD-10-CM | POA: Diagnosis not present

## 2020-02-26 MED ORDER — TRAMADOL HCL 50 MG PO TABS: 50.0000 mg | ORAL_TABLET | Freq: Three times a day (TID) | ORAL | 0 refills | Status: AC | PRN

## 2020-02-26 NOTE — Telephone Encounter (Signed)
SEEN DR.WOLFE TODAY   Nurse Assessment Nurse: Claiborne Billings, RN, Kim Date/Time (Eastern Time): 02/26/2020 7:07:15 AM Confirm and document reason for call. If symptomatic, describe symptoms. ---Caller states she smashed her thumb on her rt hand yesterday, has a lot of blood under the fingernail. States she has a lot of pain and couldn't sleep last night. Has the patient had close contact with a person known or suspected to have the novel coronavirus illness OR traveled / lives in area with major community spread (including international travel) in the last 14 days from the onset of symptoms? * If Asymptomatic, screen for exposure and travel within the last 14 days. ---No Does the patient have any new or worsening symptoms? ---Yes Will a triage be completed? ---Yes Related visit to physician within the last 2 weeks? ---Yes Does the PT have any chronic conditions? (i.e. diabetes, asthma, this includes High risk factors for pregnancy, etc.) ---No Is the patient pregnant or possibly pregnant? (Ask all females between the ages of 3-55) ---No Is this a behavioral health or substance abuse call? ---No Guidelines Guideline Title Affirmed Question Affirmed Notes Nurse Date/Time (Eastern Time) Finger Injury [1] SEVERE pain AND [2] not improved 2 hours Claiborne Billings, RN, Maudie Mercury 02/26/2020 7:09:19 AMPLEASE NOTE: All timestamps contained within this report are represented as [place] Standard Time. CONFIDENTIALTY NOTICE: This fax transmission is intended only for the addressee. It contains information that is legally privileged, confidential or otherwise protected from use or disclosure. If you are not the intended recipient, you are strictly prohibited from reviewing, disclosing, copying using or disseminating any of this information or taking any action in reliance on or regarding this information. If you have received this fax in error, please notify us immediately by telephone so that we can arrange for its return to  [place]. Phone: [phone], Toll-Free: [phone], Fax: [phone] Page: 2 of 2 Call Id: [id number] Guidelines Guideline Title Affirmed Question Affirmed Notes Nurse Date/Time Eilene Ghazi Time) after pain medicine/ice packs Disp. Time Eilene Ghazi Time) Disposition Final User 02/26/2020 7:12:23 AM See HCP within 4 Hours (or PCP triage) Yes Claiborne Billings, RN, Max Sane Disagree/Comply Comply Caller Understands Yes PreDisposition Did not know what to do Care Advice Given Per Guideline SEE HCP WITHIN 4 HOURS (OR PCP TRIAGE): * IF OFFICE WILL BE OPEN: You need to be seen within the next 3 or 4 hours. Call your doctor (or NP/PA) now or as soon as the office opens. CALL BACK IF: * You become worse. CARE ADVICE given per Finger Injury (Adult) guideline. Comments User: Suezanne Jacquet, RN Date/Time Eilene Ghazi Time): 02/26/2020 7:13:58 AM Caller states she will call office at 8am to see if she can be seen within 4 hr time frame. States her copay for urgent care is $100 and she can't afford it. Referrals GO TO FACILITY UNDECIDE

## 2020-02-26 NOTE — Patient Instructions (Addendum)
I have ordered xrays for you. At this time we do not have xrays in our clinic. You will have to go to our [place] clinic. The address is [address]  xray is located in the basement.  Hours of operation are M-F 8:30am to 5:00pm.  Closed for lunch between 12:30 and 1:00pm.    -if pain gets worse, swelling worse: ER -after xray i'll give you a call!   Hematoma A hematoma is a collection of blood. A hematoma can happen:  Under the skin.  In an organ.  In a body space.  In a joint space.  In other tissues. The blood can thicken (clot) to form a lump that you can see and feel. The lump is often hard and may become sore and tender. The lump can be very small or very big. Most hematomas get better in a few days to weeks. However, some hematomas may be serious and need medical care. What are the causes? This condition is caused by:  An injury.  Blood that leaks under the skin.  Problems from surgeries.  Medical conditions that cause bleeding or bruising. What increases the risk? You are more likely to develop this condition if:  You are an older adult.  You use medicines that thin your blood. What are the signs or symptoms? Symptoms depend on where the hematoma is in your body.  If the hematoma is under the skin, there is: ? A firm lump on the body. ? Pain and tenderness in the area. ? Bruising. The skin above the lump may be blue, dark blue, purple-red, or yellowish.  If the hematoma is deep in the tissues or body spaces, there may be: ? Blood in the stomach. This may cause pain in the belly (abdomen), weakness, passing out (fainting), and shortness of breath. ? Blood in the head. This may cause a headache, weakness, trouble speaking or understanding speech, or passing out. How is this diagnosed? This condition is diagnosed based on:  Your medical history.  A physical exam.  Imaging tests, such as ultrasound or CT scan.  Blood tests. How is this treated? Treatment  depends on the cause, size, and location of the hematoma. Treatment may include:  Doing nothing. Many hematomas go away on their own without treatment.  Surgery or close monitoring. This may be needed for large hematomas or hematomas that affect the body's organs.  Medicines. These may be given if a medical condition caused the hematoma. Follow these instructions at home: Managing pain, stiffness, and swelling   If told, put ice on the area. ? Put ice in a plastic bag. ? Place a towel between your skin and the bag. ? Leave the ice on for 20 minutes, 2-3 times a day for the first two days.  If told, put heat on the affected area after putting ice on the area for two days. Use the heat source that your doctor tells you to use. This could be a moist heat pack or a heating pad. To do this: ? Place a towel between your skin and the heat source. ? Leave the heat on for 20-30 minutes. ? Remove the heat if your skin turns bright red. This is very important if you are unable to feel pain, heat, or cold. You may have a greater risk of getting burned.  Raise (elevate) the affected area above the level of your heart while you are sitting or lying down.  Wrap the affected area with an  elastic bandage, if told by your doctor. Do not wrap the bandage too tightly.  If your hematoma is on a leg or foot and is painful, your doctor may give you crutches. Use them as told by your doctor. General instructions  Take over-the-counter and prescription medicines only as told by your doctor.  Keep all follow-up visits as told by your doctor. This is important. Contact a doctor if:  You have a fever.  The swelling or bruising gets worse.  You start to get more hematomas. Get help right away if:  Your pain gets worse.  Your pain is not getting better with medicine.  Your skin over the hematoma breaks or starts to bleed.  Your hematoma is in your chest or belly and you: ? Pass out. ? Feel  weak. ? Become short of breath.  You have a hematoma on your scalp that is caused by a fall or injury, and you: ? Have a headache that gets worse. ? Have trouble speaking or understanding speech. ? Become less alert or you pass out. Summary  A hematoma is a collection of blood in any part of your body.  Most hematomas get better on their own in a few days to weeks. Some may need medical care.  Follow instructions from your doctor about how to care for your hematoma.  Contact a doctor if the swelling or bruising gets worse, or if you are short of breath. This information is not intended to replace advice given to you by your health care provider. Make sure you discuss any questions you have with your health care provider. Document Revised: 12/14/2017 Document Reviewed: 12/14/2017 Elsevier Patient Education  2020 [employer].

## 2020-02-26 NOTE — Progress Notes (Signed)
Patient: Julie Horn MRN: [id number] DOB: 07-30-1975 PCP: Leamon Arnt, MD     Subjective:  Chief Complaint  Patient presents with  . Thumb injury    HPI: The patient is a 44 y.o. female who presents today for a thumb injury on her right hand. She says that she slammed it in the car door last night. She states she got the tip of her right thumb in the door. It immediately got swollen and has been throbbing. It bled in the nail, but not outside of this. She is able to move it. She has taken ibuprofen and tylenol, as well as applied ice to it. She says that she did not sleep good due to throbbing. She is right handed. Pain is rated as a 7/10. Nothing really has made it feel better. Large bruise under nail bed. No open skin/drainage.   UTD on tetanus shot.   Review of Systems  Constitutional: Negative for fatigue and fever.  Musculoskeletal: Negative for joint swelling.  Neurological: Negative for dizziness and headaches.    Allergies Patient is allergic to latex, levofloxacin, sulfa antibiotics, and azithromycin.  Past Medical History Patient  has a past medical history of Abnormal Pap smear of cervix (04/17/2018), Asthma, GERD (gastroesophageal reflux disease), History of migraine (04/17/2018), and Spondylolisthesis of lumbar region (04/17/2018).  Surgical History Patient  has a past surgical history that includes Tonsillectomy and adenoidectomy (1983) and laparoscopy.  Family History Pateint's family history includes Arthritis in her brother; Asthma in her brother and mother; Cancer in her maternal grandmother; Ehlers-Danlos syndrome in her mother; Heart attack in her maternal grandfather; Polymyalgia rheumatica in her mother.  Social History Patient  reports that she has been smoking cigarettes. She has a 20.00 pack-year smoking history. She has never used smokeless tobacco. She reports current alcohol use. She reports previous drug use.    Objective: Vitals:   02/26/20  0948  BP: 98/60  Pulse: 75  Temp: 97.7 F (36.5 C)  TempSrc: Temporal  SpO2: 98%  Weight: 121 lb 3.2 oz (55 kg)  Height: 5\' 4"  (1.626 m)    Body mass index is 20.8 kg/m.  Physical Exam Vitals reviewed.  Constitutional:      Appearance: Normal appearance. She is normal weight.  HENT:     Head: Normocephalic and atraumatic.  Musculoskeletal:        General: Swelling and tenderness present.     Comments: Right thumb: large hematoma under nail bed. Simple/contained. No open skin. Full ROM in her thumb. Vascularly intact. Edema in tip of thumb.   Neurological:     Mental Status: She is alert.      Thumb xray: no acute fracture seen by my read. Official read pending.   Procedure consent Verbal consent obtained for trephination wit bovi. Area cleaned. bovi applied, but patient couldn't not tolerate. She states she got scared. Attempted heated paperclip, but did not make much progress. She didn't want to continue. Aborted procedure.     Assessment/plan: 1. Thumb injury, initial encounter No fracture seen. utd on tdap. Will f/u on official read.  - DG Finger Thumb Right; Future  2. Subungual hematoma of digit of hand, initial encounter Attempted trephination but failed. Discussed will have very slow absorption. Tramadol prn for severe pain. Keep hand elevated, ice. If pain continues or gets worse recommend ER.    This visit occurred during the SARS-CoV-2 public health emergency.  Safety protocols were in place, including screening questions prior to the visit, additional  usage of staff PPE, and extensive cleaning of exam room while observing appropriate contact time as indicated for disinfecting solutions.      Return if symptoms worsen or fail to improve.   Orma Flaming, MD [place]   02/26/2020

## 2020-02-27 ENCOUNTER — Encounter: Payer: Self-pay | Admitting: Family Medicine

## 2020-02-27 MED ORDER — FLUCONAZOLE 150 MG PO TABS
150.0000 mg | ORAL_TABLET | Freq: Once | ORAL | 0 refills | Status: AC
Start: 2020-02-27 — End: 2020-02-27

## 2020-02-27 MED ORDER — CEPHALEXIN 500 MG PO CAPS: 500.0000 mg | ORAL_CAPSULE | Freq: Three times a day (TID) | ORAL | 0 refills | Status: DC

## 2020-07-01 ENCOUNTER — Other Ambulatory Visit: Payer: Self-pay | Admitting: Orthopaedic Surgery

## 2020-07-01 DIAGNOSIS — M4317 Spondylolisthesis, lumbosacral region: Secondary | ICD-10-CM

## 2020-07-24 DIAGNOSIS — Z20822 Contact with and (suspected) exposure to covid-19: Secondary | ICD-10-CM | POA: Diagnosis not present

## 2020-08-13 DIAGNOSIS — Z20822 Contact with and (suspected) exposure to covid-19: Secondary | ICD-10-CM | POA: Diagnosis not present

## 2020-08-16 ENCOUNTER — Telehealth: Payer: BC Managed Care – PPO | Admitting: Family

## 2020-08-16 DIAGNOSIS — J069 Acute upper respiratory infection, unspecified: Secondary | ICD-10-CM

## 2020-08-16 MED ORDER — PROMETHAZINE-DM 6.25-15 MG/5ML PO SYRP: 5.0000 mL | ORAL_SOLUTION | Freq: Four times a day (QID) | ORAL | 0 refills | Status: DC | PRN

## 2020-08-16 MED ORDER — FLUTICASONE PROPIONATE 50 MCG/ACT NA SUSP: 2.0000 | Freq: Every day | NASAL | 0 refills | Status: DC

## 2020-08-16 NOTE — Progress Notes (Signed)
We are sorry you are not feeling well.  Here is how we plan to help!  Based on what you have shared with me, it looks like you may have a viral upper respiratory infection.  Upper respiratory infections are caused by a large number of viruses; however, rhinovirus is the most common cause.   Symptoms vary from person to person, with common symptoms including sore throat, cough, fatigue or lack of energy and feeling of general discomfort.  A low-grade fever of up to 100.4 may present, but is often uncommon.  Symptoms vary however, and are closely related to a person's age or underlying illnesses.  The most common symptoms associated with an upper respiratory infection are nasal discharge or congestion, cough, sneezing, headache and pressure in the ears and face.  These symptoms usually persist for about 3 to 10 days, but can last up to 2 weeks.  It is important to know that upper respiratory infections do not cause serious illness or complications in most cases.    Upper respiratory infections can be transmitted from person to person, with the most common method of transmission being a person's hands.  The virus is able to live on the skin and can infect other persons for up to 2 hours after direct contact.  Also, these can be transmitted when someone coughs or sneezes; thus, it is important to cover the mouth to reduce this risk.  To keep the spread of the illness at [place], good hand hygiene is very important.  This is an infection that is most likely caused by a virus. There are no specific treatments other than to help you with the symptoms until the infection runs its course.  We are sorry you are not feeling well.  Here is how we plan to help!   For nasal congestion, you may use an oral decongestants such as Mucinex D or if you have glaucoma or high blood pressure use plain Mucinex.  Saline nasal spray or nasal drops can help and can safely be used as often as needed for congestion.  For your congestion,  I have prescribed Fluticasone nasal spray one spray in each nostril twice a day.   If you do not have a history of heart disease, hypertension, diabetes or thyroid disease, prostate/bladder issues or glaucoma, you may also use Sudafed to treat nasal congestion.  It is highly recommended that you consult with a pharmacist or your primary care physician to ensure this medication is safe for you to take.     If you have a cough, you may use cough suppressants such as Delsym and Robitussin.  If you have glaucoma or high blood pressure, you can also use Coricidin HBP.   For cough I have prescribed for you Promethazine DM for cough  If you have a sore or scratchy throat, use a saltwater gargle-  to  teaspoon of salt dissolved in a 4-ounce to 8-ounce glass of warm water.  Gargle the solution for approximately 15-30 seconds and then spit.  It is important not to swallow the solution.  You can also use throat lozenges/cough drops and Chloraseptic spray to help with throat pain or discomfort.  Warm or cold liquids can also be helpful in relieving throat pain.  For headache, pain or general discomfort, you can use Ibuprofen or Tylenol as directed.   Some authorities believe that zinc sprays or the use of Echinacea may shorten the course of your symptoms.   HOME CARE . Only take medications  as instructed by your medical team. . Be sure to drink plenty of fluids. Water is fine as well as fruit juices, sodas and electrolyte beverages. You may want to stay away from caffeine or alcohol. If you are nauseated, try taking small sips of liquids. How do you know if you are getting enough fluid? Your urine should be a pale yellow or almost colorless. . Get rest. . Taking a steamy shower or using a humidifier may help nasal congestion and ease sore throat pain. You can place a towel over your head and breathe in the steam from hot water coming from a faucet. . Using a saline nasal spray works much the same  way. . Cough drops, hard candies and sore throat lozenges may ease your cough. . Avoid close contacts especially the very young and the elderly . Cover your mouth if you cough or sneeze . Always remember to wash your hands.   GET HELP RIGHT AWAY IF: . You develop worsening fever. . If your symptoms do not improve within 10 days . You develop yellow or green discharge from your nose over 3 days. . You have coughing fits . You develop a severe head ache or visual changes. . You develop shortness of breath, difficulty breathing or start having chest pain . Your symptoms persist after you have completed your treatment plan  MAKE SURE YOU   Understand these instructions.  Will watch your condition.  Will get help right away if you are not doing well or get worse.  Your e-visit answers were reviewed by a board certified advanced clinical practitioner to complete your personal care plan. Depending upon the condition, your plan could have included both over the counter or prescription medications. Please review your pharmacy choice. If there is a problem, you may call our nursing hot line at and have the prescription routed to another pharmacy. Your safety is important to [place]. If you have drug allergies check your prescription carefully.   You can use MyChart to ask questions about today's visit, request a non-urgent call back, or ask for a work or school excuse for 24 hours related to this e-Visit. If it has been greater than 24 hours you will need to follow up with your provider, or enter a new e-Visit to address those concerns. You will get an e-mail in the next two days asking about your experience.  I hope that your e-visit has been valuable and will speed your recovery. Thank you for using e-visits.  Greater than 5 minutes, yet less than 10 minutes of time have been spent researching, coordinating, and implementing care for this patient today.  Thank you for the details you included in  the comment boxes. Those details are very helpful in determining the best course of treatment for you and help [place] to provide the best care.

## 2021-07-06 ENCOUNTER — Encounter: Payer: Self-pay | Admitting: Family Medicine

## 2021-07-06 ENCOUNTER — Ambulatory Visit (INDEPENDENT_AMBULATORY_CARE_PROVIDER_SITE_OTHER): Payer: BC Managed Care – PPO | Admitting: Family Medicine

## 2021-07-06 VITALS — BP 115/83 | HR 77 | Ht 64.0 in | Wt 126.0 lb

## 2021-07-06 DIAGNOSIS — Z1231 Encounter for screening mammogram for malignant neoplasm of breast: Secondary | ICD-10-CM | POA: Diagnosis not present

## 2021-07-06 DIAGNOSIS — Z0000 Encounter for general adult medical examination without abnormal findings: Secondary | ICD-10-CM

## 2021-07-06 DIAGNOSIS — R928 Other abnormal and inconclusive findings on diagnostic imaging of breast: Secondary | ICD-10-CM

## 2021-07-06 DIAGNOSIS — Z803 Family history of malignant neoplasm of breast: Secondary | ICD-10-CM

## 2021-07-06 DIAGNOSIS — R87612 Low grade squamous intraepithelial lesion on cytologic smear of cervix (LGSIL): Secondary | ICD-10-CM

## 2021-07-06 DIAGNOSIS — E559 Vitamin D deficiency, unspecified: Secondary | ICD-10-CM

## 2021-07-06 DIAGNOSIS — F418 Other specified anxiety disorders: Secondary | ICD-10-CM

## 2021-07-06 DIAGNOSIS — J4520 Mild intermittent asthma, uncomplicated: Secondary | ICD-10-CM | POA: Diagnosis not present

## 2021-07-06 DIAGNOSIS — Z0001 Encounter for general adult medical examination with abnormal findings: Secondary | ICD-10-CM

## 2021-07-06 MED ORDER — HYDROXYZINE HCL 10 MG PO TABS: 10.0000 mg | ORAL_TABLET | Freq: Three times a day (TID) | ORAL | 4 refills | Status: DC | PRN

## 2021-07-06 MED ORDER — MONTELUKAST SODIUM 10 MG PO TABS
10.0000 mg | ORAL_TABLET | Freq: Every day | ORAL | 3 refills | Status: DC
Start: 2021-07-06 — End: 2023-05-22

## 2021-07-06 MED ORDER — ALBUTEROL SULFATE HFA 108 (90 BASE) MCG/ACT IN AERS: 2.0000 | INHALATION_SPRAY | RESPIRATORY_TRACT | 2 refills | Status: DC | PRN

## 2021-07-06 NOTE — Progress Notes (Signed)
Subjective:  Patient ID: Julie Horn, female    DOB: 1976/06/19, 45 y.o.   MRN: [id number]  Patient Care Team: Baruch Gouty, FNP as PCP - General (Family Medicine) Garald Balding, MD as Consulting Physician (Orthopedic Surgery) Bo Merino, MD as Consulting Physician (Rheumatology)   Chief Complaint:  physical exam   HPI: Julie Horn is a 45 y.o. female presenting on 07/06/2021 for physical exam   Pt presents today to establish care with new PCP and for annual physical exam. She has not been seen by a PCP in over a year states her mother was recently diagnosed with stage 4 metastatic breast cancer and pt felt it was time for her to have a physical. Her last PAP was abnormal and she has not had follow up PAP as scheduled. Would like referral to GYN. She has not had a mammogram and would like to complete. She states she has been very anxious due to her mother's diagnosis. She states she has taken Atarax in the past to help control acute anxiety and this has worked well. She states other medications make her feel funny and she does not like to take them. She has intermittent asthma and postnasal drip which is well controlled with Singulair and as needed Albuterol inhaler. She has Raynaud's and treats this symptomatically. She is on Vit D repletion therapy as she has been deficient in the past. She has arthralgias that worsen if she has a Raynaud's episode.    Relevant past medical, surgical, family, and social history reviewed and updated as indicated.  Allergies and medications reviewed and updated. Data reviewed: Chart in Epic.   Past Medical History:  Diagnosis Date   Abnormal Pap smear of cervix 04/17/2018   Asthma    GERD (gastroesophageal reflux disease)    History of migraine 04/17/2018   Had been managed on Topamax but had side effect. Now not active.   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 infertility  Other Topics Concern   Not on file  Social History Narrative   Not on file   Social Determinants of Health   Financial Resource Strain: Not on file  Food Insecurity: Not on file  Transportation Needs: Not on file  Physical Activity: Not on file  Stress: Not on file  Social Connections: Not on file  Intimate Partner Violence: Not on file    Outpatient Encounter Medications  as of 07/06/2021  Medication Sig   B COMPLEX VITAMINS PO Take by mouth.   fluticasone (FLONASE) 50 MCG/ACT nasal spray Place 2 sprays into both nostrils daily.   hydrOXYzine (ATARAX) 10 MG tablet Take 1 tablet (10 mg total) by mouth 3 (three) times daily as needed.   Multiple Vitamin (MULTIVITAMIN) tablet Take 1 tablet by mouth daily.   OVER THE COUNTER MEDICATION 500 mg.   Vitamins A & D (VITAMIN A & D PO) Take by mouth.   [DISCONTINUED] albuterol (VENTOLIN HFA) 108 (90 Base) MCG/ACT inhaler Inhale 2 puffs into the lungs every 4 (four) hours as needed for wheezing or shortness of breath.   [DISCONTINUED] cephALEXin (KEFLEX) 500 MG capsule Take 1 capsule (500 mg total) by mouth 3 (three) times  daily.   [DISCONTINUED] cetirizine (ZYRTEC) 10 MG tablet Take by mouth.   [DISCONTINUED] Chromium Picolinate (CHROMIUM PICOLATE PO) Take 500 mg by mouth.   [DISCONTINUED] L-LYSINE PO Take 1,000 mg by mouth.   [DISCONTINUED] meloxicam (MOBIC) 7.5 MG tablet Take 1 tablet (7.5 mg total) by mouth daily.   [DISCONTINUED] methocarbamol (ROBAXIN) 500 MG tablet 1 tablet PO BID prn   [DISCONTINUED] Misc Natural Products (GLUCOSAMINE CHONDROITIN ADV PO) Take by mouth.   [DISCONTINUED] montelukast (SINGULAIR) 10 MG tablet Take 1 tablet (10 mg total) by mouth at bedtime.   [DISCONTINUED] promethazine-dextromethorphan (PROMETHAZINE-DM) 6.25-15 MG/5ML syrup Take 5 mLs by mouth 4 (four) times daily as needed.   albuterol (VENTOLIN HFA) 108 (90 Base) MCG/ACT inhaler Inhale 2 puffs into the lungs every 4 (four) hours as needed for wheezing or shortness of breath.   montelukast (SINGULAIR) 10 MG tablet Take 1 tablet (10 mg total) by mouth at bedtime.   No facility-administered encounter medications on file as of 07/06/2021.    Allergies  Allergen Reactions   Latex    Levofloxacin    Sulfa Antibiotics    Azithromycin Rash   Doxycycline Rash    Review of Systems  Constitutional:  Negative for activity change, appetite change, chills, diaphoresis, fatigue, fever and unexpected weight change.  HENT:  Positive for postnasal drip. Negative for congestion, dental problem, drooling, ear discharge, ear pain, facial swelling, hearing loss, mouth sores, nosebleeds, rhinorrhea, sinus pressure, sinus pain, sneezing, sore throat, tinnitus, trouble swallowing and voice change.   Eyes: Negative.  Negative for photophobia and visual disturbance.  Respiratory:  Positive for wheezing. Negative for apnea, cough, choking, chest tightness, shortness of breath and stridor.   Cardiovascular:  Negative for chest pain, palpitations and leg swelling.  Gastrointestinal:  Negative for abdominal distention, abdominal pain, anal  bleeding, blood in stool, constipation, diarrhea, nausea, rectal pain and vomiting.  Endocrine: Negative.   Genitourinary:  Negative for decreased urine volume, difficulty urinating, dysuria, frequency, urgency, vaginal bleeding, vaginal discharge and vaginal pain.  Musculoskeletal:  Positive for arthralgias. Negative for myalgias.  Skin: Negative.   Allergic/Immunologic: Negative.   Neurological:  Negative for dizziness, tremors, seizures, syncope, facial asymmetry, speech difficulty, weakness, light-headedness, numbness and headaches.  Hematological: Negative.   Psychiatric/Behavioral:  Negative for agitation, behavioral problems, confusion, decreased concentration, dysphoric mood, hallucinations, self-injury, sleep disturbance and suicidal ideas. The patient is nervous/anxious. The patient is not hyperactive.   All other systems reviewed and are negative.      Objective:  BP 115/83    Pulse 77    Ht 5' 4"  (1.626 m)    Wt 126 lb (57.2 kg)    SpO2 98%    BMI 21.63 kg/m  Wt Readings from Last 3 Encounters:  07/06/21 126 lb (57.2 kg)  02/26/20 121 lb 3.2 oz (55 kg)  05/07/19 122 lb (55.3 kg)    Physical Exam Vitals and nursing note reviewed.  Constitutional:      General: She is not in acute distress.    Appearance: Normal appearance. She is normal weight. She is not ill-appearing, toxic-appearing or diaphoretic.  HENT:     Head: Normocephalic and atraumatic.     Right Ear: Tympanic membrane, ear canal and external ear normal.     Left Ear: Tympanic membrane, ear canal and external ear normal.     Nose: Nose normal.     Mouth/Throat:     Mouth: Mucous membranes are moist.     Pharynx: Oropharynx is clear.  Eyes:     Extraocular Movements: Extraocular movements intact.     Conjunctiva/sclera: Conjunctivae normal.     Pupils: Pupils are equal, round, and reactive to light.  Cardiovascular:     Rate and Rhythm: Normal rate and regular rhythm.     Heart sounds: Normal heart  sounds.  Pulmonary:     Effort: Pulmonary effort is normal.     Breath sounds: Normal breath sounds.  Abdominal:     General: Bowel sounds are normal.     Palpations: Abdomen is soft.  Genitourinary:    Comments: Deferred, wants to see GYN Musculoskeletal:        General: Normal range of motion.     Cervical back: Normal range of motion and neck supple.  Skin:    General: Skin is warm and dry.     Capillary Refill: Capillary refill takes less than 2 seconds.  Neurological:     General: No focal deficit present.     Mental Status: She is alert and oriented to person, place, and time.     Cranial Nerves: No cranial nerve deficit.     Sensory: No sensory deficit.     Motor: No weakness.     Coordination: Coordination normal.     Gait: Gait normal.     Deep Tendon Reflexes: Reflexes normal.  Psychiatric:        Mood and Affect: Mood normal.        Behavior: Behavior normal.        Thought Content: Thought content normal.        Judgment: Judgment normal.    Results for orders placed or performed in visit on 05/07/19  ANA  Result Value Ref Range   Anti Nuclear Antibody (ANA) NEGATIVE NEGATIVE  C3 and C4  Result Value Ref Range   C3 Complement 95 83 - 193 mg/dL   C4 Complement 20 15 - 57 mg/dL  Cardiolipin antibodies, IgG, IgM, IgA  Result Value Ref Range   Anticardiolipin IgA <11 APL   Anticardiolipin IgG <14 GPL   Anticardiolipin IgM <12 MPL  Lupus Anticoagulant Eval w/Reflex  Result Value Ref Range   Lupus Anticoagulant see note    PTT-LA Screen 36 <=40 sec   dRVVT 39 <=45 sec   PT, Mixing Interp Not Indicated   Cryoglobulin  Result Value Ref Range   Cryoglobulin, Qualitative Analysis None Detected None Detec  Angiotensin converting enzyme  Result Value Ref Range   Angiotensin-Converting Enzyme 22 9 - 67 U/L  Beta-2 glycoprotein antibodies  Result Value Ref Range   Beta-2 Glyco I IgG <9 < OR = 20 [facility]   Beta-2 Glyco 1 IgM 9 < OR = 20 [facility]  Beta-2 Glyco 1 IgA <9  < OR = 20 [facility]  Anti-scleroderma antibody  Result Value Ref Range   Scleroderma (Scl-70) (ENA) Antibody, IgG <1.0 NEG <1.0 NEG AI  RNP Antibody  Result Value Ref Range   Ribonucleic Protein(ENA) Antibody, IgG <1.0 NEG <1.0 NEG AI  Anti-Smith antibody  Result Value Ref Range   ENA SM Ab Ser-aCnc <1.0 NEG <1.0 NEG AI  Sjogrens syndrome-A extractable nuclear antibody  Result Value Ref Range   SSA (Ro) (ENA) Antibody, IgG <1.0 NEG <1.0 NEG AI  Sjogrens syndrome-B extractable nuclear antibody  Result Value Ref Range   SSB (La) (ENA) Antibody, IgG <1.0 NEG <1.0 NEG AI  Anti-DNA antibody, double-stranded  Result Value Ref Range   ds DNA Ab 1 IU/mL  B. burgdorfi antibodies  Result Value Ref Range   B burgdorferi Ab IgG+IgM <0.90 index       Pertinent labs & imaging results that were available during my care of the patient were reviewed by me and considered in my medical decision making.  Assessment & Plan:  Julie Horn was seen today for physical exam.  Diagnoses and all orders for this visit:  Annual physical exam Health maintenance discussed in detail. Labs pending. Mammogram ordered. Referral to GYN for PAP.  -     CMP14+EGFR -     CBC with Differential/Platelet -     Lipid panel -     Thyroid Panel With TSH -     CA 125 -     MM 3D SCREEN BREAST BILATERAL; Future -     Hepatitis C antibody  Family history of breast cancer Mammogram ordered. Will check CA 125 today.  -     CA 125 -     MM 3D SCREEN BREAST BILATERAL; Future  Encounter for screening mammogram for malignant neoplasm of breast Mammogram ordered.  -     MM 3D SCREEN BREAST BILATERAL; Future  Vitamin D deficiency Labs pending. Continue repletion therapy. If indicated, will change repletion dosage. Eat foods rich in Vit D including milk, orange juice, yogurt with vitamin D added, salmon or mackerel, canned tuna fish, cereals with vitamin D added, and cod liver oil. Get out in the sun but make sure to wear at least SPF  30 sunscreen.  -     VITAMIN D 25 Hydroxy (Vit-D Deficiency, Fractures)  Mild intermittent asthma without complication Well controlled on below, will continue.  -     montelukast (SINGULAIR) 10 MG tablet; Take 1 tablet (10 mg total) by mouth at bedtime. -     albuterol (VENTOLIN HFA) 108 (90 Base) MCG/ACT inhaler; Inhale 2 puffs into the lungs every 4 (four) hours as needed for wheezing or shortness of breath.  Situational anxiety Due to mother's recent diagnosis of breat cancer. Declines SSRI or SNRI therapy. Will provide PRN atarax. Report any new, worsening, or persistent symptoms.  -     hydrOXYzine (ATARAX) 10 MG tablet; Take 1 tablet (10 mg total) by mouth 3 (three) times daily as needed.  Low grade squamous intraepithelial lesion on cytologic smear of cervix (LGSIL) Referral to GYN for PAP as pt would not like to complete this with PCP.  -     Ambulatory referral to Obstetrics / Gynecology    Continue all other maintenance medications.  Follow up plan: Return in about 3 months (around 10/04/2021), or if symptoms worsen or fail to improve.   Continue healthy lifestyle choices, including diet (rich in fruits, vegetables, and lean  proteins, and low in salt and simple carbohydrates) and exercise (at least 30 minutes of moderate physical activity daily).  Educational handout given for health maintenance.   The above assessment and management plan was discussed with the patient. The patient verbalized understanding of and has agreed to the management plan. Patient is aware to call the clinic if they develop any new symptoms or if symptoms persist or worsen. Patient is aware when to return to the clinic for a follow-up visit. Patient educated on when it is appropriate to go to the emergency department.   Monia Pouch, FNP-C [facility] [phone]

## 2021-07-07 ENCOUNTER — Encounter: Payer: Self-pay | Admitting: Family Medicine

## 2021-07-07 LAB — CMP14+EGFR
ALT: 14 IU/L (ref 0–32)
AST: 11 IU/L (ref 0–40)
Albumin/Globulin Ratio: 2.2 (ref 1.2–2.2)
Albumin: 5.4 g/dL — ABNORMAL HIGH (ref 3.8–4.8)
Alkaline Phosphatase: 75 IU/L (ref 44–121)
BUN/Creatinine Ratio: 11 (ref 9–23)
BUN: 7 mg/dL (ref 6–24)
Bilirubin Total: 0.2 mg/dL (ref 0.0–1.2)
CO2: 27 mmol/L (ref 20–29)
Calcium: 10.1 mg/dL (ref 8.7–10.2)
Chloride: 103 mmol/L (ref 96–106)
Creatinine, Ser: 0.66 mg/dL (ref 0.57–1.00)
Globulin, Total: 2.5 g/dL (ref 1.5–4.5)
Glucose: 85 mg/dL (ref 70–99)
Potassium: 4.1 mmol/L (ref 3.5–5.2)
Sodium: 145 mmol/L — ABNORMAL HIGH (ref 134–144)
Total Protein: 7.9 g/dL (ref 6.0–8.5)
eGFR: 111 mL/min/{1.73_m2} (ref >59)

## 2021-07-07 LAB — VITAMIN D 25 HYDROXY (VIT D DEFICIENCY, FRACTURES): Vit D, 25-Hydroxy: 278.4 ng/mL — ABNORMAL HIGH (ref 30.0–100.0)

## 2021-07-07 LAB — LIPID PANEL
Chol/HDL Ratio: 2.6 ratio (ref 0.0–4.4)
Cholesterol, Total: 222 mg/dL — ABNORMAL HIGH (ref 100–199)
HDL: 87 mg/dL (ref >39)
LDL Chol Calc (NIH): 114 mg/dL — ABNORMAL HIGH (ref 0–99)
Triglycerides: 123 mg/dL (ref 0–149)
VLDL Cholesterol Cal: 21 mg/dL (ref 5–40)

## 2021-07-07 LAB — HEPATITIS C ANTIBODY: Hep C Virus Ab: <0.1 s/co ratio (ref 0.0–0.9)

## 2021-07-07 LAB — CBC WITH DIFFERENTIAL/PLATELET
Basophils Absolute: 0.1 10*3/uL (ref 0.0–0.2)
Basos: 1 %
EOS (ABSOLUTE): 0.3 10*3/uL (ref 0.0–0.4)
Eos: 5 %
Hematocrit: 50.9 % — ABNORMAL HIGH (ref 34.0–46.6)
Hemoglobin: 17.3 g/dL — ABNORMAL HIGH (ref 11.1–15.9)
Immature Grans (Abs): 0 10*3/uL (ref 0.0–0.1)
Immature Granulocytes: 0 %
Lymphocytes Absolute: 1.9 10*3/uL (ref 0.7–3.1)
Lymphs: 32 %
MCH: 32.1 pg (ref 26.6–33.0)
MCHC: 34 g/dL (ref 31.5–35.7)
MCV: 94 fL (ref 79–97)
Monocytes Absolute: 0.4 10*3/uL (ref 0.1–0.9)
Monocytes: 6 %
Neutrophils Absolute: 3.4 10*3/uL (ref 1.4–7.0)
Neutrophils: 56 %
Platelets: 268 10*3/uL (ref 150–450)
RBC: 5.39 x10E6/uL — ABNORMAL HIGH (ref 3.77–5.28)
RDW: 12.7 % (ref 11.7–15.4)
WBC: 6.1 10*3/uL (ref 3.4–10.8)

## 2021-07-07 LAB — CA 125: Cancer Antigen (CA) 125: 9.8 U/mL (ref 0.0–38.1)

## 2021-07-07 LAB — THYROID PANEL WITH TSH
Free Thyroxine Index: 2.5 (ref 1.2–4.9)
T3 Uptake Ratio: 26 % (ref 24–39)
T4, Total: 9.5 ug/dL (ref 4.5–12.0)
TSH: 2.28 u[IU]/mL (ref 0.450–4.500)

## 2021-07-26 ENCOUNTER — Encounter: Payer: Self-pay | Admitting: Family Medicine

## 2021-07-27 ENCOUNTER — Other Ambulatory Visit: Payer: Self-pay | Admitting: Family Medicine

## 2021-07-27 DIAGNOSIS — F418 Other specified anxiety disorders: Secondary | ICD-10-CM | POA: Insufficient documentation

## 2021-07-27 MED ORDER — HYDROXYZINE PAMOATE 25 MG PO CAPS: 25.0000 mg | ORAL_CAPSULE | Freq: Three times a day (TID) | ORAL | 5 refills | Status: DC | PRN

## 2021-08-05 ENCOUNTER — Encounter: Payer: Self-pay | Admitting: Family Medicine

## 2021-08-05 ENCOUNTER — Encounter (HOSPITAL_COMMUNITY): Payer: Self-pay

## 2021-08-05 ENCOUNTER — Ambulatory Visit (HOSPITAL_COMMUNITY)
Admission: RE | Admit: 2021-08-05 | Discharge: 2021-08-05 | Disposition: A | Discharge status: Home / Self Care | Payer: BC Managed Care – PPO | Source: Ambulatory Visit | Attending: Family Medicine | Admitting: Family Medicine

## 2021-08-05 ENCOUNTER — Other Ambulatory Visit: Payer: Self-pay

## 2021-08-05 DIAGNOSIS — Z1231 Encounter for screening mammogram for malignant neoplasm of breast: Secondary | ICD-10-CM | POA: Insufficient documentation

## 2021-08-05 DIAGNOSIS — Z0000 Encounter for general adult medical examination without abnormal findings: Secondary | ICD-10-CM | POA: Insufficient documentation

## 2021-08-05 DIAGNOSIS — Z803 Family history of malignant neoplasm of breast: Secondary | ICD-10-CM | POA: Insufficient documentation

## 2021-08-05 NOTE — Addendum Note (Signed)
Addended by: Baruch Gouty on: 08/05/2021 02:07 PM   Modules accepted: Orders

## 2021-08-18 ENCOUNTER — Encounter: Payer: Self-pay | Admitting: Obstetrics & Gynecology

## 2021-08-31 ENCOUNTER — Encounter (HOSPITAL_COMMUNITY): Payer: Self-pay

## 2021-08-31 ENCOUNTER — Ambulatory Visit (HOSPITAL_COMMUNITY): Payer: BC Managed Care – PPO

## 2021-08-31 ENCOUNTER — Inpatient Hospital Stay (HOSPITAL_COMMUNITY): Admission: RE | Admit: 2021-08-31 | Payer: BC Managed Care – PPO | Source: Ambulatory Visit

## 2021-09-07 ENCOUNTER — Encounter: Payer: Self-pay | Admitting: Family Medicine

## 2021-10-04 ENCOUNTER — Encounter: Payer: Self-pay | Admitting: Obstetrics & Gynecology

## 2021-10-05 ENCOUNTER — Ambulatory Visit: Payer: BC Managed Care – PPO | Admitting: Family Medicine

## 2021-10-06 ENCOUNTER — Encounter: Payer: Self-pay | Admitting: Obstetrics & Gynecology

## 2022-01-04 ENCOUNTER — Encounter: Payer: Self-pay | Admitting: Family Medicine

## 2022-01-04 ENCOUNTER — Ambulatory Visit (INDEPENDENT_AMBULATORY_CARE_PROVIDER_SITE_OTHER): Payer: BC Managed Care – PPO | Admitting: Family Medicine

## 2022-01-04 VITALS — BP 121/78 | HR 74 | Temp 98.8°F | Ht 64.0 in | Wt 128.0 lb

## 2022-01-04 DIAGNOSIS — S40262A Insect bite (nonvenomous) of left shoulder, initial encounter: Secondary | ICD-10-CM

## 2022-01-04 DIAGNOSIS — A6920 Lyme disease, unspecified: Secondary | ICD-10-CM

## 2022-01-04 DIAGNOSIS — Z8742 Personal history of other diseases of the female genital tract: Secondary | ICD-10-CM

## 2022-01-04 DIAGNOSIS — W57XXXA Bitten or stung by nonvenomous insect and other nonvenomous arthropods, initial encounter: Secondary | ICD-10-CM

## 2022-01-04 DIAGNOSIS — R21 Rash and other nonspecific skin eruption: Secondary | ICD-10-CM | POA: Diagnosis not present

## 2022-01-04 MED ORDER — FLUCONAZOLE 150 MG PO TABS: ORAL_TABLET | ORAL | 0 refills | Status: DC

## 2022-01-04 MED ORDER — AMOXICILLIN 500 MG PO CAPS: 500.0000 mg | ORAL_CAPSULE | Freq: Three times a day (TID) | ORAL | 0 refills | Status: AC

## 2022-01-04 NOTE — Progress Notes (Signed)
Subjective:  Patient ID: Julie Horn, female    DOB: 1975-08-09, 46 y.o.   MRN: [id number]  Patient Care Team: Baruch Gouty, FNP as PCP - General (Family Medicine) Garald Balding, MD as Consulting Physician (Orthopedic Surgery) Bo Merino, MD as Consulting Physician (Rheumatology)   Chief Complaint:  Insect Bite   HPI: Julie Horn is a 46 y.o. female presenting on 01/04/2022 for Insect Bite   Pt presents today with complaints of a red rash after removing a tick from her left shoulder yesterday. States she is unsure how long the tick was embedded. States the redness has increased since removing the tick. She denies fever, chills, arthralgias, headaches, of GI symptoms. She has not tried anything for symptoms. No prior history of tick borne illnesses. Is allergic to doxycycline - GI upset, tooth discoloration, and rash.     Relevant past medical, surgical, family, and social history reviewed and updated as indicated.  Allergies and medications reviewed and updated. Data reviewed: Chart in Epic.   Past Medical History:  Diagnosis Date   Abnormal Pap smear of cervix 04/17/2018   Asthma    GERD (gastroesophageal reflux disease)    History of migraine 04/17/2018   Had been managed on Topamax but had side effect. Now not active.   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 infertility  Other Topics Concern   Not on file  Social History Narrative   Not on file   Social Determinants of Health   Financial Resource Strain: Not on file  Food Insecurity: Not on file  Transportation Needs: Not on file  Physical Activity: Not on file  Stress: Not on file  Social Connections: Not on file  Intimate Partner Violence: Not on file    Outpatient Encounter Medications as of 01/04/2022  Medication Sig   albuterol (VENTOLIN HFA) 108 (90 Base) MCG/ACT inhaler Inhale 2 puffs into the lungs every 4 (four) hours as needed for wheezing or shortness of breath.   amoxicillin (AMOXIL) 500 MG capsule Take 1 capsule (500 mg total) by mouth 3 (three) times daily for 10 days.   B COMPLEX VITAMINS PO Take by mouth.   fluconazole (DIFLUCAN) 150 MG tablet 1 po q week x 4 weeks   fluticasone (FLONASE) 50 MCG/ACT nasal spray Place 2 sprays into both nostrils daily.   hydrOXYzine (VISTARIL) 25 MG capsule Take 1 capsule (25 mg  total) by mouth every 8 (eight) hours as needed for anxiety.   montelukast (SINGULAIR) 10 MG tablet Take 1 tablet (10 mg total) by mouth at bedtime.   Multiple Vitamin (MULTIVITAMIN) tablet Take 1 tablet by mouth daily.   OVER THE COUNTER MEDICATION 500 mg.   [DISCONTINUED] Vitamins A & D (VITAMIN A & D PO) Take by mouth.   No facility-administered encounter medications on file as of 01/04/2022.    Allergies  Allergen Reactions   Latex    Levofloxacin    Sulfa Antibiotics    Azithromycin Rash   Doxycycline Rash    Review of Systems  Constitutional:  Negative for activity change, appetite change, chills, diaphoresis, fatigue, fever and unexpected weight change.   HENT: Negative.    Eyes: Negative.  Negative for photophobia and visual disturbance.  Respiratory:  Negative for cough, chest tightness and shortness of breath.   Cardiovascular:  Negative for chest pain, palpitations and leg swelling.  Gastrointestinal:  Negative for abdominal pain, blood in stool, constipation, diarrhea, nausea and vomiting.  Endocrine: Negative.   Genitourinary:  Negative for decreased urine volume, difficulty urinating, dysuria, frequency and urgency.  Musculoskeletal:  Negative for arthralgias and myalgias.  Skin:  Positive for rash and wound.  Allergic/Immunologic: Negative.   Neurological:  Negative for dizziness and headaches.  Hematological: Negative.   Psychiatric/Behavioral:  Negative for confusion, hallucinations, sleep disturbance and suicidal ideas.   All other systems reviewed and are negative.       Objective:  BP 121/78   Pulse 74   Temp 98.8 F (37.1 C)   Ht 5' 4"  (1.626 m)   Wt 128 lb (58.1 kg)   SpO2 99%   BMI 21.97 kg/m    Wt Readings from Last 3 Encounters:  01/04/22 128 lb (58.1 kg)  07/06/21 126 lb (57.2 kg)  02/26/20 121 lb 3.2 oz (55 kg)    Physical Exam Vitals and nursing note reviewed.  Constitutional:      General: She is not in acute distress.    Appearance: Normal appearance. She is well-developed, well-groomed and normal weight. She is not ill-appearing, toxic-appearing or diaphoretic.  HENT:     Head: Normocephalic and atraumatic.     Jaw: There is normal jaw occlusion.     Right Ear: Hearing normal.     Left Ear: Hearing normal.     Nose: Nose normal.     Mouth/Throat:     Lips: Pink.     Mouth: Mucous membranes are moist.     Pharynx: Uvula midline.  Eyes:     General: Lids are normal.     Pupils: Pupils are equal, round, and reactive to light.  Neck:     Thyroid: No thyroid mass, thyromegaly or thyroid tenderness.     Vascular: No carotid bruit or JVD.     Trachea: Trachea and phonation normal.   Cardiovascular:     Rate and Rhythm: Normal rate and regular rhythm.     Chest Wall: PMI is not displaced.     Pulses: Normal pulses.     Heart sounds: Normal heart sounds. No murmur heard.    No friction rub. No gallop.  Pulmonary:     Effort: Pulmonary effort is normal.     Breath sounds: Normal breath sounds.  Abdominal:     General: There is no abdominal bruit.     Palpations: There is no hepatomegaly or splenomegaly.  Musculoskeletal:        General: Normal  range of motion.     Cervical back: Normal range of motion and neck supple.     Right lower leg: No edema.     Left lower leg: No edema.  Lymphadenopathy:     Cervical: No cervical adenopathy.  Skin:    General: Skin is warm and dry.     Capillary Refill: Capillary refill takes less than 2 seconds.     Coloration: Skin is not cyanotic, jaundiced or pale.     Findings: Erythema and rash present.       Neurological:     General: No focal deficit present.     Mental Status: She is alert and oriented to person, place, and time.     Sensory: Sensation is intact.     Motor: Motor function is intact.     Coordination: Coordination is intact.     Gait: Gait is intact.     Deep Tendon Reflexes: Reflexes are normal and symmetric.  Psychiatric:        Attention and Perception: Attention and perception normal.        Mood and Affect: Mood and affect normal.        Speech: Speech normal.        Behavior: Behavior normal. Behavior is cooperative.        Thought Content: Thought content normal.        Cognition and Memory: Cognition and memory normal.        Judgment: Judgment normal.     Results for orders placed or performed in visit on 07/06/21  CMP14+EGFR  Result Value Ref Range   Glucose 85 70 - 99 mg/dL   BUN 7 6 - 24 mg/dL   Creatinine, Ser 0.66 0.57 - 1.00 mg/dL   eGFR 111 >59 mL/min/1.73   BUN/Creatinine Ratio 11 9 - 23   Sodium 145 (H) 134 - 144 mmol/L   Potassium 4.1 3.5 - 5.2 mmol/L   Chloride 103 96 -  106 mmol/L   CO2 27 20 - 29 mmol/L   Calcium 10.1 8.7 - 10.2 mg/dL   Total Protein 7.9 6.0 - 8.5 g/dL   Albumin 5.4 (H) 3.8 - 4.8 g/dL   Globulin, Total 2.5 1.5 - 4.5 g/dL   Albumin/Globulin Ratio 2.2 1.2 - 2.2   Bilirubin Total 0.2 0.0 - 1.2 mg/dL   Alkaline Phosphatase 75 44 - 121 IU/L   AST 11 0 - 40 IU/L   ALT 14 0 - 32 IU/L  CBC with Differential/Platelet  Result Value Ref Range   WBC 6.1 3.4 - 10.8 x10E3/uL   RBC 5.39 (H) 3.77 - 5.28 x10E6/uL   Hemoglobin 17.3 (H) 11.1 - 15.9 g/dL   Hematocrit 50.9 (H) 34.0 - 46.6 %   MCV 94 79 - 97 fL   MCH 32.1 26.6 - 33.0 pg   MCHC 34.0 31.5 - 35.7 g/dL   RDW 12.7 11.7 - 15.4 %   Platelets 268 150 - 450 x10E3/uL   Neutrophils 56 Not Estab. %   Lymphs 32 Not Estab. %   Monocytes 6 Not Estab. %   Eos 5 Not Estab. %   Basos 1 Not Estab. %   Neutrophils Absolute 3.4 1.4 - 7.0 x10E3/uL   Lymphocytes Absolute 1.9 0.7 - 3.1 x10E3/uL   Monocytes Absolute 0.4 0.1 - 0.9 x10E3/uL   EOS (ABSOLUTE) 0.3 0.0 - 0.4 x10E3/uL   Basophils Absolute 0.1 0.0 - 0.2 x10E3/uL   Immature Granulocytes 0 Not Estab. %  Immature Grans (Abs) 0.0 0.0 - 0.1 x10E3/uL  Lipid panel  Result Value Ref Range   Cholesterol, Total 222 (H) 100 - 199 mg/dL   Triglycerides 123 0 - 149 mg/dL   HDL 87 >39 mg/dL   VLDL Cholesterol Cal 21 5 - 40 mg/dL   LDL Chol Calc (NIH) 114 (H) 0 - 99 mg/dL   Chol/HDL Ratio 2.6 0.0 - 4.4 ratio  Thyroid Panel With TSH  Result Value Ref Range   TSH 2.280 0.450 - 4.500 uIU/mL   T4, Total 9.5 4.5 - 12.0 ug/dL   T3 Uptake Ratio 26 24 - 39 %   Free Thyroxine Index 2.5 1.2 - 4.9  CA 125  Result Value Ref Range   Cancer Antigen (CA) 125 9.8 0.0 - 38.1 U/mL  VITAMIN D 25 Hydroxy (Vit-D Deficiency, Fractures)  Result Value Ref Range   Vit D, 25-Hydroxy 278.4 (H) 30.0 - 100.0 ng/mL  Hepatitis C antibody  Result Value Ref Range   Hep C Virus Ab <0.1 0.0 - 0.9 s/co ratio       Pertinent labs & imaging results that were available during  my care of the patient were reviewed by me and considered in my medical decision making.  Assessment & Plan:  Julie Horn was seen today for insect bite.  Diagnoses and all orders for this visit:  Tick bite of left shoulder, initial encounter Erythema migrans (Lyme disease) Classic Lyme Rash to left shoulder post tick removal. Allergic to Doxycycline and does not wish to take. Will place on prophylactic amoxil as a second line treatment. Labs pending. Will stop therapy if warranted.  -     Rocky mtn spotted fvr abs pnl(IgG+IgM) -     Lyme Disease Serology w/Reflex -     amoxicillin (AMOXIL) 500 MG capsule; Take 1 capsule (500 mg total) by mouth 3 (three) times daily for 10 days.  History of vaginitis Educated on when and how to use.  -     fluconazole (DIFLUCAN) 150 MG tablet; 1 po q week x 4 weeks     Continue all other maintenance medications.  Follow up plan: Return if symptoms worsen or fail to improve.   Continue healthy lifestyle choices, including diet (rich in fruits, vegetables, and lean proteins, and low in salt and simple carbohydrates) and exercise (at least 30 minutes of moderate physical activity daily).  Educational handout given for tick bite   The above assessment and management plan was discussed with the patient. The patient verbalized understanding of and has agreed to the management plan. Patient is aware to call the clinic if they develop any new symptoms or if symptoms persist or worsen. Patient is aware when to return to the clinic for a follow-up visit. Patient educated on when it is appropriate to go to the emergency department.   Monia Pouch, FNP-C [facility] [phone]

## 2022-01-05 ENCOUNTER — Encounter: Payer: Self-pay | Admitting: Family Medicine

## 2022-01-06 LAB — LYME DISEASE SEROLOGY W/REFLEX: Lyme Total Antibody EIA: NEGATIVE

## 2022-01-06 LAB — ROCKY MTN SPOTTED FVR ABS PNL(IGG+IGM)
RMSF IgG: NEGATIVE
RMSF IgM: 0.81 index (ref 0.00–0.89)

## 2022-01-11 ENCOUNTER — Telehealth: Payer: Self-pay | Admitting: Family Medicine

## 2022-03-02 DIAGNOSIS — M25521 Pain in right elbow: Secondary | ICD-10-CM | POA: Diagnosis not present

## 2022-03-02 DIAGNOSIS — W19XXXA Unspecified fall, initial encounter: Secondary | ICD-10-CM | POA: Diagnosis not present

## 2022-03-02 DIAGNOSIS — R252 Cramp and spasm: Secondary | ICD-10-CM | POA: Diagnosis not present

## 2022-03-02 DIAGNOSIS — R55 Syncope and collapse: Secondary | ICD-10-CM | POA: Diagnosis not present

## 2022-08-01 ENCOUNTER — Telehealth: Payer: BC Managed Care – PPO | Admitting: Physician Assistant

## 2022-08-01 DIAGNOSIS — T3695XA Adverse effect of unspecified systemic antibiotic, initial encounter: Secondary | ICD-10-CM | POA: Diagnosis not present

## 2022-08-01 DIAGNOSIS — B9689 Other specified bacterial agents as the cause of diseases classified elsewhere: Secondary | ICD-10-CM | POA: Diagnosis not present

## 2022-08-01 DIAGNOSIS — B379 Candidiasis, unspecified: Secondary | ICD-10-CM | POA: Diagnosis not present

## 2022-08-01 DIAGNOSIS — J208 Acute bronchitis due to other specified organisms: Secondary | ICD-10-CM | POA: Diagnosis not present

## 2022-08-01 MED ORDER — FLUCONAZOLE 150 MG PO TABS: 150.0000 mg | ORAL_TABLET | ORAL | 0 refills | Status: DC | PRN

## 2022-08-01 MED ORDER — AMOXICILLIN-POT CLAVULANATE 875-125 MG PO TABS: 1.0000 | ORAL_TABLET | Freq: Two times a day (BID) | ORAL | 0 refills | Status: DC

## 2022-08-01 MED ORDER — BENZONATATE 100 MG PO CAPS: 100.0000 mg | ORAL_CAPSULE | Freq: Three times a day (TID) | ORAL | 0 refills | Status: DC | PRN

## 2022-08-01 MED ORDER — PROMETHAZINE-DM 6.25-15 MG/5ML PO SYRP: 5.0000 mL | ORAL_SOLUTION | Freq: Four times a day (QID) | ORAL | 0 refills | Status: DC | PRN

## 2022-08-01 MED ORDER — PREDNISONE 10 MG (21) PO TBPK: ORAL_TABLET | ORAL | 0 refills | Status: DC

## 2022-08-01 NOTE — Progress Notes (Signed)
Virtual Visit Consent   Julie Horn, you are scheduled for a virtual visit with a [name] provider today. Just as with appointments in the office, your consent must be obtained to participate. Your consent will be active for this visit and any virtual visit you may have with one of our providers in the next 365 days. If you have a MyChart account, a copy of this consent can be sent to you electronically.  As this is a virtual visit, video technology does not allow for your provider to perform a traditional examination. This may limit your provider's ability to fully assess your condition. If your provider identifies any concerns that need to be evaluated in person or the need to arrange testing (such as labs, EKG, etc.), we will make arrangements to do so. Although advances in technology are sophisticated, we cannot ensure that it will always work on either your end or our end. If the connection with a video visit is poor, the visit may have to be switched to a telephone visit. With either a video or telephone visit, we are not always able to ensure that we have a secure connection.  By engaging in this virtual visit, you consent to the provision of healthcare and authorize for your insurance to be billed (if applicable) for the services provided during this visit. Depending on your insurance coverage, you may receive a charge related to this service.  I need to obtain your verbal consent now. Are you willing to proceed with your visit today? [name] has provided verbal consent on 08/01/2022 for a virtual visit (video or telephone). Mar Daring, PA-C  Date: 08/01/2022 9:20 AM  Virtual Visit via Video Note   I, Mar Daring, connected with  Julie Horn  ([id number], Jan 16, 1976) on 08/01/22 at  9:15 AM EST by a video-enabled telemedicine application and verified that I am speaking with the correct person using two identifiers.  Location: Patient: Virtual  Visit Location Patient: Home Provider: Virtual Visit Location Provider: Home Office   I discussed the limitations of evaluation and management by telemedicine and the availability of in person appointments. The patient expressed understanding and agreed to proceed.    History of Present Illness: [name] is a 47 y.o. who identifies as a female who was assigned female at birth, and is being seen today for cough.  HPI: Cough This is a new problem. The current episode started more than 1 month ago (feels like she has been sick off and on since November). The problem has been waxing and waning. The problem occurs every few minutes. The cough is Non-productive (non productive during the day and productive and thick at night and morning). Associated symptoms comments: Fatigue, now affecting sleep, ribs and stomach hurt. The symptoms are aggravated by lying down. Treatments tried: bronchaid, mucinex, hot teas, tussin dm, nightime tylenol severe. The treatment provided no relief.      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   LGSIL on Pap smear of cervix  04/17/2018   Fibroid uterus 04/17/2018    Allergies:  Allergies  Allergen Reactions   Latex    Levofloxacin    Sulfa Antibiotics    Azithromycin Rash   Doxycycline Rash   Medications:  Current Outpatient Medications:    amoxicillin-clavulanate (AUGMENTIN) 875-125 MG tablet, Take 1 tablet by mouth 2 (two) times daily., Disp:  20 tablet, Rfl: 0   benzonatate (TESSALON) 100 MG capsule, Take 1 capsule (100 mg total) by mouth 3 (three) times daily as needed., Disp: 30 capsule, Rfl: 0   fluconazole (DIFLUCAN) 150 MG tablet, Take 1 tablet (150 mg total) by mouth every 3 (three) days as needed., Disp: 3 tablet, Rfl: 0   predniSONE (STERAPRED UNI-PAK 21 TAB) 10 MG (21) TBPK tablet, 6 day taper; take as instructed on package instructions, Disp: 21 tablet, Rfl: 0   promethazine-dextromethorphan (PROMETHAZINE-DM) 6.25-15 MG/5ML syrup, Take 5 mLs by mouth 4 (four) times daily as needed., Disp: 118 mL, Rfl: 0   albuterol (VENTOLIN HFA) 108 (90 Base) MCG/ACT inhaler, Inhale 2 puffs into the lungs every 4 (four) hours as needed for wheezing or shortness of breath., Disp: 6.7 g, Rfl: 2   B COMPLEX VITAMINS PO, Take by mouth., Disp: , Rfl:    fluticasone (FLONASE) 50 MCG/ACT nasal spray, Place 2 sprays into both nostrils daily., Disp: 16 g, Rfl: 0   hydrOXYzine (VISTARIL) 25 MG capsule, Take 1 capsule (25 mg total) by mouth every 8 (eight) hours as needed for anxiety., Disp: 30 capsule, Rfl: 5   montelukast (SINGULAIR) 10 MG tablet, Take 1 tablet (10 mg total) by mouth at bedtime., Disp: 90 tablet, Rfl: 3   Multiple Vitamin (MULTIVITAMIN) tablet, Take 1 tablet by mouth daily., Disp: , Rfl:    OVER THE COUNTER MEDICATION, 500 mg., Disp: , Rfl:   Observations/Objective: Patient is well-developed, well-nourished in no acute distress.  Resting comfortably at home.  Head is normocephalic, atraumatic.  No labored breathing.  Speech is clear and coherent with logical content.  Patient is alert and oriented at baseline.    Assessment and Plan: 1. Acute bacterial bronchitis - amoxicillin-clavulanate (AUGMENTIN) 875-125 MG tablet; Take 1 tablet by mouth 2 (two) times daily.  Dispense: 20 tablet; Refill: 0 - benzonatate (TESSALON) 100 MG capsule; Take 1 capsule (100 mg total) by mouth 3 (three) times daily as needed.  Dispense: 30 capsule;  Refill: 0 - promethazine-dextromethorphan (PROMETHAZINE-DM) 6.25-15 MG/5ML syrup; Take 5 mLs by mouth 4 (four) times daily as needed.  Dispense: 118 mL; Refill: 0 - predniSONE (STERAPRED UNI-PAK 21 TAB) 10 MG (21) TBPK tablet; 6 day taper; take as instructed on package instructions  Dispense: 21 tablet; Refill: 0  2. Antibiotic-induced yeast infection - fluconazole (DIFLUCAN) 150 MG tablet; Take 1 tablet (150 mg total) by mouth every 3 (three) days as needed.  Dispense: 3 tablet; Refill: 0  - Worsening over a week despite OTC medications - Will treat with Augmentin, Prednisone, Promethazine DM and tessalon perles - Can continue Mucinex  - Push fluids.  - Rest.  - Steam and humidifier can help - Diflucan given as prophylaxis as patient tends to get vaginal yeast infections with antibiotic use - Seek in person evaluation if worsening or symptoms fail to improve    Follow Up Instructions: I discussed the assessment and treatment plan with the patient. The patient was provided an opportunity to ask questions and all were answered. The patient agreed with the plan and demonstrated an understanding of the instructions.  A  copy of instructions were sent to the patient via MyChart unless otherwise noted below.    The patient was advised to call back or seek an in-person evaluation if the symptoms worsen or if the condition fails to improve as anticipated.  Time:  I spent 11 minutes with the patient via telehealth technology discussing the above problems/concerns.    Mar Daring, PA-C

## 2022-08-01 NOTE — Patient Instructions (Signed)
Julie Horn, thank you for joining Mar Daring, PA-C for today's virtual visit.  While this provider is not your primary care provider (PCP), if your PCP is located in our provider database this encounter information will be shared with them immediately following your visit.   [facility] account gives you access to today's visit and all your visits, tests, and labs performed at [facility] " click here if you don't have a [place] account or go to [email or web address]  Consent: (Patient) Julie Horn provided verbal consent for this virtual visit at the beginning of the encounter.  Current Medications:  Current Outpatient Medications:    amoxicillin-clavulanate (AUGMENTIN) 875-125 MG tablet, Take 1 tablet by mouth 2 (two) times daily., Disp: 20 tablet, Rfl: 0   benzonatate (TESSALON) 100 MG capsule, Take 1 capsule (100 mg total) by mouth 3 (three) times daily as needed., Disp: 30 capsule, Rfl: 0   fluconazole (DIFLUCAN) 150 MG tablet, Take 1 tablet (150 mg total) by mouth every 3 (three) days as needed., Disp: 3 tablet, Rfl: 0   predniSONE (STERAPRED UNI-PAK 21 TAB) 10 MG (21) TBPK tablet, 6 day taper; take as instructed on package instructions, Disp: 21 tablet, Rfl: 0   promethazine-dextromethorphan (PROMETHAZINE-DM) 6.25-15 MG/5ML syrup, Take 5 mLs by mouth 4 (four) times daily as needed., Disp: 118 mL, Rfl: 0   albuterol (VENTOLIN HFA) 108 (90 Base) MCG/ACT inhaler, Inhale 2 puffs into the lungs every 4 (four) hours as needed for wheezing or shortness of breath., Disp: 6.7 g, Rfl: 2   B COMPLEX VITAMINS PO, Take by mouth., Disp: , Rfl:    fluticasone (FLONASE) 50 MCG/ACT nasal spray, Place 2 sprays into both nostrils daily., Disp: 16 g, Rfl: 0   hydrOXYzine (VISTARIL) 25 MG capsule, Take 1 capsule (25 mg total) by mouth every 8 (eight) hours as needed for anxiety., Disp: 30 capsule, Rfl: 5   montelukast (SINGULAIR) 10 MG tablet,  Take 1 tablet (10 mg total) by mouth at bedtime., Disp: 90 tablet, Rfl: 3   Multiple Vitamin (MULTIVITAMIN) tablet, Take 1 tablet by mouth daily., Disp: , Rfl:    OVER THE COUNTER MEDICATION, 500 mg., Disp: , Rfl:    Medications ordered in this encounter:  Meds ordered this encounter  Medications   amoxicillin-clavulanate (AUGMENTIN) 875-125 MG tablet    Sig: Take 1 tablet by mouth 2 (two) times daily.    Dispense:  20 tablet    Refill:  0    Order Specific Question:   Supervising Provider    Answer:   Chase Picket [[id number]]   benzonatate (TESSALON) 100 MG capsule    Sig: Take 1 capsule (100 mg total) by mouth 3 (three) times daily as needed.    Dispense:  30 capsule    Refill:  0    Order Specific Question:   Supervising Provider    Answer:   Chase Picket [id number]   promethazine-dextromethorphan (PROMETHAZINE-DM) 6.25-15 MG/5ML syrup    Sig: Take 5 mLs by mouth 4 (four) times daily as needed.    Dispense:  118 mL    Refill:  0    Order Specific Question:   Supervising Provider    Answer:   Chase Picket [[id number]]   predniSONE (STERAPRED UNI-PAK 21 TAB) 10 MG (21) TBPK tablet    Sig: 6 day taper; take as instructed on package instructions    Dispense:  21 tablet    Refill:  0  Order Specific Question:   Supervising Provider    Answer:   Chase Picket [[id number]]   fluconazole (DIFLUCAN) 150 MG tablet    Sig: Take 1 tablet (150 mg total) by mouth every 3 (three) days as needed.    Dispense:  3 tablet    Refill:  0    Order Specific Question:   Supervising Provider    Answer:   Chase Picket [id number]     *If you need refills on other medications prior to your next appointment, please contact your pharmacy*  Follow-Up: Call back or seek an in-person evaluation if the symptoms worsen or if the condition fails to improve as anticipated.  [place] [facility] [phone]  Other Instructions  Acute Bronchitis, Adult  Acute bronchitis is sudden  inflammation of the main airways (bronchi) that come off the windpipe (trachea) in the lungs. The swelling causes the airways to get smaller and make more mucus than normal. This can make it hard to breathe and can cause coughing or noisy breathing (wheezing). Acute bronchitis may last several weeks. The cough may last longer. Allergies, asthma, and exposure to smoke may make the condition worse. What are the causes? This condition can be caused by germs and by substances that irritate the lungs, including: Cold and flu viruses. The most common cause of this condition is the virus that causes the common cold. Bacteria. This is less common. Breathing in substances that irritate the lungs, including: Smoke from cigarettes and other forms of tobacco. Dust and pollen. Fumes from household cleaning products, gases, or burned fuel. Indoor or outdoor air pollution. What increases the risk? The following factors may make you more likely to develop this condition: A weak body's defense system, also called the immune system. A condition that affects your lungs and breathing, such as asthma. What are the signs or symptoms? Common symptoms of this condition include: Coughing. This may bring up clear, yellow, or green mucus from your lungs (sputum). Wheezing. Runny or stuffy nose. Having too much mucus in your lungs (chest congestion). Shortness of breath. Aches and pains, including sore throat or chest. How is this diagnosed? This condition is usually diagnosed based on: Your symptoms and medical history. A physical exam. You may also have other tests, including tests to rule out other conditions, such as pneumonia. These tests include: A test of lung function. Test of a mucus sample to look for the presence of bacteria. Tests to check the oxygen level in your blood. Blood tests. Chest X-ray. How is this treated? Most cases of acute bronchitis clear up over time without treatment. Your health  care provider may recommend: Drinking more fluids to help thin your mucus so it is easier to cough up. Taking inhaled medicine (inhaler) to improve air flow in and out of your lungs. Using a vaporizer or a humidifier. These are machines that add water to the air to help you breathe better. Taking a medicine that thins mucus and clears congestion (expectorant). Taking a medicine that prevents or stops coughing (cough suppressant). It is not common to take an antibiotic medicine for this condition. Follow these instructions at home:  Take over-the-counter and prescription medicines only as told by your health care provider. Use an inhaler, vaporizer, or humidifier as told by your health care provider. Take two teaspoons (10 mL) of honey at bedtime to lessen coughing at night. Drink enough fluid to keep your urine pale yellow. Do not use any products that contain  nicotine or tobacco. These products include cigarettes, chewing tobacco, and vaping devices, such as e-cigarettes. If you need help quitting, ask your health care provider. Get plenty of rest. Return to your normal activities as told by your health care provider. Ask your health care provider what activities are safe for you. Keep all follow-up visits. This is important. How is this prevented? To lower your risk of getting this condition again: Wash your hands often with soap and water for at least 20 seconds. If soap and water are not available, use hand sanitizer. Avoid contact with people who have cold symptoms. Try not to touch your mouth, nose, or eyes with your hands. Avoid breathing in smoke or chemical fumes. Breathing smoke or chemical fumes will make your condition worse. Get the flu shot every year. Contact a health care provider if: Your symptoms do not improve after 2 weeks. You have trouble coughing up the mucus. Your cough keeps you awake at night. You have a fever. Get help right away if you: Cough up blood. Feel  pain in your chest. Have severe shortness of breath. Faint or keep feeling like you are going to faint. Have a severe headache. Have a fever or chills that get worse. These symptoms may represent a serious problem that is an emergency. Do not wait to see if the symptoms will go away. Get medical help right away. Call your local emergency services (911 in the [place] Do not drive yourself to the hospital. Summary Acute bronchitis is inflammation of the main airways (bronchi) that come off the windpipe (trachea) in the lungs. The swelling causes the airways to get smaller and make more mucus than normal. Drinking more fluids can help thin your mucus so it is easier to cough up. Take over-the-counter and prescription medicines only as told by your health care provider. Do not use any products that contain nicotine or tobacco. These products include cigarettes, chewing tobacco, and vaping devices, such as e-cigarettes. If you need help quitting, ask your health care provider. Contact a health care provider if your symptoms do not improve after 2 weeks. This information is not intended to replace advice given to you by your health care provider. Make sure you discuss any questions you have with your health care provider. Document Revised: 10/21/2021 Document Reviewed: 11/11/2020 Elsevier Patient Education  [place].    If you have been instructed to have an in-person evaluation today at a local Urgent Care facility, please use the link below. It will take you to a list of all of our available [facility], including address, phone number and hours of operation. Please do not delay care.  [facility]  If you or a family member do not have a primary care provider, use the link below to schedule a visit and establish care. When you choose a [facility] primary care physician or advanced practice provider, you gain a long-term partner in health. Find a Primary Care  Provider  Learn more about [facility]'s in-office and virtual care options: [facility]

## 2022-11-07 ENCOUNTER — Other Ambulatory Visit (HOSPITAL_COMMUNITY): Payer: Self-pay | Admitting: Family Medicine

## 2022-11-07 DIAGNOSIS — R928 Other abnormal and inconclusive findings on diagnostic imaging of breast: Secondary | ICD-10-CM

## 2022-11-07 DIAGNOSIS — Z1231 Encounter for screening mammogram for malignant neoplasm of breast: Secondary | ICD-10-CM

## 2022-11-11 ENCOUNTER — Inpatient Hospital Stay (HOSPITAL_COMMUNITY): Admission: RE | Admit: 2022-11-11 | Payer: BC Managed Care – PPO | Source: Ambulatory Visit

## 2022-11-11 DIAGNOSIS — Z1231 Encounter for screening mammogram for malignant neoplasm of breast: Secondary | ICD-10-CM

## 2022-11-15 ENCOUNTER — Telehealth: Payer: BC Managed Care – PPO | Admitting: Physician Assistant

## 2022-11-15 DIAGNOSIS — B9689 Other specified bacterial agents as the cause of diseases classified elsewhere: Secondary | ICD-10-CM

## 2022-11-15 DIAGNOSIS — J019 Acute sinusitis, unspecified: Secondary | ICD-10-CM | POA: Diagnosis not present

## 2022-11-15 MED ORDER — BENZONATATE 100 MG PO CAPS
100.0000 mg | ORAL_CAPSULE | Freq: Three times a day (TID) | ORAL | 0 refills | Status: DC | PRN
Start: 2022-11-15 — End: 2023-05-04

## 2022-11-15 MED ORDER — AMOXICILLIN-POT CLAVULANATE 875-125 MG PO TABS
1.0000 | ORAL_TABLET | Freq: Two times a day (BID) | ORAL | 0 refills | Status: DC
Start: 2022-11-15 — End: 2023-05-04

## 2022-11-15 MED ORDER — PREDNISONE 20 MG PO TABS
40.0000 mg | ORAL_TABLET | Freq: Every day | ORAL | 0 refills | Status: DC
Start: 2022-11-15 — End: 2023-05-04

## 2022-11-15 NOTE — Patient Instructions (Addendum)
Durene Cal, thank you for joining [name], PA-C for today's virtual visit.  While this provider is not your primary care provider (PCP), if your PCP is located in our provider database this encounter information will be shared with them immediately following your visit.   A [facility] MyChart account gives you access to today's visit and all your visits, tests, and labs performed at [facility] " click here if you don't have a [facility] [place] MyChart account or go to [email or web address]  Consent: (Patient) Julie Horn provided verbal consent for this virtual visit at the beginning of the encounter.  Current Medications:  Current Outpatient Medications:    albuterol (VENTOLIN HFA) 108 (90 Base) MCG/ACT inhaler, Inhale 2 puffs into the lungs every 4 (four) hours as needed for wheezing or shortness of breath., Disp: 6.7 g, Rfl: 2   amoxicillin-clavulanate (AUGMENTIN) 875-125 MG tablet, Take 1 tablet by mouth 2 (two) times daily., Disp: 20 tablet, Rfl: 0   B COMPLEX VITAMINS PO, Take by mouth., Disp: , Rfl:    benzonatate (TESSALON) 100 MG capsule, Take 1 capsule (100 mg total) by mouth 3 (three) times daily as needed., Disp: 30 capsule, Rfl: 0   fluconazole (DIFLUCAN) 150 MG tablet, Take 1 tablet (150 mg total) by mouth every 3 (three) days as needed., Disp: 3 tablet, Rfl: 0   fluticasone (FLONASE) 50 MCG/ACT nasal spray, Place 2 sprays into both nostrils daily., Disp: 16 g, Rfl: 0   hydrOXYzine (VISTARIL) 25 MG capsule, Take 1 capsule (25 mg total) by mouth every 8 (eight) hours as needed for anxiety., Disp: 30 capsule, Rfl: 5   montelukast (SINGULAIR) 10 MG tablet, Take 1 tablet (10 mg total) by mouth at bedtime., Disp: 90 tablet, Rfl: 3   Multiple Vitamin (MULTIVITAMIN) tablet, Take 1 tablet by mouth daily., Disp: , Rfl:    OVER THE COUNTER MEDICATION, 500 mg., Disp: , Rfl:    predniSONE (STERAPRED UNI-PAK 21 TAB) 10 MG (21) TBPK tablet, 6 day taper;  take as instructed on package instructions, Disp: 21 tablet, Rfl: 0   promethazine-dextromethorphan (PROMETHAZINE-DM) 6.25-15 MG/5ML syrup, Take 5 mLs by mouth 4 (four) times daily as needed., Disp: 118 mL, Rfl: 0   Medications ordered in this encounter:  No orders of the defined types were placed in this encounter.    *If you need refills on other medications prior to your next appointment, please contact your pharmacy*  Follow-Up: Call back or seek an in-person evaluation if the symptoms worsen or if the condition fails to improve as anticipated.  [facility] [phone]  Other Instructions Please take antibiotic as directed.  Increase fluid intake.  Use Saline nasal spray.  Take a daily multivitamin. Please keep up with your regular allergy medications. Start the prednisone and Tessalon as directed  Place a humidifier in the bedroom.  Please call or return clinic if symptoms are not improving.  Sinusitis Sinusitis is redness, soreness, and swelling (inflammation) of the paranasal sinuses. Paranasal sinuses are air pockets within the bones of your face (beneath the eyes, the middle of the forehead, or above the eyes). In healthy paranasal sinuses, mucus is able to drain out, and air is able to circulate through them by way of your nose. However, when your paranasal sinuses are inflamed, mucus and air can become trapped. This can allow bacteria and other germs to grow and cause infection. Sinusitis can develop quickly and last only a short time (acute) or continue  over a long period (chronic). Sinusitis that lasts for more than 12 weeks is considered chronic.  CAUSES  Causes of sinusitis include: Allergies. Structural abnormalities, such as displacement of the cartilage that separates your nostrils (deviated septum), which can decrease the air flow through your nose and sinuses and affect sinus drainage. Functional abnormalities, such as when the small hairs (cilia) that line  your sinuses and help remove mucus do not work properly or are not present. SYMPTOMS  Symptoms of acute and chronic sinusitis are the same. The primary symptoms are pain and pressure around the affected sinuses. Other symptoms include: Upper toothache. Earache. Headache. Bad breath. Decreased sense of smell and taste. A cough, which worsens when you are lying flat. Fatigue. Fever. Thick drainage from your nose, which often is green and may contain pus (purulent). Swelling and warmth over the affected sinuses. DIAGNOSIS  Your caregiver will perform a physical exam. During the exam, your caregiver may: Look in your nose for signs of abnormal growths in your nostrils (nasal polyps). Tap over the affected sinus to check for signs of infection. View the inside of your sinuses (endoscopy) with a special imaging device with a light attached (endoscope), which is inserted into your sinuses. If your caregiver suspects that you have chronic sinusitis, one or more of the following tests may be recommended: Allergy tests. Nasal culture A sample of mucus is taken from your nose and sent to a lab and screened for bacteria. Nasal cytology A sample of mucus is taken from your nose and examined by your caregiver to determine if your sinusitis is related to an allergy. TREATMENT  Most cases of acute sinusitis are related to a viral infection and will resolve on their own within 10 days. Sometimes medicines are prescribed to help relieve symptoms (pain medicine, decongestants, nasal steroid sprays, or saline sprays).  However, for sinusitis related to a bacterial infection, your caregiver will prescribe antibiotic medicines. These are medicines that will help kill the bacteria causing the infection.  Rarely, sinusitis is caused by a fungal infection. In theses cases, your caregiver will prescribe antifungal medicine. For some cases of chronic sinusitis, surgery is needed. Generally, these are cases in which  sinusitis recurs more than 3 times per year, despite other treatments. HOME CARE INSTRUCTIONS  Drink plenty of water. Water helps thin the mucus so your sinuses can drain more easily. Use a humidifier. Inhale steam 3 to 4 times a day (for example, sit in the bathroom with the shower running). Apply a warm, moist washcloth to your face 3 to 4 times a day, or as directed by your caregiver. Use saline nasal sprays to help moisten and clean your sinuses. Take over-the-counter or prescription medicines for pain, discomfort, or fever only as directed by your caregiver. SEEK IMMEDIATE MEDICAL CARE IF: You have increasing pain or severe headaches. You have nausea, vomiting, or drowsiness. You have swelling around your face. You have vision problems. You have a stiff neck. You have difficulty breathing. MAKE SURE YOU:  Understand these instructions. Will watch your condition. Will get help right away if you are not doing well or get worse. Document Released: 07/11/2005 Document Revised: 10/03/2011 Document Reviewed: 07/26/2011 [facility] Patient Information 2014 [place], [place].    If you have been instructed to have an in-person evaluation today at a local Urgent Care facility, please use the link below. It will take you to a list of all of our available [facility], including address, phone number and  hours of operation. Please do not delay care.  [facility]  If you or a family member do not have a primary care provider, use the link below to schedule a visit and establish care. When you choose a [facility] primary care physician or advanced practice provider, you gain a long-term partner in health. Find a Primary Care Provider  Learn more about [facility]'s in-office and virtual care options: [facility] Now

## 2022-11-15 NOTE — Progress Notes (Signed)
Virtual Visit Consent   Julie Horn, you are scheduled for a virtual visit with a [facility] provider today. Just as with appointments in the office, your consent must be obtained to participate. Your consent will be active for this visit and any virtual visit you may have with one of our providers in the next 365 days. If you have a MyChart account, a copy of this consent can be sent to you electronically.  As this is a virtual visit, video technology does not allow for your provider to perform a traditional examination. This may limit your provider's ability to fully assess your condition. If your provider identifies any concerns that need to be evaluated in person or the need to arrange testing (such as labs, EKG, etc.), we will make arrangements to do so. Although advances in technology are sophisticated, we cannot ensure that it will always work on either your end or our end. If the connection with a video visit is poor, the visit may have to be switched to a telephone visit. With either a video or telephone visit, we are not always able to ensure that we have a secure connection.  By engaging in this virtual visit, you consent to the provision of healthcare and authorize for your insurance to be billed (if applicable) for the services provided during this visit. Depending on your insurance coverage, you may receive a charge related to this service.  I need to obtain your verbal consent now. Are you willing to proceed with your visit today? Julie Horn has provided verbal consent on 11/15/2022 for a virtual visit (video or telephone). Julie Horn, [place]  Date: 11/15/2022 1:04 PM  Virtual Visit via Video Note   I, Julie Horn, connected with  Julie Horn  ([id number], [birth year]) on 11/15/22 at  1:00 PM EDT by a video-enabled telemedicine application and verified that I am speaking with the correct person using two identifiers.  Location: Patient: Virtual  Visit Location Patient: Home Provider: Virtual Visit Location Provider: Home Office   I discussed the limitations of evaluation and management by telemedicine and the availability of in person appointments. The patient expressed understanding and agreed to proceed.    History of Present Illness: Julie Horn is a 47 y.o. who identifies as a female who was assigned female at birth, and is being seen today for possible sinusitis. Patient endorses over the past few weeks noting increased in nasal congestion and allergy symptoms with acute worsening and change in the past week with sinus pressure, sinus pain, thick nasal discharge. Notes substantial fatigue. Also noting increased asthma symptoms with chest tightness and wheezing. Denies chest pain or overt SOB. Denies fever. Denies recent travel or sick contact.  OTC - Flonase, Phenylephrine in addition to her regular prescribed allergy medications.   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/17/2018   Fibroid uterus 04/17/2018    Allergies:  Allergies  Allergen Reactions   Latex  Levofloxacin    Sulfa Antibiotics    Azithromycin Rash   Doxycycline Rash   Medications:  Current Outpatient Medications:    amoxicillin-clavulanate (AUGMENTIN) 875-125 MG tablet, Take 1 tablet by mouth 2 (two) times daily., Disp: 14 tablet, Rfl: 0   benzonatate (TESSALON) 100 MG capsule, Take 1 capsule  (100 mg total) by mouth 3 (three) times daily as needed for cough., Disp: 30 capsule, Rfl: 0   predniSONE (DELTASONE) 20 MG tablet, Take 2 tablets (40 mg total) by mouth daily with breakfast., Disp: 10 tablet, Rfl: 0   albuterol (VENTOLIN HFA) 108 (90 Base) MCG/ACT inhaler, Inhale 2 puffs into the lungs every 4 (four) hours as needed for wheezing or shortness of breath., Disp: 6.7 g, Rfl: 2   fluticasone (FLONASE) 50 MCG/ACT nasal spray, Place 2 sprays into both nostrils daily., Disp: 16 g, Rfl: 0   montelukast (SINGULAIR) 10 MG tablet, Take 1 tablet (10 mg total) by mouth at bedtime., Disp: 90 tablet, Rfl: 3   Multiple Vitamin (MULTIVITAMIN) tablet, Take 1 tablet by mouth daily., Disp: , Rfl:   Observations/Objective: Patient is well-developed, well-nourished in no acute distress.  Resting comfortably at home.  Head is normocephalic, atraumatic.  No labored breathing. Speech is clear and coherent with logical content.  Patient is alert and oriented at baseline.  Assessment and Plan: 1. Acute bacterial sinusitis - amoxicillin-clavulanate (AUGMENTIN) 875-125 MG tablet; Take 1 tablet by mouth 2 (two) times daily.  Dispense: 14 tablet; Refill: 0 - benzonatate (TESSALON) 100 MG capsule; Take 1 capsule (100 mg total) by mouth 3 (three) times daily as needed for cough.  Dispense: 30 capsule; Refill: 0 - predniSONE (DELTASONE) 20 MG tablet; Take 2 tablets (40 mg total) by mouth daily with breakfast.  Dispense: 10 tablet; Refill: 0  Rx Augmentin.  Increase fluids.  Rest.  Saline nasal spray.  Probiotic.  Mucinex as directed.  Humidifier in bedroom. Prednisone and Tessalon per orders.  Call or return to clinic if symptoms are not improving.   Follow Up Instructions: I discussed the assessment and treatment plan with the patient. The patient was provided an opportunity to ask questions and all were answered. The patient agreed with the plan and demonstrated an understanding of the instructions.  A  copy of instructions were sent to the patient via MyChart unless otherwise noted below.   The patient was advised to call back or seek an in-person evaluation if the symptoms worsen or if the condition fails to improve as anticipated.  Time:  I spent 10 minutes with the patient via telehealth technology discussing the above problems/concerns.    Julie Climes, PA-C

## 2022-11-24 ENCOUNTER — Ambulatory Visit (HOSPITAL_COMMUNITY)
Admission: RE | Admit: 2022-11-24 | Discharge: 2022-11-24 | Disposition: A | Discharge status: Home / Self Care | Payer: BC Managed Care – PPO | Source: Ambulatory Visit | Attending: Family Medicine | Admitting: Family Medicine

## 2022-11-24 ENCOUNTER — Encounter (HOSPITAL_COMMUNITY): Payer: Self-pay

## 2022-11-24 DIAGNOSIS — R928 Other abnormal and inconclusive findings on diagnostic imaging of breast: Secondary | ICD-10-CM | POA: Insufficient documentation

## 2022-11-24 DIAGNOSIS — R92333 Mammographic heterogeneous density, bilateral breasts: Secondary | ICD-10-CM | POA: Diagnosis not present

## 2022-11-25 ENCOUNTER — Ambulatory Visit (HOSPITAL_COMMUNITY): Payer: BC Managed Care – PPO

## 2022-12-17 DIAGNOSIS — J0100 Acute maxillary sinusitis, unspecified: Secondary | ICD-10-CM | POA: Diagnosis not present

## 2022-12-17 DIAGNOSIS — H6992 Unspecified Eustachian tube disorder, left ear: Secondary | ICD-10-CM | POA: Diagnosis not present

## 2023-05-02 ENCOUNTER — Encounter: Payer: BC Managed Care – PPO | Admitting: Family Medicine

## 2023-05-04 ENCOUNTER — Ambulatory Visit (INDEPENDENT_AMBULATORY_CARE_PROVIDER_SITE_OTHER): Payer: BC Managed Care – PPO

## 2023-05-04 ENCOUNTER — Ambulatory Visit: Payer: BC Managed Care – PPO | Admitting: Family

## 2023-05-04 ENCOUNTER — Encounter: Payer: Self-pay | Admitting: Family

## 2023-05-04 VITALS — BP 126/84 | HR 78 | Temp 96.0°F | Ht 64.0 in | Wt 129.0 lb

## 2023-05-04 DIAGNOSIS — R519 Headache, unspecified: Secondary | ICD-10-CM

## 2023-05-04 DIAGNOSIS — M542 Cervicalgia: Secondary | ICD-10-CM

## 2023-05-04 DIAGNOSIS — M50221 Other cervical disc displacement at C4-C5 level: Secondary | ICD-10-CM | POA: Diagnosis not present

## 2023-05-04 MED ORDER — DICLOFENAC SODIUM 75 MG PO TBEC
75.0000 mg | DELAYED_RELEASE_TABLET | Freq: Two times a day (BID) | ORAL | 2 refills | Status: DC
Start: 2023-05-04 — End: 2023-05-18

## 2023-05-04 MED ORDER — PREDNISONE 10 MG (21) PO TBPK
ORAL_TABLET | ORAL | 0 refills | Status: DC
Start: 2023-05-04 — End: 2023-05-18

## 2023-05-04 MED ORDER — BACLOFEN 10 MG PO TABS
10.0000 mg | ORAL_TABLET | Freq: Three times a day (TID) | ORAL | 0 refills | Status: DC
Start: 2023-05-04 — End: 2023-05-22

## 2023-05-04 NOTE — Progress Notes (Signed)
Subjective:    Patient ID: Julie Horn, female    DOB: 1976-05-15, 47 y.o.   MRN: [id number]  Chief Complaint  Patient presents with   Neck Pain    Been going for months getting wrose    Pt presents to the office today with neck pain that started a few months. However, it has worsen. She reports headaches.  Neck Pain  This is a new problem. The current episode started more than 1 month ago. The problem occurs intermittently. The problem has been waxing and waning. The pain is associated with nothing. The pain is present in the anterior neck. The quality of the pain is described as aching. The pain is at a severity of 4/10. The pain is moderate. Associated symptoms include headaches. Pertinent negatives include no fever, numbness, syncope, tingling, trouble swallowing, visual change or weakness. She has tried NSAIDs, acetaminophen and muscle relaxants for the symptoms. The treatment provided mild relief.      Review of Systems  Constitutional:  Negative for fever.  HENT:  Negative for trouble swallowing.   Cardiovascular:  Negative for syncope.  Musculoskeletal:  Positive for neck pain.  Neurological:  Positive for headaches. Negative for tingling, weakness and numbness.  All other systems reviewed and are negative.      Objective:   Physical Exam Vitals reviewed.  Constitutional:      General: She is not in acute distress.    Appearance: She is well-developed.  HENT:     Head: Normocephalic and atraumatic.     Right Ear: Tympanic membrane normal.     Left Ear: Tympanic membrane normal.  Eyes:     Pupils: Pupils are equal, round, and reactive to light.  Neck:     Thyroid: No thyromegaly.  Cardiovascular:     Rate and Rhythm: Normal rate and regular rhythm.     Heart sounds: Normal heart sounds. No murmur heard. Pulmonary:     Effort: Pulmonary effort is normal. No respiratory distress.     Breath sounds: Normal breath sounds. No wheezing.  Abdominal:      General: Bowel sounds are normal. There is no distension.     Palpations: Abdomen is soft.     Tenderness: There is no abdominal tenderness.  Musculoskeletal:        General: Tenderness present.     Cervical back: Normal range of motion and neck supple.     Comments: Pain in posterior neck with flexion, extension, and rotation   Skin:    General: Skin is warm and dry.  Neurological:     Mental Status: She is alert and oriented to person, place, and time.     Cranial Nerves: No cranial nerve deficit.     Deep Tendon Reflexes: Reflexes are normal and symmetric.  Psychiatric:        Behavior: Behavior normal.        Thought Content: Thought content normal.        Judgment: Judgment normal.     BP 126/84   Pulse 78   Temp (!) 96 F (35.6 C) (Temporal)   Ht 5\' 4"  (1.626 m)   Wt 129 lb (58.5 kg)   SpO2 96%   BMI 22.14 kg/m        Assessment & Plan:  Julie Horn comes in today with chief complaint of Neck Pain (Been going for months getting wrose )   Diagnosis and orders addressed:  1. Neck pain - baclofen (LIORESAL) 10 MG  tablet; Take 1 tablet (10 mg total) by mouth 3 (three) times daily.  Dispense: 30 each; Refill: 0 - diclofenac (VOLTAREN) 75 MG EC tablet; Take 1 tablet (75 mg total) by mouth 2 (two) times daily.  Dispense: 60 tablet; Refill: 2 - predniSONE (STERAPRED UNI-PAK 21 TAB) 10 MG (21) TBPK tablet; Use as directed  Dispense: 21 tablet; Refill: 0 - Ambulatory referral to Orthopedic Surgery - DG Cervical Spine Complete  2. Acute intractable headache, unspecified headache type   Rest ROM exercises  Start diclofenac BID with food, no other NSAIDs  Prednisone   Referral to Ortho pending, pt states she can not afford PT at this time    Jannifer Rodney, FNP

## 2023-05-04 NOTE — Patient Instructions (Signed)
Degenerative Disk Disease  Degenerative disk disease is a condition caused by changes that occur in the spinal disks as a person ages. Spinal disks are soft and compressible disks located between the bones of your spine (vertebrae). These disks act like shock absorbers. Degenerative disk disease can affect the whole spine. However, the neck and lower back are most often affected. Many changes can occur in the spinal disks with aging, such as: The spinal disks may dry and shrink. Small tears may occur in the tough, outer covering of the disk (annulus). The disk space may become smaller due to loss of water. Abnormal growths in the bone (spurs) may occur. This can put pressure on the nerve roots exiting the spinal canal, causing pain. The spinal canal may become narrowed. What are the causes? This condition may be caused by: Normal degeneration with age. Injuries. Certain activities and sports that cause damage. What increases the risk? The following factors may make you more likely to develop this condition: Being overweight. Having a family history of degenerative disk disease. Smoking and use of products that contain nicotine and tobacco. Sudden injury. Doing work that requires heavy lifting. What are the signs or symptoms? Symptoms of this condition include: Pain that varies in intensity. Some people have no pain, while others have severe pain. The location of the pain depends on the part of your backbone that is affected. You may have: Pain in your neck or arm if a disk in your neck area is affected. Pain in your back, buttocks, or legs if a disk in your lower back is affected. Pain that becomes worse while bending or reaching up, or with twisting movements. Pain that may start gradually and worsen as time passes. It may also start after a major or minor injury. Numbness or tingling in the arms or legs. How is this diagnosed? This condition may be diagnosed based on: Your symptoms  and medical history. A physical exam. Imaging tests, including: X-ray of the spine. CT scan. MRI. How is this treated? This condition may be treated with: Medicines. Injection of steroids into the back. Rehabilitation exercises. These activities aim to strengthen muscles in your back and abdomen to better support your spine. If treatments do not help to relieve your symptoms or you have severe pain, you may need surgery. Follow these instructions at home: Medicines Take over-the-counter and prescription medicines only as told by your health care provider. Ask your health care provider if the medicine prescribed to you: Requires you to avoid driving or using machinery. Can cause constipation. You may need to take these actions to prevent or treat constipation: Drink enough fluid to keep your urine pale yellow. Take over-the-counter or prescription medicines. Eat foods that are high in fiber, such as beans, whole grains, and fresh fruits and vegetables. Limit foods that are high in fat and processed sugars, such as fried or sweet foods. Activity Rest as told by your health care provider. Avoid sitting for a long time without moving. Get up to take short walks every 1-2 hours. This is important to improve blood flow and breathing. Ask for help if you feel weak or unsteady. Return to your normal activities as told by your health care provider. Ask your health care provider what activities are safe for you. Perform relaxation exercises as told by your health care provider. Maintain good posture. Do not lift anything that is heavier than 10 lb (4.5 kg), or the limit that you are told, until your health   care provider says that it is safe. Follow proper lifting and walking techniques as told by your health care provider. Managing pain, stiffness, and swelling     If directed, put ice on the painful area. Icing can help to relieve pain. To do this: Put ice in a plastic bag. Place a towel  between your skin and the bag. Leave the ice on for 20 minutes, 2-3 times a day. Remove the ice if your skin turns bright red. This is very important. If you cannot feel pain, heat, or cold, you have a greater risk of damage to the area. If directed, apply heat to the painful area as often as told by your health care provider. Heat can reduce the stiffness of your muscles. Use the heat source that your health care provider recommends, such as a moist heat pack or a heating pad. Place a towel between your skin and the heat source. Leave the heat on for 20-30 minutes. Remove the heat if your skin turns bright red. This is especially important if you are unable to feel pain, heat, or cold. You may have a greater risk of getting burned. General instructions Change your sitting, standing, and sleeping habits as told by your health care provider. Avoid sitting in the same position for long periods of time. Change positions frequently. Lose weight or maintain a healthy weight as told by your health care provider. Do not use any products that contain nicotine or tobacco, such as cigarettes, e-cigarettes, and chewing tobacco. If you need help quitting, ask your health care provider. Wear supportive footwear. Keep all follow-up visits. This is important. This may include visits for physical therapy. Contact a health care provider if you: Have pain that does not go away within 1-4 weeks. Lose your appetite. Lose weight without trying. Get help right away if you: Have severe pain. Notice weakness in your arms, hands, or legs. Begin to lose control of your bladder or bowel movements. Have fevers or night sweats. Summary Degenerative disk disease is a condition caused by changes that occur in the spinal disks as a person ages. This condition can affect the whole spine. However, the neck and lower back are most often affected. Take over-the-counter and prescription medicines only as told by your health  care provider. This information is not intended to replace advice given to you by your health care provider. Make sure you discuss any questions you have with your health care provider. Document Revised: 10/24/2019 Document Reviewed: 10/24/2019 Elsevier Patient Education  2024 Elsevier Inc.  

## 2023-05-18 ENCOUNTER — Other Ambulatory Visit: Payer: Self-pay | Admitting: Medical Genetics

## 2023-05-18 ENCOUNTER — Encounter: Payer: Self-pay | Admitting: Orthopedic Surgery

## 2023-05-18 ENCOUNTER — Ambulatory Visit: Payer: BC Managed Care – PPO | Admitting: Orthopedic Surgery

## 2023-05-18 VITALS — BP 146/87 | HR 75 | Ht 64.0 in | Wt 131.0 lb

## 2023-05-18 DIAGNOSIS — M542 Cervicalgia: Secondary | ICD-10-CM | POA: Diagnosis not present

## 2023-05-18 DIAGNOSIS — Z006 Encounter for examination for normal comparison and control in clinical research program: Secondary | ICD-10-CM

## 2023-05-18 MED ORDER — METHOCARBAMOL 500 MG PO TABS
500.0000 mg | ORAL_TABLET | Freq: Three times a day (TID) | ORAL | 1 refills | Status: DC
Start: 2023-05-18 — End: 2023-12-04

## 2023-05-18 NOTE — Patient Instructions (Addendum)
Physical therapy has been ordered for you in [facility] . You will need to call and schedule the phone number is [phone]   We told them one visit for home exercise program, when you make appointment please let them know you can only come in for one visit for the exercise instruction   Stop Baclofen and will change to Robaxin // take Ibuprofen 800 mg 3 times daily with food

## 2023-05-18 NOTE — Progress Notes (Signed)
Office Visit Note   Patient: Julie Horn           Date of Birth: 11/09/1975           MRN: [id number] Visit Date: 05/18/2023 Requested by: Junie Spencer, FNP [address] [place],  [place] [address] PCP: Mechele Claude, MD   Assessment & Plan:   Encounter Diagnosis  Name Primary?   Neck pain Yes    Meds ordered this encounter  Medications   methocarbamol (ROBAXIN) 500 MG tablet    Sig: Take 1 tablet (500 mg total) by mouth 3 (three) times daily.    Dispense:  60 tablet    Refill:  1    22 female no signs of symptoms or physical findings that would warrant surgical intervention  Her main problem is she cannot go to therapy because of the cost  We recommended that she go 1 time for home exercises  She would like to switch to Robaxin which is appropriate Recommend heating pad Recommend switch to ibuprofen 800 with food  No follow-up recommended If she needs further care please send to neurosurgery or orthospine   Subjective: Chief Complaint  Patient presents with   Neck Pain    Neck pain going into the head with neck spasms with nerve pain over collarbone for 6 mos getting worse.     HPI: 47 year old female referred to [place] for neck pain  No history of trauma  She has been on steroids orally baclofen but she cannot afford physical therapy her symptoms include pain on the left side of her neck radiates up into her head              ROS: No weakness she does have some numbness and tingling in her shoulder blades   Images personally read and my interpretation : Outside imaging mild OA no disc space narrowing mild retrolisthesis C4 on 5  Visit Diagnoses:  1. Neck pain      Follow-Up Instructions: No follow-ups on file.    Objective: Vital Signs: BP (!) 146/87   Pulse 75   Ht 5\' 4"  (1.626 m)   Wt 131 lb (59.4 kg)   BMI 22.49 kg/m   Physical Exam Vitals and nursing note reviewed.  Constitutional:      Appearance: Normal appearance.   HENT:     Head: Normocephalic and atraumatic.  Eyes:     General: No scleral icterus.       Right eye: No discharge.        Left eye: No discharge.     Extraocular Movements: Extraocular movements intact.     Conjunctiva/sclera: Conjunctivae normal.     Pupils: Pupils are equal, round, and reactive to light.  Cardiovascular:     Rate and Rhythm: Normal rate.     Pulses: Normal pulses.  Skin:    General: Skin is warm and dry.     Capillary Refill: Capillary refill takes less than 2 seconds.  Neurological:     General: No focal deficit present.     Mental Status: She is alert and oriented to person, place, and time.  Psychiatric:        Mood and Affect: Mood normal.        Behavior: Behavior normal.        Thought Content: Thought content normal.        Judgment: Judgment normal.      Back Exam   Tenderness  The patient is experiencing tenderness  in the cervical.  Range of Motion  Extension:  abnormal  Flexion:  abnormal  Lateral bend right:  abnormal  Lateral bend left:  abnormal  Rotation right:  abnormal  Rotation left:  abnormal   Reflexes  Biceps:  normal  Other  Sensation: normal Gait: normal   Comments:  Triceps reflexes normal as well  The only real abnormal findings were increased muscle tension in the left side of the cervical spine       Specialty Comments:  No specialty comments available.  Imaging: No results found.   PMFS History: Patient Active Problem List   Diagnosis Date Noted   Situational anxiety 07/27/2021   Family history of breast cancer 07/06/2021   Bilateral hand pain 05/07/2019   Raynaud's phenomenon without gangrene 04/26/2019   Irregular menses 04/26/2019   Nicotine dependence with current use 04/17/2018   Intermittent asthma, uncomplicated 04/17/2018   GERD (gastroesophageal reflux disease) 04/17/2018   History of migraine 04/17/2018   Spondylolisthesis of lumbar region 04/17/2018   Degenerative joint disease  (DJD) of lumbar spine 04/17/2018   LGSIL on Pap smear of cervix 04/17/2018   Fibroid uterus 04/17/2018   Past Medical History:  Diagnosis Date   Abnormal Pap smear of cervix 04/17/2018   Asthma    GERD (gastroesophageal reflux disease)    History of migraine 04/17/2018   Had been managed on Topamax but had side effect. Now not active.   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 infertility

## 2023-05-22 ENCOUNTER — Encounter: Payer: Self-pay | Admitting: Family Medicine

## 2023-05-22 ENCOUNTER — Other Ambulatory Visit: Payer: Self-pay

## 2023-05-22 ENCOUNTER — Ambulatory Visit: Payer: BC Managed Care – PPO | Admitting: Family Medicine

## 2023-05-22 ENCOUNTER — Ambulatory Visit: Payer: BC Managed Care – PPO | Attending: Orthopedic Surgery | Admitting: Physical Therapy

## 2023-05-22 VITALS — BP 122/77 | HR 66 | Temp 98.0°F | Ht 64.0 in | Wt 132.0 lb

## 2023-05-22 DIAGNOSIS — Z13220 Encounter for screening for lipoid disorders: Secondary | ICD-10-CM

## 2023-05-22 DIAGNOSIS — Z0001 Encounter for general adult medical examination with abnormal findings: Secondary | ICD-10-CM | POA: Diagnosis not present

## 2023-05-22 DIAGNOSIS — E559 Vitamin D deficiency, unspecified: Secondary | ICD-10-CM

## 2023-05-22 DIAGNOSIS — Z1211 Encounter for screening for malignant neoplasm of colon: Secondary | ICD-10-CM

## 2023-05-22 DIAGNOSIS — J4520 Mild intermittent asthma, uncomplicated: Secondary | ICD-10-CM | POA: Diagnosis not present

## 2023-05-22 DIAGNOSIS — Z0000 Encounter for general adult medical examination without abnormal findings: Secondary | ICD-10-CM

## 2023-05-22 DIAGNOSIS — M542 Cervicalgia: Secondary | ICD-10-CM | POA: Insufficient documentation

## 2023-05-22 LAB — URINALYSIS
Bilirubin, UA: NEGATIVE
Glucose, UA: NEGATIVE
Ketones, UA: NEGATIVE
Leukocytes,UA: NEGATIVE
Nitrite, UA: NEGATIVE
Protein,UA: NEGATIVE
RBC, UA: NEGATIVE
Specific Gravity, UA: 1.01 (ref 1.005–1.030)
Urobilinogen, Ur: 0.2 mg/dL (ref 0.2–1.0)
pH, UA: 7 (ref 5.0–7.5)

## 2023-05-22 MED ORDER — TRAZODONE HCL 150 MG PO TABS: ORAL_TABLET | ORAL | 5 refills | Status: DC

## 2023-05-22 MED ORDER — MONTELUKAST SODIUM 10 MG PO TABS: 10.0000 mg | ORAL_TABLET | Freq: Every day | ORAL | 3 refills | Status: DC

## 2023-05-22 MED ORDER — ALBUTEROL SULFATE HFA 108 (90 BASE) MCG/ACT IN AERS
2.0000 | INHALATION_SPRAY | RESPIRATORY_TRACT | 2 refills | Status: DC | PRN
Start: 2023-05-22 — End: 2024-02-07

## 2023-05-22 MED ORDER — FLUTICASONE FUROATE-VILANTEROL 100-25 MCG/ACT IN AEPB: 1.0000 | INHALATION_SPRAY | Freq: Every day | RESPIRATORY_TRACT | 11 refills | Status: DC

## 2023-05-22 NOTE — Therapy (Signed)
OUTPATIENT PHYSICAL THERAPY CERVICAL EVALUATION   Patient Name: Julie Horn MRN: [id number] DOB:Jun 24, 1976, 47 y.o., female Today's Date: 05/22/2023  END OF SESSION:  PT End of Session - 05/22/23 1336     Visit Number 1    Number of Visits 4    Date for PT Re-Evaluation 06/19/23    PT Start Time 1255    PT Stop Time 1333    PT Time Calculation (min) 38 min    Activity Tolerance Patient tolerated treatment well    Behavior During Therapy [facility] for tasks assessed/performed             Past Medical History:  Diagnosis Date   Abnormal Pap smear of cervix 04/17/2018   Asthma    GERD (gastroesophageal reflux disease)    History of migraine 04/17/2018   Had been managed on Topamax but had side effect. Now not active.   Raynaud's disease    Spondylolisthesis of lumbar region 04/17/2018   Past Surgical History:  Procedure Laterality Date   LAPAROSCOPY     TONSILLECTOMY AND ADENOIDECTOMY  1983   Patient Active Problem List   Diagnosis Date Noted   Situational anxiety 07/27/2021   Family history of breast cancer 07/06/2021   Bilateral hand pain 05/07/2019   Raynaud's phenomenon without gangrene 04/26/2019   Irregular menses 04/26/2019   Nicotine dependence with current use 04/17/2018   Intermittent asthma, uncomplicated 04/17/2018   GERD (gastroesophageal reflux disease) 04/17/2018   History of migraine 04/17/2018   Spondylolisthesis of lumbar region 04/17/2018   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   LGSIL on Pap smear of cervix 04/17/2018   Fibroid uterus 04/17/2018    REFERRING PROVIDER: Fuller Canada MD  REFERRING DIAG: Neck pain.  THERAPY DIAG:  Cervicalgia  Rationale for Evaluation and Treatment: Rehabilitation  ONSET DATE: Several months.  SUBJECTIVE:                                                                                                                                                                                                          SUBJECTIVE STATEMENT: The patient presents to the clinic today with c/o neck pain that has been ongoing for several months.  She rates her pain at a 3-4/10 today but can become a 10/10 when she develops headaches.  The pain tends to start in the back of her head and goes over the crown of her head to there forward.  She works at a [employer] and tries to maintain a good posture.  She has also been trying  to find a good pillow but has found one that works well for her yet.  Ibuprofen and ice helps some to decrease her pain.  Neck movements can increase her pain especially looking up and down for extended periods of time.   PERTINENT HISTORY:  See above.  PAIN:  Are you having pain? Yes: NPRS scale: 3-4/10 Pain location: Neck. Pain description: Ache, sore. Aggravating factors: As above. Relieving factors: As above.  PRECAUTIONS: None     WEIGHT BEARING RESTRICTIONS: No  FALLS:  Has patient fallen in last 6 months? No  LIVING ENVIRONMENT: Lives in: House/apartment Stairs: No  OCCUPATION: Works at a [employer].  PLOF: Independent  PATIENT GOALS: Not have pain and especially headaches that can get so bad she can become nauseous.      OBJECTIVE:  Note: Objective measures were completed at Evaluation unless otherwise noted.  DIAGNOSTIC FINDINGS:  05/12/23:  FINDINGS: The cervical spine is imaged through the C7 vertebral body in the lateral projection. Vertebral body heights are preserved. There is trace retrolisthesis of C4 on C5. Alignment is otherwise normal. The disc heights are preserved. There is no significant facet arthropathy. There is no osseous neural foraminal stenosis.   The prevertebral soft tissues are unremarkable. The imaged lungs clear.   IMPRESSION: Trace retrolisthesis of C4 on C5. Otherwise, unremarkable cervical spine radiographs with no acute finding or significant degenerative change.  POSTURE:  Minimal forward head  posture.  PALPATION: Tender to palpation over bilateral suboccipital region and associated musculature.     CERVICAL ROM:   Active ROM A/PROM (deg) eval  Flexion WFL  Extension 25  Right lateral flexion WFL  Left lateral flexion WFL  Right rotation 55  Left rotation 55   (Blank rows = not tested)  UPPER EXTREMITY ROM:  WNL.  UPPER EXTREMITY MMT:  Normal UE strength.  DTR's:  Normal bilateral UE DTR's.    TODAY'S TREATMENT:                                                                                                                              DATE: Evaluation and instruct in HEP.   PATIENT EDUCATION:  Education details: See below. Person educated: Patient Education method: Explanation, Demonstration, and Handouts Education comprehension: verbalized understanding and returned demonstration  HOME EXERCISE PROGRAM: HOME EXERCISE PROGRAM Created by [place] Rashied Corallo Oct 28th, 2024 View at [email or web address] using code: [email or web address] Total 3 Page 1 of 1 RETRACTION / CHIN TUCK Slowly draw your head back so that your ears line up with your shoulders. Repeat 10 Times Hold 5 Seconds Complete 1 Set Perform 6 Times a Day CERVICAL EXTENSION WITH TOWEL - CURVE OF NECK Start with a small hand towel wrapped around the curve of your neck and holding the ends of the towel forward as shown. Next, extend your neck back over the towel as to look up at the ceiling. Then, return to starting position.  Your hands should remain still  and holding the ends of the towel the entire time. Repeat 5 Times Hold 2 Seconds Complete 1 Set Perform 3 Times a Day CERVICAL ROTATION Turn your head towards the side, then return back to looking straight ahead. Take two fingers and gently move neck a bit farther. Repeat 5 Times Hold 5 Seconds Complete 2 Sets Perform 6 Times a Da  ASSESSMENT:  CLINICAL IMPRESSION: The patient presents to OPPT with c/o neck pain and occasional headaches that can  become severe.  She has found to have significant losses of active cervical range of motion.  We went through an HEP as she may only attend one session.  We discussed the importance of correct posture which she is well educated in.  She has c/o pain over her bilateral suboccipital region.  She reports occasions on muscle spasm and pointe dto her left SCM.  She has also been trying to find a good pillow.  She exhibits normal UE strength and normal DTR's.    OBJECTIVE IMPAIRMENTS: decreased activity tolerance, decreased ROM, increased muscle spasms, and pain.   ACTIVITY LIMITATIONS: sleeping  REHAB POTENTIAL: Good  CLINICAL DECISION MAKING: Evolving/moderate complexity  EVALUATION COMPLEXITY: Low   GOALS:  SHORT TERM GOALS: Target date: 06/19/23  Ind with a HEP.  Goal status: INITIAL  2.  Will update goals should patient want further treatment.  Goal status: INITIAL   PLAN:  PT DURATION: other: 4 visits.  PLANNED INTERVENTIONS: 97110-Therapeutic exercises, 97530- Therapeutic activity, [id number]- Neuromuscular re-education, 97535- Self Care, 16010- Manual therapy, 97014- Electrical stimulation (unattended), 97035- Ultrasound, 93235- Traction (mechanical), Patient/Family education, Dry Needling, Cryotherapy, and Moist heat  PLAN FOR NEXT SESSION: Review HEP, STW/M, suboccipital release technique.   Aliviyah Malanga, [place], PT 05/22/2023, 3:11 PM

## 2023-05-22 NOTE — Progress Notes (Signed)
Subjective:  Patient ID: Julie Horn, female    DOB: [birth year]  Age: 47 y.o. MRN: [id number]  CC: Annual Exam   HPI Julie Horn presents for Annual exam. Not exercising due to DOE. Albuterol makes her jittery. Planning to quit smoking.  Bp 130s/80s Mom passed from breast Ca last year. Planning Genetic testing. Will have pap/breast with GYN     05/22/2023   10:09 AM 01/04/2022   11:39 AM 07/06/2021   11:48 AM  Depression screen PHQ 2/9  Decreased Interest 1 0 1  Down, Depressed, Hopeless 0 0 1  PHQ - 2 Score 1 0 2  Altered sleeping 3 0 0  Tired, decreased energy 2 0 0  Change in appetite 0 1 0  Feeling bad or failure about yourself  0 0 0  Trouble concentrating 1 1 0  Moving slowly or fidgety/restless 0 0 0  Suicidal thoughts 0 0 0  PHQ-9 Score 7 2 2   Difficult doing work/chores Somewhat difficult Not difficult at all Somewhat difficult    History Lenyx has a past medical history of Abnormal Pap smear of cervix (04/17/2018), Asthma, GERD (gastroesophageal reflux disease), History of migraine (04/17/2018), Raynaud's disease, and Spondylolisthesis of lumbar region (04/17/2018).   She has a past surgical history that includes Tonsillectomy and adenoidectomy (1983) and laparoscopy.   Her family history includes Arthritis in her brother; Asthma in her brother and mother; Breast cancer in her mother; Cancer in her maternal grandmother; Ehlers-Danlos syndrome in her mother; Heart attack in her maternal grandfather; Polymyalgia rheumatica in her mother.She reports that she has been smoking cigarettes. She has a 20 pack-year smoking history. She has never used smokeless tobacco. She reports current alcohol use. She reports that she does not currently use drugs.    ROS Review of Systems  Constitutional:  Negative for appetite change, chills, diaphoresis, fatigue, fever and unexpected weight change.  HENT:  Negative for congestion, ear pain, hearing loss, postnasal  drip, rhinorrhea, sneezing, sore throat and trouble swallowing.   Eyes:  Negative for pain.  Respiratory:  Positive for shortness of breath (intermittent asthma. Relief with albuterol, but makes her jittery). Negative for cough and chest tightness.   Cardiovascular:  Negative for chest pain and palpitations.  Gastrointestinal:  Negative for abdominal pain, constipation, diarrhea, nausea and vomiting.  Endocrine: Negative for cold intolerance, heat intolerance, polydipsia, polyphagia and polyuria.  Genitourinary:  Negative for dysuria, frequency and menstrual problem.  Musculoskeletal:  Positive for neck pain. Negative for arthralgias and joint swelling.  Skin:  Negative for rash.  Allergic/Immunologic: Negative for environmental allergies.  Neurological:  Negative for dizziness, weakness, numbness and headaches.  Psychiatric/Behavioral:  Positive for sleep disturbance (related to neck pain). Negative for agitation and dysphoric mood.     Objective:  BP 122/77   Pulse 66   Temp 98 F (36.7 C)   Ht 5\' 4"  (1.626 m)   Wt 132 lb (59.9 kg)   SpO2 98%   BMI 22.66 kg/m   BP Readings from Last 3 Encounters:  05/22/23 122/77  05/18/23 (!) 146/87  05/04/23 126/84    Wt Readings from Last 3 Encounters:  05/22/23 132 lb (59.9 kg)  05/18/23 131 lb (59.4 kg)  05/04/23 129 lb (58.5 kg)     Physical Exam Constitutional:      General: She is not in acute distress.    Appearance: She is well-developed.  HENT:     Head: Normocephalic and atraumatic.  Eyes:  Conjunctiva/sclera: Conjunctivae normal.     Pupils: Pupils are equal, round, and reactive to light.  Neck:     Thyroid: No thyromegaly.  Cardiovascular:     Rate and Rhythm: Normal rate and regular rhythm.     Heart sounds: Normal heart sounds. No murmur heard. Pulmonary:     Effort: Pulmonary effort is normal. No respiratory distress.     Breath sounds: Normal breath sounds. No wheezing or rales.  Abdominal:     General:  Bowel sounds are normal. There is no distension.     Palpations: Abdomen is soft.     Tenderness: There is no abdominal tenderness.  Musculoskeletal:        General: Normal range of motion.     Cervical back: Normal range of motion and neck supple.  Lymphadenopathy:     Cervical: No cervical adenopathy.  Skin:    General: Skin is warm and dry.  Neurological:     Mental Status: She is alert and oriented to person, place, and time.  Psychiatric:        Behavior: Behavior normal.        Thought Content: Thought content normal.        Judgment: Judgment normal.       Assessment & Plan:   Myree was seen today for annual exam.  Diagnoses and all orders for this visit:  Well adult exam -     CBC with Differential/Platelet -     CMP14+EGFR -     Lipid panel -     VITAMIN D 25 Hydroxy (Vit-D Deficiency, Fractures) -     Urinalysis  Mild intermittent asthma without complication -     albuterol (VENTOLIN HFA) 108 (90 Base) MCG/ACT inhaler; Inhale 2 puffs into the lungs every 4 (four) hours as needed for wheezing or shortness of breath. -     montelukast (SINGULAIR) 10 MG tablet; Take 1 tablet (10 mg total) by mouth at bedtime. -     CBC with Differential/Platelet -     CMP14+EGFR -     Lipid panel -     Urinalysis  Screen for colon cancer -     Cologuard  Vitamin D deficiency -     VITAMIN D 25 Hydroxy (Vit-D Deficiency, Fractures)  Neck pain -     CBC with Differential/Platelet -     CMP14+EGFR  Screening, lipid -     Lipid panel  Other orders -     traZODone (DESYREL) 150 MG tablet; Use from 1/3 to 1 tablet nightly as needed for sleep. -     fluticasone furoate-vilanterol (BREO ELLIPTA) 100-25 MCG/ACT AEPB; Inhale 1 puff into the lungs daily.       I have discontinued Briella Y. Threats's baclofen. I am also having her start on traZODone and fluticasone furoate-vilanterol. Additionally, I am having her maintain her multivitamin, fluticasone, methocarbamol,  albuterol, and montelukast.  Allergies as of 05/22/2023       Reactions   Latex    Levofloxacin    Sulfa Antibiotics    Azithromycin Rash   Doxycycline Rash        Medication List        Accurate as of May 22, 2023 10:45 AM. If you have any questions, ask your nurse or doctor.          STOP taking these medications    baclofen 10 MG tablet Commonly known as: LIORESAL Stopped by: [employer]  TAKE these medications    albuterol 108 (90 Base) MCG/ACT inhaler Commonly known as: VENTOLIN HFA Inhale 2 puffs into the lungs every 4 (four) hours as needed for wheezing or shortness of breath.   fluticasone 50 MCG/ACT nasal spray Commonly known as: FLONASE Place 2 sprays into both nostrils daily.   fluticasone furoate-vilanterol 100-25 MCG/ACT Aepb Commonly known as: Breo Ellipta Inhale 1 puff into the lungs daily. Started by: Bryauna Byrum   methocarbamol 500 MG tablet Commonly known as: ROBAXIN Take 1 tablet (500 mg total) by mouth 3 (three) times daily.   montelukast 10 MG tablet Commonly known as: SINGULAIR Take 1 tablet (10 mg total) by mouth at bedtime.   multivitamin tablet Take 1 tablet by mouth daily.   traZODone 150 MG tablet Commonly known as: DESYREL Use from 1/3 to 1 tablet nightly as needed for sleep. Started by: Broadus John Furkan Keenum         Follow-up: Return in about 1 year (around 05/21/2024) for [facility] physical.  Mechele Claude, M.D.

## 2023-05-23 LAB — CMP14+EGFR
ALT: 23 IU/L (ref 0–32)
AST: 15 [IU]/L (ref 0–40)
Albumin: 4.8 g/dL (ref 3.9–4.9)
Alkaline Phosphatase: 75 [IU]/L (ref 44–121)
BUN/Creatinine Ratio: 10 (ref 9–23)
BUN: 7 mg/dL (ref 6–24)
Bilirubin Total: 0.5 mg/dL (ref 0.0–1.2)
CO2: 22 mmol/L (ref 20–29)
Calcium: 9.9 mg/dL (ref 8.7–10.2)
Chloride: 104 mmol/L (ref 96–106)
Creatinine, Ser: 0.71 mg/dL (ref 0.57–1.00)
Globulin, Total: 2.6 g/dL (ref 1.5–4.5)
Glucose: 97 mg/dL (ref 70–99)
Potassium: 4.5 mmol/L (ref 3.5–5.2)
Sodium: 145 mmol/L — ABNORMAL HIGH (ref 134–144)
Total Protein: 7.4 g/dL (ref 6.0–8.5)
eGFR: 106 mL/min/{1.73_m2} (ref >59)

## 2023-05-23 LAB — CBC WITH DIFFERENTIAL/PLATELET
Basophils Absolute: 0.1 10*3/uL (ref 0.0–0.2)
Basos: 1 %
EOS (ABSOLUTE): 0.2 10*3/uL (ref 0.0–0.4)
Eos: 3 %
Hematocrit: 46.7 % — ABNORMAL HIGH (ref 34.0–46.6)
Hemoglobin: 15.6 g/dL (ref 11.1–15.9)
Immature Grans (Abs): 0 10*3/uL (ref 0.0–0.1)
Immature Granulocytes: 0 %
Lymphocytes Absolute: 1.3 10*3/uL (ref 0.7–3.1)
Lymphs: 20 %
MCH: 32.5 pg (ref 26.6–33.0)
MCHC: 33.4 g/dL (ref 31.5–35.7)
MCV: 97 fL (ref 79–97)
Monocytes Absolute: 0.5 10*3/uL (ref 0.1–0.9)
Monocytes: 8 %
Neutrophils Absolute: 4.3 10*3/uL (ref 1.4–7.0)
Neutrophils: 68 %
Platelets: 231 10*3/uL (ref 150–450)
RBC: 4.8 x10E6/uL (ref 3.77–5.28)
RDW: 12.9 % (ref 11.7–15.4)
WBC: 6.3 10*3/uL (ref 3.4–10.8)

## 2023-05-23 LAB — LIPID PANEL
Chol/HDL Ratio: 2.4 ratio (ref 0.0–4.4)
Cholesterol, Total: 212 mg/dL — ABNORMAL HIGH (ref 100–199)
HDL: 89 mg/dL (ref >39)
LDL Chol Calc (NIH): 107 mg/dL — ABNORMAL HIGH (ref 0–99)
Triglycerides: 93 mg/dL (ref 0–149)
VLDL Cholesterol Cal: 16 mg/dL (ref 5–40)

## 2023-05-23 LAB — VITAMIN D 25 HYDROXY (VIT D DEFICIENCY, FRACTURES): Vit D, 25-Hydroxy: 52.7 ng/mL (ref 30.0–100.0)

## 2023-05-29 ENCOUNTER — Other Ambulatory Visit (HOSPITAL_COMMUNITY): Payer: Self-pay

## 2023-05-30 ENCOUNTER — Other Ambulatory Visit (HOSPITAL_COMMUNITY): Payer: Self-pay

## 2023-06-01 ENCOUNTER — Other Ambulatory Visit (HOSPITAL_COMMUNITY)
Admission: RE | Admit: 2023-06-01 | Discharge: 2023-06-01 | Disposition: A | Discharge status: Home / Self Care | Payer: Self-pay | Source: Ambulatory Visit | Attending: Medical Genetics | Admitting: Medical Genetics

## 2023-06-01 DIAGNOSIS — Z006 Encounter for examination for normal comparison and control in clinical research program: Secondary | ICD-10-CM | POA: Insufficient documentation

## 2023-06-09 LAB — HELIX MOLECULAR SCREEN: Genetic Analysis Overall Interpretation: NEGATIVE

## 2023-06-09 LAB — GENECONNECT MOLECULAR SCREEN

## 2023-06-28 DIAGNOSIS — Z789 Other specified health status: Secondary | ICD-10-CM | POA: Diagnosis not present

## 2023-06-28 DIAGNOSIS — D2372 Other benign neoplasm of skin of left lower limb, including hip: Secondary | ICD-10-CM | POA: Diagnosis not present

## 2023-06-28 DIAGNOSIS — R208 Other disturbances of skin sensation: Secondary | ICD-10-CM | POA: Diagnosis not present

## 2023-06-28 DIAGNOSIS — L538 Other specified erythematous conditions: Secondary | ICD-10-CM | POA: Diagnosis not present

## 2023-06-28 DIAGNOSIS — D1801 Hemangioma of skin and subcutaneous tissue: Secondary | ICD-10-CM | POA: Diagnosis not present

## 2023-07-17 ENCOUNTER — Encounter: Payer: Self-pay | Admitting: Family Medicine

## 2023-07-17 ENCOUNTER — Ambulatory Visit: Payer: BC Managed Care – PPO | Admitting: Family Medicine

## 2023-07-17 VITALS — BP 134/73 | HR 92 | Temp 97.6°F | Ht 64.0 in | Wt 131.2 lb

## 2023-07-17 DIAGNOSIS — K439 Ventral hernia without obstruction or gangrene: Secondary | ICD-10-CM

## 2023-07-17 NOTE — Progress Notes (Signed)
Subjective:  Patient ID: Julie Horn, female    DOB: 1975/12/16  Age: 46 y.o. MRN: [id number]  CC: Hernia   HPI Julie Horn presents for RUQ cramping from hernia. Feels it more due to cough of bronchitis. Little nodule pops out. Gets a sense of pressure. 2-3/10 for severity of pain.      07/17/2023    1:23 PM 05/22/2023   10:09 AM 01/04/2022   11:39 AM  Depression screen PHQ 2/9  Decreased Interest 0 1 0  Down, Depressed, Hopeless 1 0 0  PHQ - 2 Score 1 1 0  Altered sleeping 0 3 0  Tired, decreased energy 1 2 0  Change in appetite 1 0 1  Feeling bad or failure about yourself  0 0 0  Trouble concentrating 1 1 1   Moving slowly or fidgety/restless 0 0 0  Suicidal thoughts 0 0 0  PHQ-9 Score 4 7 2   Difficult doing work/chores Somewhat difficult Somewhat difficult Not difficult at all    History Julie Horn has a past medical history of Abnormal Pap smear of cervix (04/17/2018), Asthma, GERD (gastroesophageal reflux disease), History of migraine (04/17/2018), Raynaud's disease, and Spondylolisthesis of lumbar region (04/17/2018).   Julie Horn has a past surgical history that includes Tonsillectomy and adenoidectomy (1983) and laparoscopy.   Her family history includes Arthritis in her brother; Asthma in her brother and mother; Breast cancer in her mother; Cancer in her maternal grandmother; Ehlers-Danlos syndrome in her mother; Heart attack in her maternal grandfather; Polymyalgia rheumatica in her mother.Julie Horn reports that Julie Horn has been smoking cigarettes. Julie Horn has a 20 pack-year smoking history. Julie Horn has never used smokeless tobacco. Julie Horn reports current alcohol use. Julie Horn reports that Julie Horn does not currently use drugs.    ROS Review of Systems  Gastrointestinal:  Positive for abdominal distention (at the right upper quadrant, can feel a twisting, pressure and had a bulging once.). Negative for abdominal pain, diarrhea, nausea, rectal pain and vomiting.    Objective:  BP 134/73    Pulse 92   Temp 97.6 F (36.4 C)   Ht 5\' 4"  (1.626 m)   Wt 131 lb 3.2 oz (59.5 kg)   SpO2 94%   BMI 22.52 kg/m   BP Readings from Last 3 Encounters:  07/17/23 134/73  05/22/23 122/77  05/18/23 (!) 146/87    Wt Readings from Last 3 Encounters:  07/17/23 131 lb 3.2 oz (59.5 kg)  05/22/23 132 lb (59.9 kg)  05/18/23 131 lb (59.4 kg)     Physical Exam Abdominal:     General: Abdomen is flat. There is no distension.     Palpations: Abdomen is soft.     Tenderness: There is no abdominal tenderness.     Hernia: A hernia (minimal RUQ ventral separation of muscle at aponeurosis) is present.       Assessment & Plan:   Julie Horn was seen today for hernia.  Diagnoses and all orders for this visit:  Ventral hernia without obstruction or gangrene    Reassurred that this is benign. Follow up if area gets larger, pain gets more intense or frequent.    I am having Julie Horn maintain her multivitamin, fluticasone, methocarbamol, albuterol, montelukast, traZODone, and fluticasone furoate-vilanterol.  Allergies as of 07/17/2023       Reactions   Latex    Levofloxacin    Sulfa Antibiotics    Azithromycin Rash   Doxycycline Rash        Medication List  Accurate as of July 17, 2023  1:53 PM. If you have any questions, ask your nurse or doctor.          albuterol 108 (90 Base) MCG/ACT inhaler Commonly known as: VENTOLIN HFA Inhale 2 puffs into the lungs every 4 (four) hours as needed for wheezing or shortness of breath.   fluticasone 50 MCG/ACT nasal spray Commonly known as: FLONASE Place 2 sprays into both nostrils daily.   fluticasone furoate-vilanterol 100-25 MCG/ACT Aepb Commonly known as: Breo Ellipta Inhale 1 puff into the lungs daily.   methocarbamol 500 MG tablet Commonly known as: ROBAXIN Take 1 tablet (500 mg total) by mouth 3 (three) times daily.   montelukast 10 MG tablet Commonly known as: SINGULAIR Take 1 tablet (10 mg total) by  mouth at bedtime.   multivitamin tablet Take 1 tablet by mouth daily.   traZODone 150 MG tablet Commonly known as: DESYREL Use from 1/3 to 1 tablet nightly as needed for sleep.         Follow-up: Return if symptoms worsen or fail to improve.  Mechele Claude, M.D.

## 2023-08-08 ENCOUNTER — Ambulatory Visit: Payer: BC Managed Care – PPO | Admitting: Family Medicine

## 2023-09-20 DIAGNOSIS — J329 Chronic sinusitis, unspecified: Secondary | ICD-10-CM | POA: Diagnosis not present

## 2023-09-20 DIAGNOSIS — Z20822 Contact with and (suspected) exposure to covid-19: Secondary | ICD-10-CM | POA: Diagnosis not present

## 2023-09-20 DIAGNOSIS — R519 Headache, unspecified: Secondary | ICD-10-CM | POA: Diagnosis not present

## 2023-09-23 IMAGING — MG MM DIGITAL SCREENING BILAT W/ TOMO AND CAD
8 series · 9 of 24 positions shown · non-contrast
Comparison: None.

CLINICAL DATA: Screening.

EXAM:
DIGITAL SCREENING BILATERAL MAMMOGRAM WITH TOMOSYNTHESIS AND CAD
TECHNIQUE: Bilateral screening digital craniocaudal and mediolateral oblique
mammograms were obtained. Bilateral screening digital breast
tomosynthesis was performed. The images were evaluated with
computer-aided detection.

[L MLO synth-2D]
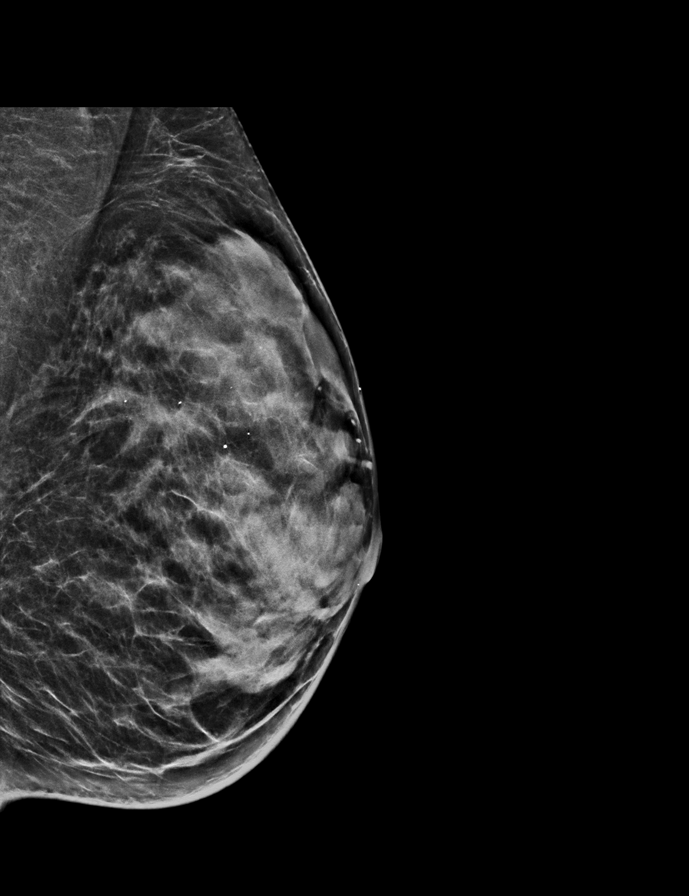

[L CC synth-2D]
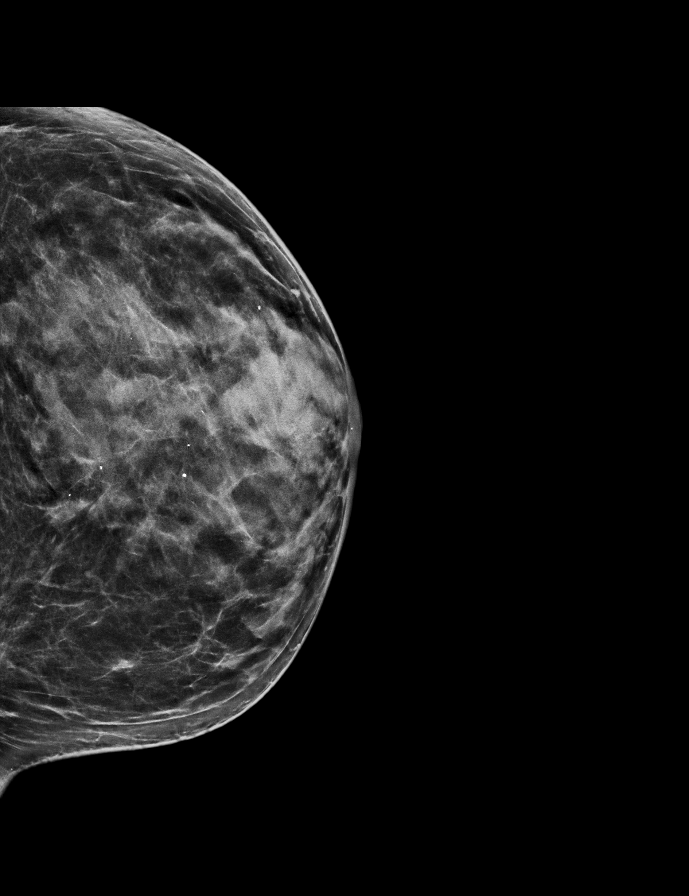

[R CC synth-2D]
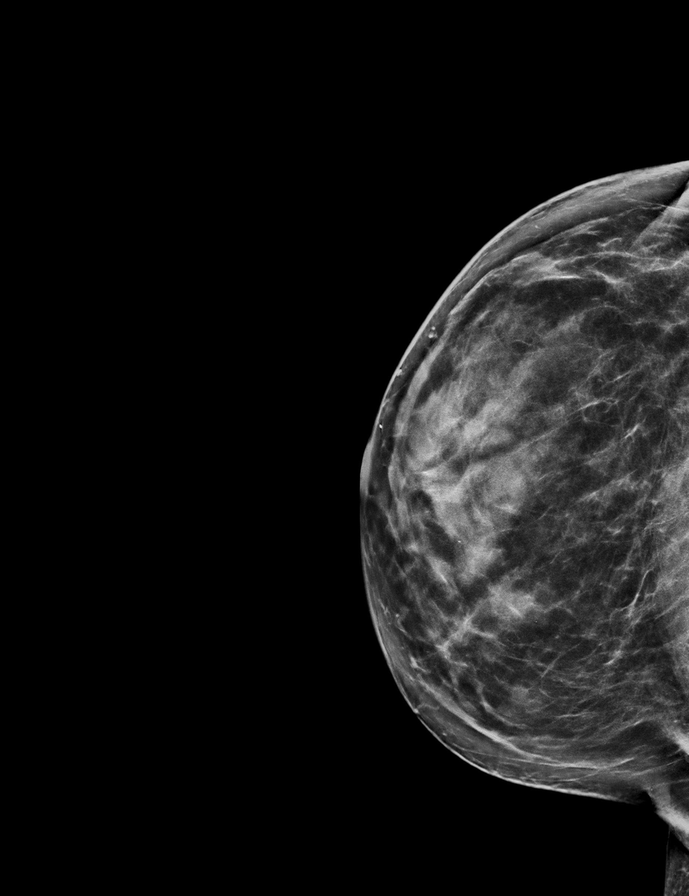

[R MLO synth-2D]
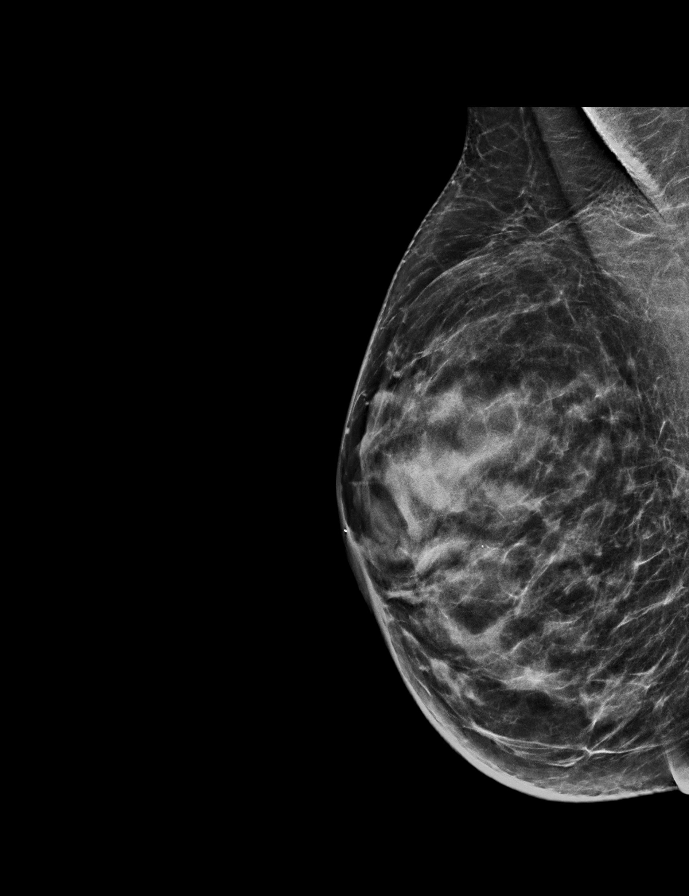

[L MLO tomo · 2 of 56 frames shown]
[frame 19/56]
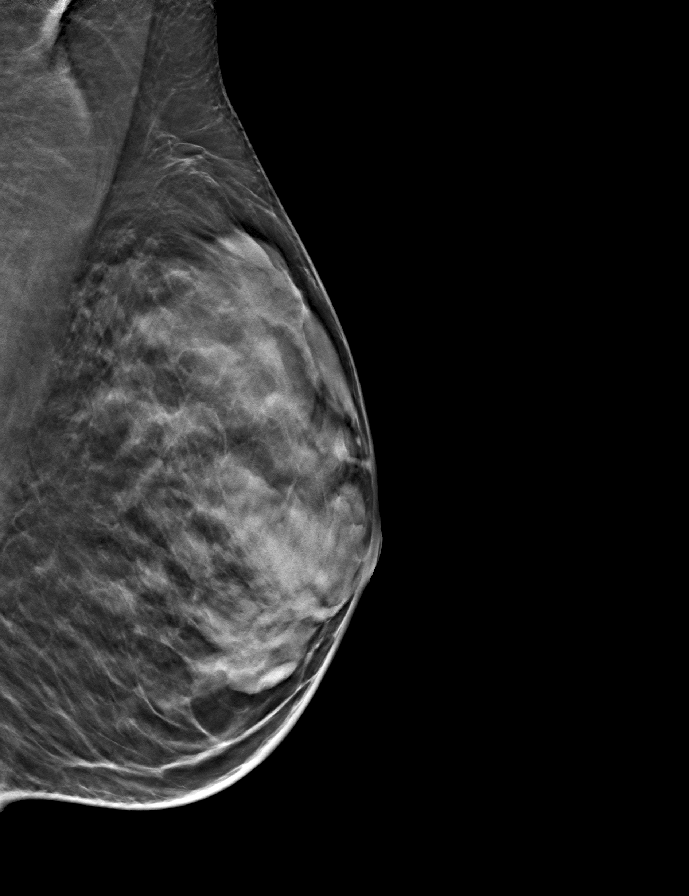
[frame 29/56]
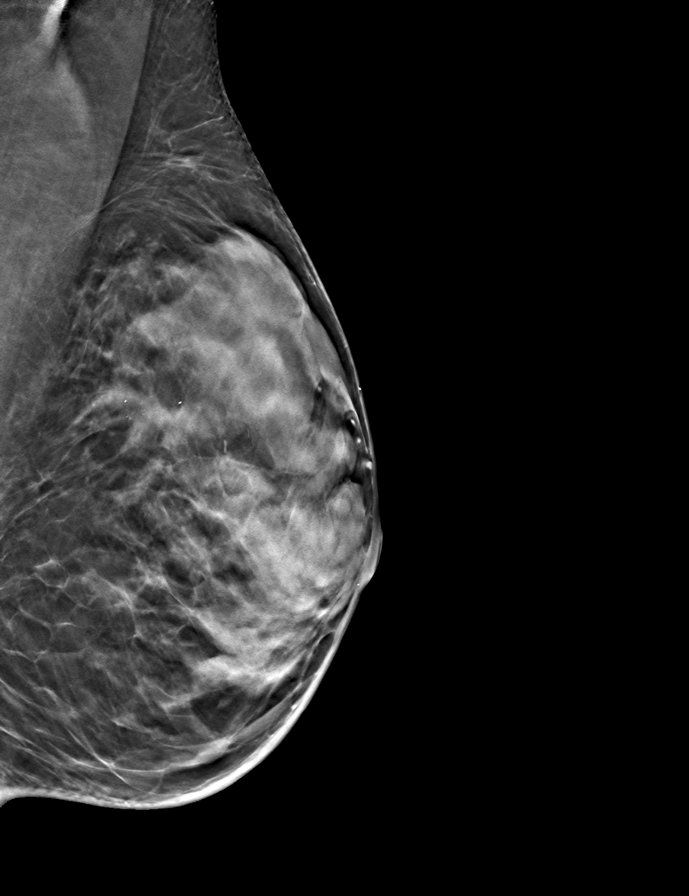

[R MLO tomo · tomo slice 31/61.0]
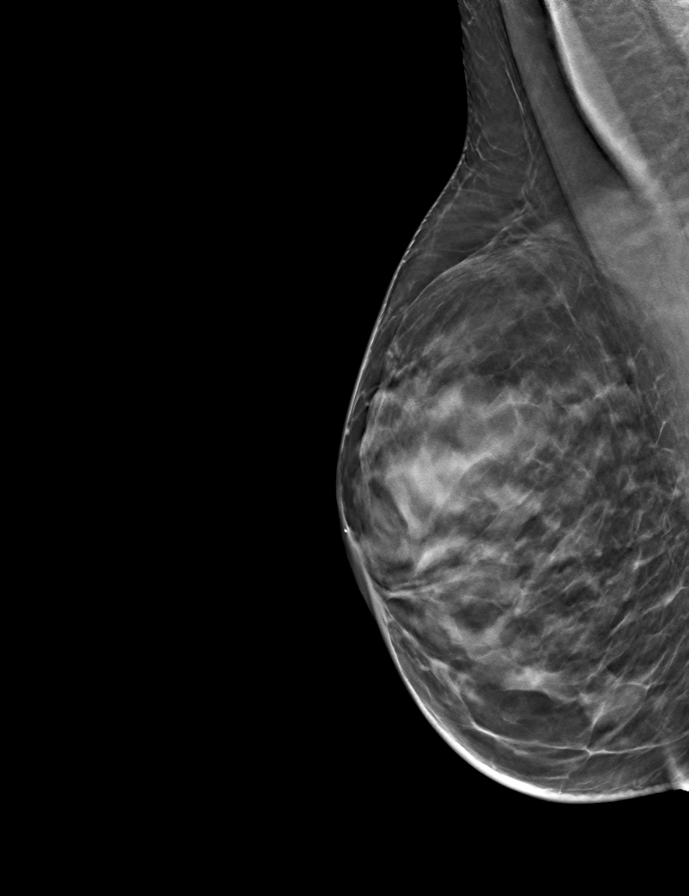

[R CC tomo · tomo slice 33/65.0]
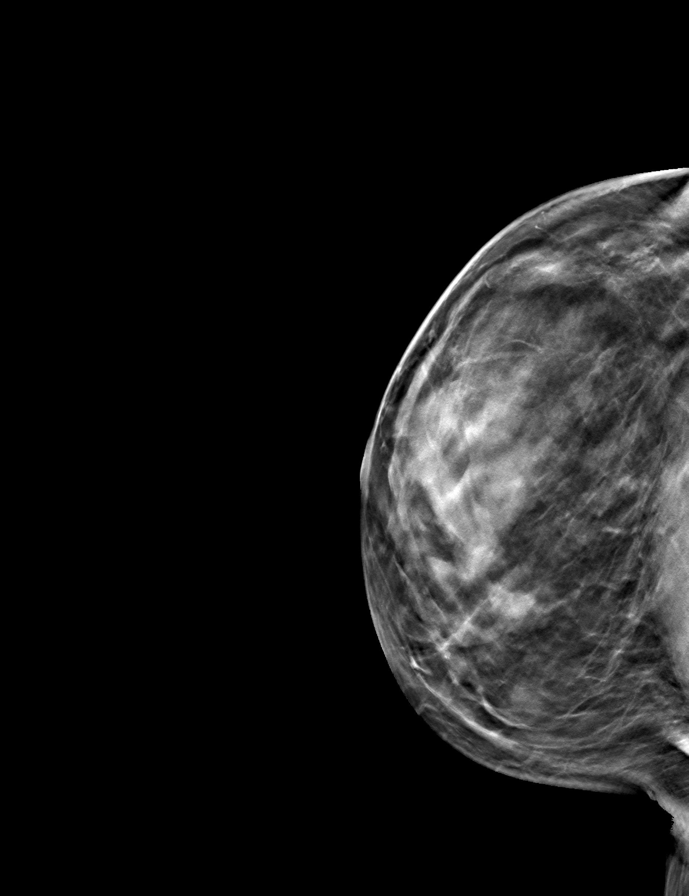

[L CC tomo · tomo slice 28/55.0]
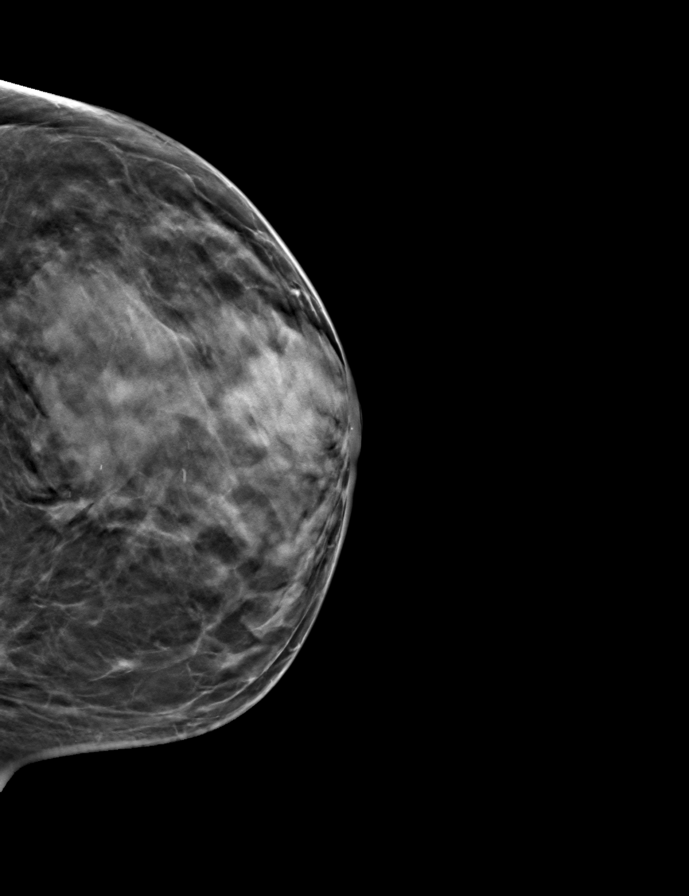

[9 of 24 positions shown; findings below may reference images not displayed]

ACR Breast Density Category c: The breast tissue is heterogeneously
dense, which may obscure small masses.
FINDINGS: In the left breast, possible asymmetries warrants further
evaluation. In the right breast, no findings suspicious for
malignancy.
IMPRESSION: Further evaluation is suggested for possible asymmetry in the left
breast.

RECOMMENDATION:
Diagnostic mammogram and possibly ultrasound of the left breast.
(Code:AX-0-SS1)

The patient will be contacted regarding the findings, and additional
imaging will be scheduled.

BI-RADS CATEGORY  0: Incomplete. Need additional imaging evaluation
and/or prior mammograms for comparison.

## 2023-09-25 ENCOUNTER — Encounter: Payer: Self-pay | Admitting: Family Medicine

## 2023-09-25 ENCOUNTER — Other Ambulatory Visit: Payer: Self-pay

## 2023-09-25 ENCOUNTER — Ambulatory Visit (HOSPITAL_COMMUNITY)
Admission: RE | Admit: 2023-09-25 | Discharge: 2023-09-25 | Disposition: A | Discharge status: Home / Self Care | Source: Ambulatory Visit | Attending: Family Medicine | Admitting: Family Medicine

## 2023-09-25 ENCOUNTER — Ambulatory Visit: Admitting: Family Medicine

## 2023-09-25 ENCOUNTER — Emergency Department (HOSPITAL_COMMUNITY)

## 2023-09-25 ENCOUNTER — Other Ambulatory Visit: Payer: Self-pay | Admitting: Family Medicine

## 2023-09-25 ENCOUNTER — Encounter (HOSPITAL_COMMUNITY): Payer: Self-pay

## 2023-09-25 ENCOUNTER — Emergency Department (HOSPITAL_COMMUNITY): Admission: EM | Admit: 2023-09-25 | Discharge: 2023-09-25 | Disposition: A | Discharge status: Home / Self Care

## 2023-09-25 VITALS — BP 129/85 | HR 110 | Temp 97.6°F | Ht 64.0 in | Wt 130.0 lb

## 2023-09-25 DIAGNOSIS — C3490 Malignant neoplasm of unspecified part of unspecified bronchus or lung: Secondary | ICD-10-CM | POA: Insufficient documentation

## 2023-09-25 DIAGNOSIS — G9389 Other specified disorders of brain: Secondary | ICD-10-CM

## 2023-09-25 DIAGNOSIS — Z91040 Latex allergy status: Secondary | ICD-10-CM | POA: Insufficient documentation

## 2023-09-25 DIAGNOSIS — J45909 Unspecified asthma, uncomplicated: Secondary | ICD-10-CM | POA: Diagnosis not present

## 2023-09-25 DIAGNOSIS — R590 Localized enlarged lymph nodes: Secondary | ICD-10-CM | POA: Diagnosis not present

## 2023-09-25 DIAGNOSIS — G939 Disorder of brain, unspecified: Secondary | ICD-10-CM | POA: Insufficient documentation

## 2023-09-25 DIAGNOSIS — R519 Headache, unspecified: Secondary | ICD-10-CM | POA: Diagnosis not present

## 2023-09-25 DIAGNOSIS — G4452 New daily persistent headache (NDPH): Secondary | ICD-10-CM | POA: Insufficient documentation

## 2023-09-25 DIAGNOSIS — G936 Cerebral edema: Secondary | ICD-10-CM | POA: Diagnosis not present

## 2023-09-25 DIAGNOSIS — R932 Abnormal findings on diagnostic imaging of liver and biliary tract: Secondary | ICD-10-CM | POA: Diagnosis not present

## 2023-09-25 DIAGNOSIS — R918 Other nonspecific abnormal finding of lung field: Secondary | ICD-10-CM | POA: Diagnosis not present

## 2023-09-25 LAB — CBC
HCT: 46.8 % — ABNORMAL HIGH (ref 36.0–46.0)
Hemoglobin: 15.9 g/dL — ABNORMAL HIGH (ref 12.0–15.0)
MCH: 31.9 pg (ref 26.0–34.0)
MCHC: 34 g/dL (ref 30.0–36.0)
MCV: 93.8 fL (ref 80.0–100.0)
Platelets: 259 10*3/uL (ref 150–400)
RBC: 4.99 MIL/uL (ref 3.87–5.11)
RDW: 12.3 % (ref 11.5–15.5)
WBC: 7.5 10*3/uL (ref 4.0–10.5)
nRBC: 0 % (ref 0.0–0.2)

## 2023-09-25 LAB — BASIC METABOLIC PANEL
Anion gap: 10 (ref 5–15)
BUN: 9 mg/dL (ref 6–20)
CO2: 23 mmol/L (ref 22–32)
Calcium: 10.1 mg/dL (ref 8.9–10.3)
Chloride: 103 mmol/L (ref 98–111)
Creatinine, Ser: 0.63 mg/dL (ref 0.44–1.00)
GFR, Estimated: >60 mL/min (ref >60)
Glucose, Bld: 115 mg/dL — ABNORMAL HIGH (ref 70–99)
Potassium: 4.1 mmol/L (ref 3.5–5.1)
Sodium: 136 mmol/L (ref 135–145)

## 2023-09-25 MED ORDER — OXYCODONE-ACETAMINOPHEN 5-325 MG PO TABS
1.0000 | ORAL_TABLET | Freq: Once | ORAL | Status: AC
  Administered 2023-09-25: 1 via ORAL
  Filled 2023-09-25: qty 1

## 2023-09-25 MED ORDER — OXYCODONE-ACETAMINOPHEN 5-325 MG PO TABS: 1.0000 | ORAL_TABLET | Freq: Four times a day (QID) | ORAL | 0 refills | Status: DC | PRN

## 2023-09-25 MED ORDER — RIZATRIPTAN BENZOATE 10 MG PO TBDP: 10.0000 mg | ORAL_TABLET | ORAL | 0 refills | Status: DC | PRN

## 2023-09-25 MED ORDER — PREDNISONE 10 MG PO TABS: ORAL_TABLET | ORAL | 0 refills | Status: DC

## 2023-09-25 MED ORDER — GADOBUTROL 1 MMOL/ML IV SOLN
6.8000 mL | Freq: Once | INTRAVENOUS | Status: AC | PRN
  Administered 2023-09-25: 6.8 mL via INTRAVENOUS

## 2023-09-25 MED ORDER — IOHEXOL 300 MG/ML  SOLN
100.0000 mL | Freq: Once | INTRAMUSCULAR | Status: AC | PRN
  Administered 2023-09-25: 100 mL via INTRAVENOUS

## 2023-09-25 NOTE — ED Notes (Signed)
 Pt up to bedside toilet- pt given few ice chips per request. Pt has friend/family at bedside.

## 2023-09-25 NOTE — ED Notes (Signed)
 Patient transported to CT

## 2023-09-25 NOTE — ED Notes (Signed)
 Pt given sprite per Dr Rhae Hammock ok,  Dr Rhae Hammock has reviewed scans with pt

## 2023-09-25 NOTE — ED Provider Notes (Signed)
 [place] EMERGENCY DEPARTMENT AT [facility] Provider Note   CSN: [id number] Arrival date & time: 09/25/23  1839     History  Chief Complaint  Patient presents with   hemorrage    Julie Horn is a 48 y.o. female.  48 year old female with no reported past medical history other than asthma presenting to the emergency department today with headaches and some gait instability.  She had an MRI performed by her PCP.  She had this done today and was told to come to the ER for further evaluation.  There was concerns for a mass lesion.  The patient denies any focal weakness, numbness, or tingling.  Reports mild headache currently.  Denies any other symptoms.        Home Medications Prior to Admission medications   Medication Sig Start Date End Date Taking? Authorizing Provider  acetaminophen (TYLENOL) 500 MG tablet Take 1,500 mg by mouth every 6 (six) hours as needed for mild pain (pain score 1-3).   Yes [provider]  albuterol (VENTOLIN HFA) 108 (90 Base) MCG/ACT inhaler Inhale 2 puffs into the lungs every 4 (four) hours as needed for wheezing or shortness of breath. 05/22/23  Yes Mechele Claude, MD  ibuprofen (ADVIL) 100 MG tablet Take 200-400 mg by mouth every 6 (six) hours as needed for pain.   Yes [provider]  LORazepam (ATIVAN) 1 MG tablet Take 0.5 mg by mouth every 8 (eight) hours as needed for anxiety.   Yes [provider]  methocarbamol (ROBAXIN) 500 MG tablet Take 1 tablet (500 mg total) by mouth 3 (three) times daily. Patient taking differently: Take 250 mg by mouth every 8 (eight) hours as needed for muscle spasms. 05/18/23  Yes Vickki Hearing, MD  montelukast (SINGULAIR) 10 MG tablet Take 1 tablet (10 mg total) by mouth at bedtime. Patient taking differently: Take 10 mg by mouth daily as needed (allergies). 05/22/23  Yes Mechele Claude, MD  oxyCODONE-acetaminophen (PERCOCET/ROXICET) 5-325 MG tablet Take 1 tablet by  mouth every 6 (six) hours as needed for severe pain (pain score 7-10). 09/25/23  Yes Durwin Glaze, MD  predniSONE (DELTASONE) 10 MG tablet Take 5 daily for 2 days followed by 4,3,2 and 1 for 2 days each. 09/25/23  Yes Mechele Claude, MD  traZODone (DESYREL) 150 MG tablet Use from 1/3 to 1 tablet nightly as needed for sleep. Patient taking differently: Take 75 mg by mouth at bedtime as needed for sleep. Use from 1/3 to 1 tablet nightly as needed for sleep. 05/22/23  Yes Mechele Claude, MD  rizatriptan (MAXALT-MLT) 10 MG disintegrating tablet Take 1 tablet (10 mg total) by mouth as needed for migraine. May repeat in 2 hours if needed Patient not taking: Reported on 09/25/2023 09/25/23   Mechele Claude, MD      Allergies    Levofloxacin, Sulfa antibiotics, Azithromycin, Doxycycline, and Latex    Review of Systems   Review of Systems  Neurological:  Positive for headaches.  All other systems reviewed and are negative.   Physical Exam Updated Vital Signs BP (!) 127/90   Pulse 100   Temp 98.3 F (36.8 C)   Resp 17   Ht 5\' 4"  (1.626 m)   Wt 58.9 kg   SpO2 93%   BMI 22.29 kg/m  Physical Exam Vitals and nursing note reviewed.   Gen: NAD Eyes: PERRL, EOMI HEENT: no oropharyngeal swelling Neck: trachea midline Resp: clear to auscultation bilaterally Card: RRR, no murmurs, rubs,  or gallops Abd: nontender, nondistended Extremities: no calf tenderness, no edema Vascular: 2+ radial pulses bilaterally, 2+ DP pulses bilaterally Neuro: Cranial nerves intact, equal strength sensation throughout bilateral upper and lower extremities with no dysmetria on finger-nose testing Skin: no rashes Psyc: acting appropriately   ED Results / Procedures / Treatments   Labs (all labs ordered are listed, but only abnormal results are displayed) Labs Reviewed  CBC - Abnormal; Notable for the following components:      Result Value   Hemoglobin 15.9 (*)    HCT 46.8 (*)    All other components within normal  limits  BASIC METABOLIC PANEL - Abnormal; Notable for the following components:   Glucose, Bld 115 (*)    All other components within normal limits    EKG None  Radiology CT CHEST ABDOMEN PELVIS W CONTRAST Result Date: 09/25/2023 CLINICAL DATA:  Kidney cancer staging, patient referred to ED by outpatient MRI center, vision problems, dizziness, brain mass * Tracking Code: BO * EXAM: CT CHEST, ABDOMEN, AND PELVIS WITH CONTRAST TECHNIQUE: Multidetector CT imaging of the chest, abdomen and pelvis was performed following the standard protocol during bolus administration of intravenous contrast. RADIATION DOSE REDUCTION: This exam was performed according to the departmental dose-optimization program which includes automated exposure control, adjustment of the mA and/or kV according to patient size and/or use of iterative reconstruction technique. CONTRAST:  OMNIPAQUE IOHEXOL 300 MG/ML  SOLN COMPARISON:  None Available. FINDINGS: CT CHEST FINDINGS Cardiovascular: No significant vascular findings. Normal heart size. No pericardial effusion. Mediastinum/Nodes: Enlarged right hilar lymph node measuring 1.5 x 1.2 cm. No other enlarged mediastinal, hilar, or axillary lymph nodes. Thyroid gland, trachea, and esophagus demonstrate no significant findings. Lungs/Pleura: Moderate centrilobular emphysema. Diffuse bilateral bronchial wall thickening and background of fine centrilobular nodularity throughout the lungs. Spiculated mass in the posterior right pulmonary apex measuring 2.6 x 1.4 cm (series 3, image 42). No pleural effusion or pneumothorax. Musculoskeletal: No chest wall abnormality. No acute osseous findings. CT ABDOMEN PELVIS FINDINGS Hepatobiliary: Tiny hypodensities in the central liver, incompletely characterized (series 2, image 60, 70). No gallstones, gallbladder wall thickening, or biliary dilatation. Pancreas: Unremarkable. No pancreatic ductal dilatation or surrounding inflammatory changes.  Spleen: Normal in size without significant abnormality. Adrenals/Urinary Tract: Adrenal glands are unremarkable. Kidneys are normal, without renal calculi, solid lesion, or hydronephrosis. Bladder is unremarkable. Stomach/Bowel: Stomach is within normal limits. Appendicolith adjacent to the appendiceal ostium. Appendix appears normal. No evidence of bowel wall thickening, distention, or inflammatory changes. Vascular/Lymphatic: No significant vascular findings are present. No enlarged abdominal or pelvic lymph nodes. Reproductive: No mass or other abnormality. Other: No abdominal wall hernia or abnormality. Trace free fluid in the low pelvis. Musculoskeletal: No acute osseous findings. Chronic bilateral pars defects of L5. Soft tissue nodule within the right gluteal subcutaneous fat measuring 1.1 x 0.7 cm (series 2, image 75). IMPRESSION: 1. Spiculated mass in the posterior right pulmonary apex measuring 2.6 x 1.4 cm, consistent with primary bronchogenic malignancy. 2. Enlarged right hilar lymph node measuring 1.5 x 1.2 cm, consistent with nodal metastatic disease. 3. Tiny hypodensities in the central liver, incompletely characterized, although statistically likely benign cysts or hemangiomas. Small metastases not strictly excluded. 4. Soft tissue nodule within the right gluteal subcutaneous fat; given location most likely an injection granuloma, however subcutaneous soft tissue metastases are occasionally seen in metastatic lung malignancy. Attention on follow-up. 5. Emphysema, diffuse bilateral bronchial wall thickening, and smoking-related respiratory bronchiolitis. 6. Trace free fluid in the low  pelvis, nonspecific and possibly physiologic. Emphysema (ICD10-J43.9). Electronically Signed   By: Jearld Lesch M.D.   On: 09/25/2023 21:22   MR BRAIN W WO CONTRAST Result Date: 09/25/2023 CLINICAL DATA:  Headache, increasing frequency or severity EXAM: MRI HEAD WITHOUT AND WITH CONTRAST TECHNIQUE: Multiplanar,  multiecho pulse sequences of the brain and surrounding structures were obtained without and with intravenous contrast. CONTRAST:  6.18mL GADAVIST GADOBUTROL 1 MMOL/ML IV SOLN COMPARISON:  None Available. FINDINGS: Brain: There is a heterogeneously contrast-enhancing mass of the right cerebellar hemisphere that measures 2.4 x 1.6 x 1.9 cm. There is a large amount of surrounding vasogenic edema within the right cerebellar hemisphere that causes midline shift within the posterior fossa and narrowing of the fourth ventricle. There is no hydrocephalus. The supratentorial brain is normal. Vascular: Normal flow voids. Skull and upper cervical spine: Normal marrow signal. Sinuses/Orbits: Negative. Other: None. IMPRESSION: 1. Heterogeneously contrast-enhancing mass of the right cerebellar hemisphere with a large amount of surrounding vasogenic edema. Metastasis and hemangioblastoma are the most common infratentorial tumors in adults. Astrocytoma is another possibility. 2. Mass effect on the fourth ventricle without hydrocephalus. Patient transferred to the emergency department by the MRI technologists at the time of scan acquisition. Electronically Signed   By: Deatra Robinson M.D.   On: 09/25/2023 19:02    Procedures Procedures    Medications Ordered in ED Medications  oxyCODONE-acetaminophen (PERCOCET/ROXICET) 5-325 MG per tablet 1 tablet (has no administration in time range)  iohexol (OMNIPAQUE) 300 MG/ML solution 100 mL (100 mLs Intravenous Contrast Given 09/25/23 2052)    ED Course/ Medical Decision Making/ A&P                                 Medical Decision Making 48 year old female with past medical history of asthma presents the emergency department today with new mass lesion on MRI.  I will discuss her case with neurosurgery.  Will obtain basic labs.  She is neuro intact at this time.  Had a call discussed case with neurosurgery.  Dr. Dutch Quint reviewed the patient's images.  He recommended outpatient  follow-up but did recommend getting a CT scan of her chest, abdomen, and pelvis so she can be referred to oncology.  He felt the area in question was relatively small so recommended against any steroids or admission at this time.  He is concerned for metastatic disease.  The CT scan did show likely primary lung malignancy.  Discussed the patient's case with Dr. Anders Simmonds who will help get the patient a follow-up appointment within the next week.  She is ultimately discharged with return precautions.  Amount and/or Complexity of Data Reviewed Labs: ordered. Radiology: ordered.  Risk Prescription drug management.           Final Clinical Impression(s) / ED Diagnoses Final diagnoses:  Malignant neoplasm of lung, unspecified laterality, unspecified part of lung (HCC)  Brain mass    Rx / DC Orders ED Discharge Orders          Ordered    Ambulatory referral to Hematology / Oncology       Comments: Your emergency department provider has referred you to see a hematology/oncology specialist. These are physicians who specialize in blood disorders and cancers, or findings concerning for cancer. You will receive a phone call from the [facility] Office to set up your appointment within 2 business days: [employer] operate Mon - Fri, 8:00 a.m. to 5:00 p.m.;  closed for federally recognized holidays. Please be sure your phone is not set to block numbers during this time.   09/25/23 2150    oxyCODONE-acetaminophen (PERCOCET/ROXICET) 5-325 MG tablet  Every 6 hours PRN        09/25/23 2200    Ambulatory referral to Hematology / Oncology       Comments: Your emergency department provider has referred you to see a hematology/oncology specialist. These are physicians who specialize in blood disorders and cancers, or findings concerning for cancer. You will receive a phone call from the [facility] Office to set up your appointment within 2 business days: [employer] operate Mon - Fri, 8:00  a.m. to 5:00 p.m.; closed for federally recognized holidays. Please be sure your phone is not set to block numbers during this time.   09/25/23 2201              Durwin Glaze, MD 09/25/23 2202

## 2023-09-25 NOTE — Progress Notes (Signed)
 Subjective:  Patient ID: Julie Horn, female    DOB: [birth year]  Age: 48 y.o. MRN: [id number]  CC: Headache (3 weeks straight of headaches. UC gave prednisone toradol and abx. Went away then came back. Dizzy when headache is bad. Head throbs when she first sits down or bends over.  Localized for now to top/ back of head. When liked ears checked to see if that could be a issue. No sensitivity to light or sound. )   HPI Adisyn Yetta Marceaux presents for Steroid plus Ktorolac gave 1 day relief last week. Persistent, intermittent for 3 weeks. Triggered by standing and moving around. Throbs. Occipital. 8/10. Needs to get eyes checked.      09/25/2023    1:09 PM 07/17/2023    1:23 PM 05/22/2023   10:09 AM  Depression screen PHQ 2/9  Decreased Interest 0 0 1  Down, Depressed, Hopeless 1 1 0  PHQ - 2 Score 1 1 1   Altered sleeping 0 0 3  Tired, decreased energy 2 1 2   Change in appetite 0 1 0  Feeling bad or failure about yourself  0 0 0  Trouble concentrating 2 1 1   Moving slowly or fidgety/restless 0 0 0  Suicidal thoughts 0 0 0  PHQ-9 Score 5 4 7   Difficult doing work/chores Not difficult at all Somewhat difficult Somewhat difficult    History Sanvika has a past medical history of Abnormal Pap smear of cervix (04/17/2018), Asthma, GERD (gastroesophageal reflux disease), History of migraine (04/17/2018), Raynaud's disease, and Spondylolisthesis of lumbar region (04/17/2018).   She has a past surgical history that includes Tonsillectomy and adenoidectomy (1983) and laparoscopy.   Her family history includes Arthritis in her brother; Asthma in her brother and mother; Breast cancer in her mother; Cancer in her maternal grandmother; Ehlers-Danlos syndrome in her mother; Heart attack in her maternal grandfather; Polymyalgia rheumatica in her mother.She reports that she has been smoking cigarettes. She has a 20 pack-year smoking history. She has never used smokeless tobacco. She reports  current alcohol use. She reports that she does not currently use drugs.    ROS Review of Systems  Constitutional: Negative.   HENT:  Positive for ear pain (pressure).   Eyes:  Positive for visual disturbance. Negative for photophobia and pain.  Respiratory:  Negative for shortness of breath.   Cardiovascular:  Negative for chest pain.  Gastrointestinal:  Positive for nausea. Negative for abdominal pain and vomiting.  Musculoskeletal:  Negative for arthralgias.  Neurological:  Positive for light-headedness.    Objective:  BP 129/85   Pulse (!) 110   Temp 97.6 F (36.4 C)   Ht 5\' 4"  (1.626 m)   Wt 130 lb (59 kg)   SpO2 98%   BMI 22.31 kg/m   BP Readings from Last 3 Encounters:  09/25/23 129/85  07/17/23 134/73  05/22/23 122/77    Wt Readings from Last 3 Encounters:  09/25/23 130 lb (59 kg)  07/17/23 131 lb 3.2 oz (59.5 kg)  05/22/23 132 lb (59.9 kg)     Physical Exam Constitutional:      General: She is not in acute distress.    Appearance: She is well-developed.  HENT:     Head: Normocephalic and atraumatic.  Eyes:     Conjunctiva/sclera: Conjunctivae normal.     Pupils: Pupils are equal, round, and reactive to light.  Neck:     Thyroid: No thyromegaly.  Cardiovascular:     Rate and Rhythm: Normal rate  and regular rhythm.     Heart sounds: Normal heart sounds. No murmur heard. Pulmonary:     Effort: Pulmonary effort is normal. No respiratory distress.     Breath sounds: Normal breath sounds. No wheezing or rales.  Abdominal:     General: Bowel sounds are normal. There is no distension.     Palpations: Abdomen is soft.     Tenderness: There is no abdominal tenderness.  Musculoskeletal:        General: Normal range of motion.     Cervical back: Normal range of motion and neck supple.  Lymphadenopathy:     Cervical: No cervical adenopathy.  Skin:    General: Skin is warm and dry.  Neurological:     Mental Status: She is alert and oriented to person,  place, and time.  Psychiatric:        Behavior: Behavior normal.        Thought Content: Thought content normal.        Judgment: Judgment normal.       Assessment & Plan:   Jeriah was seen today for headache.  Diagnoses and all orders for this visit:  New daily persistent headache -     MR Brain Wo Contrast; Future  Other orders -     rizatriptan (MAXALT-MLT) 10 MG disintegrating tablet; Take 1 tablet (10 mg total) by mouth as needed for migraine. May repeat in 2 hours if needed -     predniSONE (DELTASONE) 10 MG tablet; Take 5 daily for 2 days followed by 4,3,2 and 1 for 2 days each.       I am having Judithann Y. Calligan start on rizatriptan and predniSONE. I am also having her maintain her multivitamin, fluticasone, methocarbamol, albuterol, montelukast, traZODone, and fluticasone furoate-vilanterol.  Allergies as of 09/25/2023       Reactions   Latex    Levofloxacin    Sulfa Antibiotics    Azithromycin Rash   Doxycycline Rash        Medication List        Accurate as of September 25, 2023  1:46 PM. If you have any questions, ask your nurse or doctor.          albuterol 108 (90 Base) MCG/ACT inhaler Commonly known as: VENTOLIN HFA Inhale 2 puffs into the lungs every 4 (four) hours as needed for wheezing or shortness of breath.   fluticasone 50 MCG/ACT nasal spray Commonly known as: FLONASE Place 2 sprays into both nostrils daily.   fluticasone furoate-vilanterol 100-25 MCG/ACT Aepb Commonly known as: Breo Ellipta Inhale 1 puff into the lungs daily.   methocarbamol 500 MG tablet Commonly known as: ROBAXIN Take 1 tablet (500 mg total) by mouth 3 (three) times daily.   montelukast 10 MG tablet Commonly known as: SINGULAIR Take 1 tablet (10 mg total) by mouth at bedtime.   multivitamin tablet Take 1 tablet by mouth daily.   predniSONE 10 MG tablet Commonly known as: DELTASONE Take 5 daily for 2 days followed by 4,3,2 and 1 for 2 days each. Started by:  Eidan Muellner   rizatriptan 10 MG disintegrating tablet Commonly known as: Maxalt-MLT Take 1 tablet (10 mg total) by mouth as needed for migraine. May repeat in 2 hours if needed Started by: Mylin Gignac   traZODone 150 MG tablet Commonly known as: DESYREL Use from 1/3 to 1 tablet nightly as needed for sleep.         Follow-up: Return in about 6 weeks (  around 11/06/2023), or if symptoms worsen or fail to improve.  Mechele Claude, M.D.

## 2023-09-25 NOTE — Telephone Encounter (Signed)
 Medications sent to pharmacy today. Spoke to pharmacy staff who confirms pt has already picked up these medications. No further action needed.  Routing to provider for review/authorization.

## 2023-09-25 NOTE — ED Triage Notes (Signed)
 Pt arrived via POV from outpatient MRI study. MRI called to report they were sending the Pt for further evaluation. Pt endorses vision problems, dizziness.

## 2023-09-25 NOTE — Discharge Instructions (Addendum)
 As discussed, please call and schedule a follow-up appointment with the neurosurgeon (Dr. Jordan Likes) at the number provided.  I did discuss your case with our oncologist on-call.  Their office should give you a call in the next few days to help you schedule a follow-up appointment in short order.  Please return to the ER or better yet go to [facility], ER if you have worsening symptoms.  Take the Percocet as needed for headache or pain.  Do not drive or drink alcohol taking this as may make you drowsy.

## 2023-09-25 NOTE — Telephone Encounter (Signed)
 Tell pt.: If the treatments we discussed and the MRI do not explain and treat the problem, we should consider this at a follow up visit.

## 2023-09-25 NOTE — Telephone Encounter (Signed)
 Copied from CRM [id number]. Topic: Clinical - Medication Refill >> Sep 25, 2023  2:18 PM Turkey B wrote: Most Recent Primary Care Visit:  Provider: Mechele Claude  Department: [facility]  Visit Type: ACUTE  Date: 09/25/2023  Medication: predniSONE (DELTASONE) 10 MG tablet /rizatriptan (MAXALT-MLT) 10 MG   Has the patient contacted their pharmacy? yes (Agent: If yes, when and what did the pharmacy advise?)contacdt pcp, says meds received at 1:39 today but pharmacy says they dont have it  Is this the correct pharmacy for this prescription? yes If no, delete pharmacy and type the correct one.   [id number] - [place], [place] - [address] [place] [address] Phone: [phone] Fax: [phone]   Has the prescription been filled recently? no  Is the patient out of the medication? yes  Has the patient been seen for an appointment in the last year OR does the patient have an upcoming appointment? yes  Can we respond through MyChart? yes  Agent: Please be advised that Rx refills may take up to 3 business days. We ask that you follow-up with your pharmacy.

## 2023-09-27 ENCOUNTER — Inpatient Hospital Stay: Attending: Hematology | Admitting: Oncology

## 2023-09-27 ENCOUNTER — Encounter: Payer: Self-pay | Admitting: Family Medicine

## 2023-09-27 ENCOUNTER — Inpatient Hospital Stay: Admitting: Licensed Clinical Social Worker

## 2023-09-27 VITALS — BP 136/75 | HR 78 | Temp 97.5°F | Resp 18 | Ht 64.0 in | Wt 128.0 lb

## 2023-09-27 DIAGNOSIS — Z809 Family history of malignant neoplasm, unspecified: Secondary | ICD-10-CM | POA: Diagnosis not present

## 2023-09-27 DIAGNOSIS — R918 Other nonspecific abnormal finding of lung field: Secondary | ICD-10-CM

## 2023-09-27 DIAGNOSIS — Z79899 Other long term (current) drug therapy: Secondary | ICD-10-CM | POA: Insufficient documentation

## 2023-09-27 DIAGNOSIS — C7801 Secondary malignant neoplasm of right lung: Secondary | ICD-10-CM

## 2023-09-27 DIAGNOSIS — G939 Disorder of brain, unspecified: Secondary | ICD-10-CM | POA: Diagnosis not present

## 2023-09-27 DIAGNOSIS — Z803 Family history of malignant neoplasm of breast: Secondary | ICD-10-CM

## 2023-09-27 DIAGNOSIS — Z87891 Personal history of nicotine dependence: Secondary | ICD-10-CM | POA: Diagnosis not present

## 2023-09-27 DIAGNOSIS — C3490 Malignant neoplasm of unspecified part of unspecified bronchus or lung: Secondary | ICD-10-CM

## 2023-09-27 DIAGNOSIS — C7931 Secondary malignant neoplasm of brain: Secondary | ICD-10-CM | POA: Insufficient documentation

## 2023-09-27 NOTE — Assessment & Plan Note (Signed)
 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  MRI showed right cerebellar lesion with some vasogenic edema.Persistent severe occipital headaches with pressure sensation. Trazodone aids sleep, ibuprofen and acetaminophen for pain, occasional Percocet for severe pain. -Will refer to radiation oncology for probable SRS/RT - Continue ibuprofen and acetaminophen for pain management. - Use Percocet as needed for severe pain. - Monitor headache symptoms and adjust treatment as necessary.

## 2023-09-27 NOTE — Patient Instructions (Addendum)
 [facility]  Discharge Instructions  You were seen and examined today by Dr. Anders Simmonds. Dr. Anders Simmonds is a medical oncologist, meaning that he specializes in the treatment of cancer diagnoses. Dr. Anders Simmonds discussed your past medical history, family history of cancers, and the events that led to you being here today.  You were referred to Dr. Anders Simmonds due to an abnormal CT scan and MRI brain. The CT scan revealed a mass in your lung most consistent with lung cancer and the MRI revealed a mass on your brain. This is most highly concerning for cancer that started in your lung and spread to your brain, most likely making it Stage IV. To confirm, Dr. Anders Simmonds has recommended a biopsy through a bronchoscopy which will be done by pulmonology.  Due to the mass on your brain, Dr. Anders Simmonds we will refer you to Radiation Oncology in [place] to decrease the symptoms you are having related to the mass.  Dr. Anders Simmonds has also recommended a PET scan. A PET scan is a specialized CT scan which illuminates cancer present in the body.  Follow-up as scheduled.  Thank you for choosing [facility] to provide your oncology and hematology care.   To afford each patient quality time with our provider, please arrive at least 15 minutes before your scheduled appointment time. You may need to reschedule your appointment if you arrive late (10 or more minutes). Arriving late affects you and other patients whose appointments are after yours.  Also, if you miss three or more appointments without notifying the office, you may be dismissed from the clinic at the provider's discretion.    Again, thank you for choosing [facility].  Our hope is that these requests will decrease the amount of time that you wait before being seen by our physicians.   If you have a lab appointment with the Cancer Center - please note that after April 8th, all labs will be drawn in the cancer  center.  You do not have to check in or register with the main entrance as you have in the past but will complete your check-in at the cancer center.            _____________________________________________________________  Should you have questions after your visit to [facility], please contact our office at [phone] and follow the prompts.  Our office hours are 8:00 a.m. to 4:30 p.m. Monday - Thursday and 8:00 a.m. to 2:30 p.m. Friday.  Please note that voicemails left after 4:00 p.m. may not be returned until the following business day.  We are closed weekends and all major holidays.  You do have access to a nurse 24-7, just call the main number to the clinic [phone] and do not press any options, hold on the line and a nurse will answer the phone.    For prescription refill requests, have your pharmacy contact our office and allow 72 hours.    Masks are no longer required in the cancer centers. If you would like for your care team to wear a mask while they are taking care of you, please let them know. You may have one support person who is at least 47 years old accompany you for your appointments.

## 2023-09-27 NOTE — Assessment & Plan Note (Signed)
 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 Discussed potential for radiation therapy, targeted therapies, and chemotherapy. - Refer to pulmonologist for lung biopsy to confirm the diagnosis - Coordinate with radiation oncology for brain radiation therapy. - Order PET scan to confirm findings and establish baseline. - Provide psychosocial support through Child psychotherapist. - Refer to nutritionist for dietary guidance. -Will send for molecular testing once we have a confirmed diagnosis.  Return to clinic after biopsy to discuss further steps of management

## 2023-09-27 NOTE — Progress Notes (Signed)
 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  Oncology History: I have reviewed her chart and materials related to her cancer extensively and collaborated history with the patient. Summary of oncologic history is as follows:  Oncology History  Metastatic lung carcinoma, right (HCC)  09/25/2023 Imaging   CT CAP w contrast:  1. Spiculated mass in the posterior right pulmonary apex measuring 2.6 x 1.4 cm, consistent with primary bronchogenic malignancy. 2. Enlarged right hilar lymph node measuring 1.5 x 1.2 cm, consistent with nodal metastatic disease. 3. Tiny hypodensities in the central liver, incompletely characterized, although statistically likely benign cysts or hemangiomas. Small metastases not strictly excluded. 4. Soft tissue nodule within the right gluteal subcutaneous fat;given location most likely an injection granuloma, however subcutaneous soft tissue metastases are occasionally seen in metastatic lung malignancy. Attention on follow-up.     09/25/2023 Imaging   MRI brain w wo contrast:  1. Heterogeneously contrast-enhancing mass of the right cerebellar hemisphere with a large amount of surrounding vasogenic edema. Metastasis and hemangioblastoma are the most common infratentorial tumors in adults. Astrocytoma is another possibility. 2. Mass effect on the fourth ventricle without hydrocephalus.   09/27/2023 Initial Diagnosis   Metastatic lung carcinoma, right (HCC)       History of Presenting Illness: Julie Horn 48 y.o. female presents with a likely diagnosis of lung cancer and a suspected metastatic brain mass. Patient is accompanied by her significant other today.She has been experiencing persistent headaches for the past three weeks, which have been severe enough to disrupt sleep. The headaches are located primarily in the back of the head and are accompanied by a sensation of pressure,  initially mistaken for sinus infections. She also reports dizziness, particularly when leaning or tilting, but has not experienced any falls. Her symptoms have been ongoing since at least January, and possibly as early as December. She denies any significant cough or weight loss, but reports a decrease in appetite associated with recent anxiety. She is currently taking trazodone for sleep and has been prescribed percocet for pain, but primarily uses ibuprofen.  She presented with the symptoms to her primary care who did an MRI of brain which revealed a mass in her right cerebellum.  She then was sent to the ER for further workup.  CT scan done in the ER showed a right lung spiculated mass suspicious for lung carcinoma. Apart from the above complaints, patient has been doing well.  She is completely independent and has some balance problems but did not cause her to fall.  Patient denies vision changes, loss of weight, loss of appetite, hemoptysis, abdominal pain. Patient was a former smoker, quit 2 years ago.  She does not drink alcohol.  She has an adopted son.  And lives in [place] with her significant other and child.  Medical History: Past Medical History:  Diagnosis Date   Abnormal Pap smear of cervix 04/17/2018   Asthma    GERD (gastroesophageal reflux disease)    History of migraine 04/17/2018   Had been managed on Topamax but had side effect. Now not active.   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 infertility  Other Topics Concern   Not on file  Social History Narrative   Not on file   Social Drivers of Health   Financial Resource Strain: Not on file  Food Insecurity: No Food Insecurity (09/27/2023)   Hunger Vital Sign    Worried About Running Out of Food in the Last Year: Never true    Ran Out of Food in the Last Year: Never true  Transportation Needs: No Transportation Needs (09/27/2023)   PRAPARE - Administrator, Civil Service (Medical): No    Lack of Transportation (Non-Medical): No  Physical Activity: Not on file  Stress: Not on file  Social Connections: Not on file  Intimate Partner Violence: Not At Risk (09/27/2023)   Humiliation, Afraid, Rape, and Kick questionnaire    Fear of Current or Ex-Partner: No    Emotionally Abused: No    Physically Abused: No    Sexually Abused: No    Family History: Family History  Problem Relation Age of Onset   Breast cancer Mother    Asthma Mother    Polymyalgia rheumatica Mother    Ehlers-Danlos syndrome Mother    Cancer Maternal Grandmother    Heart attack Maternal Grandfather    Asthma Brother    Arthritis Brother     Allergies:  is allergic to levofloxacin, sulfa antibiotics, azithromycin, doxycycline, and latex.  Medications:  Current Outpatient Medications  Medication Sig Dispense Refill   acetaminophen (TYLENOL) 500 MG tablet Take 1,500 mg by mouth every 6 (six) hours as needed for mild pain (pain score 1-3).     albuterol (VENTOLIN HFA) 108 (90 Base) MCG/ACT  inhaler Inhale 2 puffs into the lungs every 4 (four) hours as needed for wheezing or shortness of breath. 6.7 g 2   ibuprofen (ADVIL) 100 MG tablet Take 200-400 mg by mouth every 6 (six) hours as needed for pain.     methocarbamol (ROBAXIN) 500 MG tablet Take 1 tablet (500 mg total) by mouth 3 (three) times daily. (Patient taking differently: Take 250 mg by mouth every 8 (eight) hours as needed for muscle spasms.) 60 tablet 1   montelukast (SINGULAIR) 10 MG tablet Take 1 tablet (10 mg total) by mouth at bedtime. (Patient taking differently: Take 10 mg by mouth daily as needed (allergies).) 90 tablet 3   oxyCODONE-acetaminophen (PERCOCET/ROXICET) 5-325 MG tablet Take 1 tablet by mouth every 6 (six) hours as needed for severe pain (pain score 7-10). 15 tablet 0   predniSONE (DELTASONE) 10 MG tablet Take 5 daily for 2 days followed by 4,3,2 and 1 for 2 days each. 30 tablet 0   traZODone (DESYREL) 150 MG tablet Use from 1/3 to 1 tablet  nightly as needed for sleep. (Patient taking differently: Take 75 mg by mouth at bedtime as needed for sleep. Use from 1/3 to 1 tablet nightly as needed for sleep.) 30 tablet 5   No current facility-administered medications for this visit.    Review of Systems: Constitutional: Denies fevers, chills or abnormal night sweats Eyes: Denies blurriness of vision, double vision or watery eyes Ears, nose, mouth, throat, and face: Denies mucositis or sore throat Respiratory: Denies cough, dyspnea or wheezes Cardiovascular: Denies palpitation, chest discomfort or lower extremity swelling Gastrointestinal:  Denies nausea, heartburn or change in bowel habits Skin: Denies abnormal skin rashes Lymphatics: Denies new lymphadenopathy or easy bruising Neurological:Denies numbness, tingling or new weaknesses Behavioral/Psych: Mood is stable, no new changes  All other systems were reviewed with the patient and are negative.  Physical Examination: ECOG PERFORMANCE STATUS: 0 -  Asymptomatic  Vitals:   09/27/23 0815  BP: 136/75  Pulse: 78  Resp: 18  Temp: (!) 97.5 F (36.4 C)  SpO2: 100%   Filed Weights   09/27/23 0815  Weight: 128 lb (58.1 kg)    GENERAL:alert, no distress and comfortable SKIN: skin color, texture, turgor are normal, no rashes or significant lesions LUNGS: clear to auscultation and percussion with normal breathing effort HEART: regular rate & rhythm and no murmurs and no lower extremity edema ABDOMEN:abdomen soft, non-tender and normal bowel sounds Musculoskeletal:no cyanosis of digits and no clubbing  PSYCH: alert & oriented x 3 with fluent speech   Laboratory Data: I have reviewed the data as listed Lab Results  Component Value Date   WBC 7.5 09/25/2023   HGB 15.9 (H) 09/25/2023   HCT 46.8 (H) 09/25/2023   MCV 93.8 09/25/2023   PLT 259 09/25/2023   Recent Labs    05/22/23 1054 09/25/23 1927  NA 145* 136  K 4.5 4.1  CL 104 103  CO2 22 23  GLUCOSE 97 115*  BUN 7 9  CREATININE 0.71 0.63  CALCIUM 9.9 10.1  GFRNONAA  --  >60  PROT 7.4  --   ALBUMIN 4.8  --   AST 15  --   ALT 23  --   ALKPHOS 75  --   BILITOT 0.5  --     Radiographic Studies: I have personally reviewed the radiological images as listed and agreed with the findings in the report.  CT CHEST ABDOMEN PELVIS W CONTRAST Result Date: 09/25/2023 CLINICAL DATA:  Kidney cancer staging, patient referred to ED by outpatient MRI center, vision problems, dizziness, brain mass * Tracking Code: BO * EXAM: CT CHEST, ABDOMEN, AND PELVIS WITH CONTRAST TECHNIQUE: Multidetector CT imaging of the chest, abdomen and pelvis was performed following the standard protocol during bolus administration of intravenous contrast. RADIATION DOSE REDUCTION: This exam was performed according to the departmental dose-optimization program which includes automated exposure control, adjustment of the mA and/or kV according to patient size and/or use of iterative reconstruction technique.  CONTRAST:  OMNIPAQUE IOHEXOL 300 MG/ML  SOLN COMPARISON:  None Available. FINDINGS: CT CHEST FINDINGS Cardiovascular: No significant vascular findings. Normal heart size. No pericardial effusion. Mediastinum/Nodes: Enlarged right hilar lymph node measuring 1.5 x 1.2 cm. No other enlarged mediastinal, hilar, or axillary lymph nodes. Thyroid gland, trachea, and esophagus demonstrate no significant findings. Lungs/Pleura: Moderate centrilobular emphysema. Diffuse bilateral bronchial wall thickening and background of fine centrilobular nodularity throughout the lungs. Spiculated mass in the posterior right pulmonary apex measuring 2.6 x 1.4 cm (series 3, image 42). No pleural effusion  or pneumothorax. Musculoskeletal: No chest wall abnormality. No acute osseous findings. CT ABDOMEN PELVIS FINDINGS Hepatobiliary: Tiny hypodensities in the central liver, incompletely characterized (series 2, image 60, 70). No gallstones, gallbladder wall thickening, or biliary dilatation. Pancreas: Unremarkable. No pancreatic ductal dilatation or surrounding inflammatory changes. Spleen: Normal in size without significant abnormality. Adrenals/Urinary Tract: Adrenal glands are unremarkable. Kidneys are normal, without renal calculi, solid lesion, or hydronephrosis. Bladder is unremarkable. Stomach/Bowel: Stomach is within normal limits. Appendicolith adjacent to the appendiceal ostium. Appendix appears normal. No evidence of bowel wall thickening, distention, or inflammatory changes. Vascular/Lymphatic: No significant vascular findings are present. No enlarged abdominal or pelvic lymph nodes. Reproductive: No mass or other abnormality. Other: No abdominal wall hernia or abnormality. Trace free fluid in the low pelvis. Musculoskeletal: No acute osseous findings. Chronic bilateral pars defects of L5. Soft tissue nodule within the right gluteal subcutaneous fat measuring 1.1 x 0.7 cm (series 2, image 75). IMPRESSION: 1. Spiculated mass  in the posterior right pulmonary apex measuring 2.6 x 1.4 cm, consistent with primary bronchogenic malignancy. 2. Enlarged right hilar lymph node measuring 1.5 x 1.2 cm, consistent with nodal metastatic disease. 3. Tiny hypodensities in the central liver, incompletely characterized, although statistically likely benign cysts or hemangiomas. Small metastases not strictly excluded. 4. Soft tissue nodule within the right gluteal subcutaneous fat; given location most likely an injection granuloma, however subcutaneous soft tissue metastases are occasionally seen in metastatic lung malignancy. Attention on follow-up. 5. Emphysema, diffuse bilateral bronchial wall thickening, and smoking-related respiratory bronchiolitis. 6. Trace free fluid in the low pelvis, nonspecific and possibly physiologic. Emphysema (ICD10-J43.9). Electronically Signed   By: Jearld Lesch M.D.   On: 09/25/2023 21:22   MR BRAIN W WO CONTRAST Result Date: 09/25/2023 CLINICAL DATA:  Headache, increasing frequency or severity EXAM: MRI HEAD WITHOUT AND WITH CONTRAST TECHNIQUE: Multiplanar, multiecho pulse sequences of the brain and surrounding structures were obtained without and with intravenous contrast. CONTRAST:  6.63mL GADAVIST GADOBUTROL 1 MMOL/ML IV SOLN COMPARISON:  None Available. FINDINGS: Brain: There is a heterogeneously contrast-enhancing mass of the right cerebellar hemisphere that measures 2.4 x 1.6 x 1.9 cm. There is a large amount of surrounding vasogenic edema within the right cerebellar hemisphere that causes midline shift within the posterior fossa and narrowing of the fourth ventricle. There is no hydrocephalus. The supratentorial brain is normal. Vascular: Normal flow voids. Skull and upper cervical spine: Normal marrow signal. Sinuses/Orbits: Negative. Other: None. IMPRESSION: 1. Heterogeneously contrast-enhancing mass of the right cerebellar hemisphere with a large amount of surrounding vasogenic edema. Metastasis and  hemangioblastoma are the most common infratentorial tumors in adults. Astrocytoma is another possibility. 2. Mass effect on the fourth ventricle without hydrocephalus. Patient transferred to the emergency department by the MRI technologists at the time of scan acquisition. Electronically Signed   By: Deatra Robinson M.D.   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 Discussed potential for radiation therapy, targeted therapies, and chemotherapy. - Refer to pulmonologist for lung biopsy to confirm the diagnosis - Coordinate with radiation oncology for brain radiation therapy. - Order PET scan to confirm findings and establish baseline. - Provide psychosocial support through Child psychotherapist. - Refer to nutritionist for dietary guidance. -Will send for molecular testing  once we have a confirmed diagnosis.  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  MRI showed right cerebellar lesion with some vasogenic edema.Persistent severe occipital headaches with pressure sensation. Trazodone aids sleep, ibuprofen and acetaminophen for pain, occasional Percocet for severe pain. -Will refer to radiation oncology for probable SRS/RT - Continue ibuprofen and acetaminophen for pain management. - Use Percocet as needed for severe pain. - Monitor headache symptoms and adjust treatment as necessary.   Orders Placed This Encounter  Procedures   NM PET Image Initial (PI) Skull Base To Thigh    Standing Status:   Future    Expected Date:   10/05/2023    Expiration Date:   09/26/2024    If indicated for the ordered procedure, I authorize the  administration of a radiopharmaceutical per Radiology protocol:   Yes    Is the patient pregnant?:   No    Preferred imaging location?:   [facility]    Release to patient:   Immediate   Ambulatory referral to Pulmonology    Referral Priority:   Urgent    Referral Type:   Consultation    Referral Reason:   Specialty Services Required    Requested Specialty:   Pulmonary Disease    Number of Visits Requested:   1   Ambulatory Referral to [facility] Nutrition    Referral Priority:   Routine    Referral Type:   Consultation    Referral Reason:   Specialty Services Required    Number of Visits Requested:   1   Ambulatory referral to Social Work    Referral Priority:   Routine    Referral Type:   Consultation    Referral Reason:   Specialty Services Required    Number of Visits Requested:   1    The total time spent in the appointment was 60 minutes encounter with patients including review of chart and various tests results, discussions about plan of care and coordination of care plan   All questions were answered. The patient knows to call the clinic with any problems, questions or concerns. No barriers to learning was detected.  Cindie Crumbly, MD 3/5/20253:17 PM

## 2023-09-27 NOTE — Progress Notes (Signed)
 [facility] Clinical Social Work  Initial Assessment   Julie Horn is a 48 y.o. year old female accompanied by patient and boyfriend. Clinical Social Work was referred by medical provider for assessment of psychosocial needs.   SDOH (Social Determinants of Health) assessments performed: Yes SDOH Interventions    Flowsheet Row Office Visit from 07/17/2023 in [facility] Office Visit from 05/22/2023 in [facility] Office Visit from 01/04/2022 in [facility]  SDOH Interventions     Depression Interventions/Treatment  PHQ2-9 Score <4 Follow-up Not Indicated Counseling PHQ2-9 Score <4 Follow-up Not Indicated       SDOH Screenings   Food Insecurity: No Food Insecurity (09/27/2023)  Housing: Unknown (09/27/2023)  Transportation Needs: No Transportation Needs (09/27/2023)  Utilities: Not At Risk (09/27/2023)  Depression (PHQ2-9): Medium Risk (09/25/2023)  Tobacco Use: High Risk (09/25/2023)     Distress Screen completed: No     No data to display            Family/Social Information:  Housing Arrangement: patient lives alone, but had plans prior to diagnosis to live with her significant other Julie Horn) who will now be moving in to provide support.  Pt has a 45 year old son she adopted.  His biological father has passed away. Family members/support persons in your life? Pt has 2 brothers, 1 resides in [place] and will not be able to provide support.  The other brother is local, but will only be able to provide limited support.  Pt lost her mother to breast cancer approximately 1 1/2 years ago.  Support for pt is limited.  Transportation concerns: no  Employment: Working full time pt works as a Warden/ranger.  Pt is able to work from home and has sick/vacation time accrued.  Per pt she has never signed up for short or long term disability benefits.  Open enrollment for her company is in May.   CSW encouraged pt to sign up for both at that time.  Pt anticipates her employer will be flexible and is planning to continue to work through treatment.   Income source: Employment Financial concerns: No Type of concern: None Food access concerns: no Religious or spiritual practice: Not known Advanced directives: Not known Services Currently in place:  none  Coping/ Adjustment to diagnosis: Patient understands treatment plan and what happens next? Treatment plan has not yet been determined.  Pt will have a PET scan later this week.  Treatment to be determined following completion of staging work up.   Concerns about diagnosis and/or treatment: Losing my job and/or losing income, Overwhelmed by information, Afraid of cancer, How will I care for myself, and pt's priority is her son and ensuring that he has the support he will need. Patient reported stressors: Children, Anxiety/ nervousness, Adjusting to my illness, and Facing my mortality Hopes and/or priorities: Pt's priority is to start treatment w/ the hope of positive results. Patient enjoys time with family/ friends Current coping skills/ strengths: Capable of independent living , Motivation for treatment/growth , and Physical Health     SUMMARY: Current SDOH Barriers:  Limited social support  Clinical Social Work Clinical Goal(s):  Identify supportive resources for pt and her son.  Interventions: Discussed common feeling and emotions when being diagnosed with cancer, and the importance of support during treatment Informed patient of the support team roles and support services at [facility] Provided CSW contact information and encouraged patient to call  with any questions or concerns Referred patient to [facility].  Pt's mother was connected to [facility]je[facility] Niemann and pt's son attended their summer programs while her mother was in treatment.  Emailed pt resources for talking to children about cancer as well as supportive services for children.   Discussed disability at length w/ pt and will submit a referral to [facility] if appropriate after pt begins treatment.    Follow Up Plan: Patient will contact CSW with any support or resource needs Patient verbalizes understanding of plan: Yes    Rachel Moulds, LCSW Clinical Social Worker [facility]

## 2023-09-28 DIAGNOSIS — G939 Disorder of brain, unspecified: Secondary | ICD-10-CM | POA: Diagnosis not present

## 2023-09-28 DIAGNOSIS — G9389 Other specified disorders of brain: Secondary | ICD-10-CM | POA: Diagnosis not present

## 2023-09-28 DIAGNOSIS — J45909 Unspecified asthma, uncomplicated: Secondary | ICD-10-CM | POA: Diagnosis not present

## 2023-09-28 DIAGNOSIS — I7300 Raynaud's syndrome without gangrene: Secondary | ICD-10-CM | POA: Diagnosis not present

## 2023-09-28 DIAGNOSIS — K219 Gastro-esophageal reflux disease without esophagitis: Secondary | ICD-10-CM | POA: Diagnosis not present

## 2023-09-28 DIAGNOSIS — F32A Depression, unspecified: Secondary | ICD-10-CM | POA: Diagnosis not present

## 2023-09-28 DIAGNOSIS — G936 Cerebral edema: Secondary | ICD-10-CM | POA: Diagnosis not present

## 2023-09-28 DIAGNOSIS — Z3202 Encounter for pregnancy test, result negative: Secondary | ICD-10-CM | POA: Diagnosis not present

## 2023-09-28 DIAGNOSIS — F4310 Post-traumatic stress disorder, unspecified: Secondary | ICD-10-CM | POA: Diagnosis not present

## 2023-09-28 DIAGNOSIS — C3411 Malignant neoplasm of upper lobe, right bronchus or lung: Secondary | ICD-10-CM | POA: Diagnosis not present

## 2023-09-28 DIAGNOSIS — J439 Emphysema, unspecified: Secondary | ICD-10-CM | POA: Diagnosis not present

## 2023-09-28 DIAGNOSIS — F419 Anxiety disorder, unspecified: Secondary | ICD-10-CM | POA: Diagnosis not present

## 2023-09-28 DIAGNOSIS — Z3200 Encounter for pregnancy test, result unknown: Secondary | ICD-10-CM | POA: Diagnosis not present

## 2023-09-28 DIAGNOSIS — Z825 Family history of asthma and other chronic lower respiratory diseases: Secondary | ICD-10-CM | POA: Diagnosis not present

## 2023-09-28 DIAGNOSIS — R220 Localized swelling, mass and lump, head: Secondary | ICD-10-CM | POA: Diagnosis not present

## 2023-09-28 DIAGNOSIS — Z87891 Personal history of nicotine dependence: Secondary | ICD-10-CM | POA: Diagnosis not present

## 2023-10-02 ENCOUNTER — Inpatient Hospital Stay: Admitting: Dietician

## 2023-10-03 ENCOUNTER — Encounter: Payer: Self-pay | Admitting: Pulmonary Disease

## 2023-10-03 ENCOUNTER — Ambulatory Visit: Admitting: Pulmonary Disease

## 2023-10-03 VITALS — BP 110/62 | HR 87 | Temp 97.1°F | Ht 64.0 in | Wt 133.8 lb

## 2023-10-03 DIAGNOSIS — Z7952 Long term (current) use of systemic steroids: Secondary | ICD-10-CM | POA: Diagnosis not present

## 2023-10-03 DIAGNOSIS — D496 Neoplasm of unspecified behavior of brain: Secondary | ICD-10-CM | POA: Diagnosis not present

## 2023-10-03 DIAGNOSIS — R918 Other nonspecific abnormal finding of lung field: Secondary | ICD-10-CM | POA: Diagnosis not present

## 2023-10-03 DIAGNOSIS — Z8709 Personal history of other diseases of the respiratory system: Secondary | ICD-10-CM

## 2023-10-03 DIAGNOSIS — J432 Centrilobular emphysema: Secondary | ICD-10-CM | POA: Diagnosis not present

## 2023-10-03 DIAGNOSIS — Z87891 Personal history of nicotine dependence: Secondary | ICD-10-CM

## 2023-10-03 DIAGNOSIS — C7931 Secondary malignant neoplasm of brain: Secondary | ICD-10-CM

## 2023-10-03 NOTE — Patient Instructions (Signed)
 VISIT SUMMARY:  Today, we discussed the findings of your recent scan, which revealed a lung mass consistent with carcinoma and an MRI which revealed a brain tumor. We reviewed your history of asthma and smoking, and we talked about your treatment goals.  YOUR PLAN:  -LUNG MASS WITH SUSPECTED METASTASIS: You have a lung mass that is likely a type of lung cancer called adenocarcinoma, which may have spread to your brain.  [facility] neurosurgery will proceed with a craniotomy on March 19th to remove the brain tumor and get a biopsy. We will also monitor the results of your PET scan scheduled for March 13th. Please schedule a follow-up appointment in four weeks to review the results and assess your progress.  -ASTHMA: Your asthma, which you have had since you were 20, is currently well-managed with minimal symptoms, likely due to your steroid treatment. We will continue to monitor your asthma symptoms and adjust your treatment as necessary.  -GOALS OF CARE: You are committed to aggressive treatment to be present for your 51 year old son, especially after your mother's death from aggressive breast cancer. We will support your treatment goals and provide the necessary care to help you achieve them.  INSTRUCTIONS:  Please proceed with the craniotomy on March 19th at [facility] to remove the brain tumor and obtain a biopsy. Monitor for PET scan results scheduled for March 13th. Schedule a follow-up appointment in four weeks to review the results and assess your progress.

## 2023-10-03 NOTE — Progress Notes (Signed)
 Subjective:    Patient ID: Julie Horn, female    DOB: May 11, 1976, 48 y.o.   MRN: [id number]  Patient Care Team: Mechele Claude, MD as PCP - General (Family Medicine) Pollyann Savoy, MD as Consulting Physician (Rheumatology) Cindie Crumbly, MD as Medical Oncologist (Medical Oncology) Therese Sarah, RN as Oncology Nurse Navigator (Medical Oncology) Salena Saner, MD as Consulting Physician (Pulmonary Disease)  Chief Complaint  Patient presents with   Consult    Nodule. Occasional SOB and wheezing. No Cough.  Has had Asthma all of her life.     BACKGROUND: This is a 48 year old former smoker who presents for a lung nodule/mass consult.  She is kindly referred by [employer].   HPI Discussed the use of AI scribe software for clinical note transcription with the patient, who gave verbal consent to proceed.  History of Present Illness   The patient is a 48 year old who presents with a lung nodule/mass enthesis 2.6 cm) consistent with carcinoma.  The lung nodule/mass was discovered following an MRI performed due to persistent headaches, which also revealed a brain tumor. She has not experienced recent weight loss, and her weight has remained stable. No current shortness of breath, although she had noticed it previously. She feels that the steroids she is taking for vasogenic edema of her brain tumor may be masking some symptoms.  She has been experiencing severe headaches for several months, which became persistent for about three weeks, leading to the MRI that revealed a brain tumor. She has also experienced gait unsteadiness, which has shown some improvement with dexamethasone treatment.  She was diagnosed with asthma at the age of 64 and has experienced symptoms intermittently throughout her life, including wheezing and exercise intolerance. Currently, she reports no wheezing, cough and no shortness of breath.  She has a history of smoking, having  quit completely in December after reducing her smoking over the past two years. She previously smoked about a pack a day or more.  Her mother passed away from aggressive breast cancer a few years ago.  She has a 47 year old son and expresses the wish to have aggressive therapy.  We discussed the presence of a lung nodule/mass in the right upper lobe.  I invited her to look at the films however she did not wish to do so.  She has recently been evaluated by [facility] for radiotherapy for her brain lesion however it was recommended that she had neurosurgical excision.  After reviewing her CT scans and her MRI, I am in agreement with this plan.  It was explained to her that biopsy of the right upper lobe lesion would require robotic assisted navigational bronchoscopy (which we have available) under general anesthesia.  Given the degree of vasogenic edema that she has surrounding the lesion she would be at a higher risk for neurological morbidity/mortality with general general anesthesia as this could exacerbate the vasogenic edema.  With the proposed craniotomy there would be at least a means to decrease increase edema if this occurs.  In addition, anesthesiologists specializing in neurosurgical cases would be better equipped to handle potential issues.  I also explained to her that the biopsy would be helpful in making the diagnosis.  She seemed more at ease after this discussion.   Review of Systems A 10 point review of systems was performed and it is as noted above otherwise negative.   Past Medical History:  Diagnosis Date   Abnormal Pap smear of cervix 04/17/2018  Asthma    GERD (gastroesophageal reflux disease)    History of migraine 04/17/2018   Had been managed on Topamax but had side effect. Now not active.   Raynaud's disease    Spondylolisthesis of lumbar region 04/17/2018    Past Surgical History:  Procedure Laterality Date   LAPAROSCOPY     TONSILLECTOMY AND ADENOIDECTOMY  1983     Patient Active Problem List   Diagnosis Date Noted   Metastatic lung carcinoma, right (HCC) 09/27/2023   Metastasis to brain (HCC) 09/27/2023   Situational anxiety 07/27/2021   Family history of breast cancer 07/06/2021   Bilateral hand pain 05/07/2019   Raynaud's phenomenon without gangrene 04/26/2019   Irregular menses 04/26/2019   Nicotine dependence with current use 04/17/2018   Intermittent asthma, uncomplicated 04/17/2018   GERD (gastroesophageal reflux disease) 04/17/2018   History of migraine 04/17/2018   Spondylolisthesis of lumbar region 04/17/2018   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   LGSIL on Pap smear of cervix 04/17/2018   Fibroid uterus 04/17/2018    Family History  Problem Relation Age of Onset   Breast cancer Mother    Asthma Mother    Polymyalgia rheumatica Mother    Ehlers-Danlos syndrome Mother    Cancer Maternal Grandmother    Heart attack Maternal Grandfather    Asthma Brother    Arthritis Brother     Social History   Tobacco Use   Smoking status: Former    Current packs/day: 0.00    Average packs/day: 1 pack/day for 20.0 years (20.0 ttl pk-yrs)    Types: Cigarettes    Quit date: 06/2023    Years since quitting: 0.2   Smokeless tobacco: Never   Tobacco comments:    Started smoking at 48 years old.    Smoked 1 PPD at her heaviest.     Quit smoking in 06/2023- khj 10/03/2023  Substance Use Topics   Alcohol use: Yes    Comment: occasionally     Allergies  Allergen Reactions   Levofloxacin Other (See Comments)    "Red skin, felt like skin was on fire, sensitive to touch"   Sulfa Antibiotics Other (See Comments)    Severe stomach pain   Azithromycin Rash   Doxycycline Rash   Latex Rash    Current Meds  Medication Sig   acetaminophen (TYLENOL) 500 MG tablet Take 1,500 mg by mouth every 6 (six) hours as needed for mild pain (pain score 1-3).   albuterol (VENTOLIN HFA) 108 (90 Base) MCG/ACT inhaler Inhale 2  puffs into the lungs every 4 (four) hours as needed for wheezing or shortness of breath.   dexamethasone (DECADRON) 4 MG tablet Take by mouth. Take 1 tablet (4 mg total) by mouth two (2) times a day for 16 days   ibuprofen (ADVIL) 100 MG tablet Take 200-400 mg by mouth every 6 (six) hours as needed for pain.   methocarbamol (ROBAXIN) 500 MG tablet Take 1 tablet (500 mg total) by mouth 3 (three) times daily. (Patient taking differently: Take 250 mg by mouth every 8 (eight) hours as needed for muscle spasms.)   montelukast (SINGULAIR) 10 MG tablet Take 1 tablet (10 mg total) by mouth at bedtime. (Patient taking differently: Take 10 mg by mouth daily as needed (allergies).)   omeprazole (PRILOSEC) 20 MG capsule Take 20 mg by mouth daily.   oxyCODONE-acetaminophen (PERCOCET/ROXICET) 5-325 MG tablet Take 1 tablet by mouth every 6 (six) hours as needed for severe pain (pain score  7-10).   traZODone (DESYREL) 150 MG tablet Use from 1/3 to 1 tablet nightly as needed for sleep. (Patient taking differently: Take 75 mg by mouth at bedtime as needed for sleep. Use from 1/3 to 1 tablet nightly as needed for sleep.)     There is no immunization history on file for this patient.      Objective:     BP 110/62 (BP Location: Right Arm, Cuff Size: Normal)   Pulse 87   Temp (!) 97.1 F (36.2 C)   Ht 5\' 4"  (1.626 m)   Wt 133 lb 12.8 oz (60.7 kg)   SpO2 100%   BMI 22.97 kg/m   SpO2: 100 % O2 Device: None (Room air)  GENERAL: Well-developed, well-nourished woman, no acute distress HEAD: Normocephalic, atraumatic.  EYES: Pupils equal, round, reactive to light.  No scleral icterus.  MOUTH: Patient intact, oral mucosa moist.  No thrush. NECK: Supple. No thyromegaly. Trachea midline. No JVD.  No adenopathy. PULMONARY: Good air entry bilaterally.  No adventitious sounds. CARDIOVASCULAR: S1 and S2. Regular rate and rhythm.  No rubs, murmurs or gallops heard. ABDOMEN: Benign. MUSCULOSKELETAL: No joint  deformity, no clubbing, no edema.  NEUROLOGIC: Mild unsteadiness of gait noted SKIN: Intact,warm,dry. PSYCH: Anxious, at times tearful (appropriate).  Representative image of CT chest performed 25 September 2023 I independently reviewed, showing the 2.6 cm spiculated mass in the right upper lobe (arrow):    Assessment & Plan:     ICD-10-CM   1. Mass of upper lobe of right lung -2.6 cm  R91.8    Consistent with carcinoma Suspect adenocarcinoma    2. Metastasis to brain (HCC)  C79.31     3. Centrilobular emphysema (HCC)  J43.2    Will obtain alpha 1 antitrypsin phenotype in the future    4. History of asthma  Z87.09    Currently asymptomatic On steroids for vasogenic edema     Discussion:    Lung mass with suspected metastasis   Alissia Lory presents with a lung mass consistent with carcinoma, likely adenocarcinoma, with suspected metastasis to the brain. Her smoking history is a risk factor. Differential diagnosis includes adenocarcinoma and small cell lung cancer, with the latter being less likely due to normal sodium levels. A craniotomy is planned to remove the brain tumor for relief and definitive diagnosis. Radiation was considered but may cause significant increase in vasogenic edema, risking severe complications such as catastrophic neurologic disability.   - Proceed with craniotomy on March 19th at [facility] to remove brain tumor and obtain diagnosis.   - Monitor for PET scan results scheduled for March 13th.   - Schedule follow-up appointment in four weeks to review results and assess progress.    Asthma   Jaylnn's asthma, diagnosed at age 68, is currently well-managed with minimal symptoms, likely due to steroid treatment.  Of note on CT chest she has centrilobular emphysema, at a future appointment will proceed with obtaining alpha-1 phenotype and level. - Monitor asthma symptoms and adjust treatment as necessary.    Goals of Care   Lyla Son is committed to aggressive treatment to  be present for her 68 year old son, especially after her mother's death from aggressive breast cancer. She is determined to overcome her health challenges.   - Support Milica's treatment goals and provide necessary care to achieve them.      Advised if symptoms do not improve or worsen, to please contact office for sooner follow up or seek emergency care.  I spent 45 minutes of dedicated to the care of this patient on the date of this encounter to include pre-visit review of records, face-to-face time with the patient discussing conditions above, post visit ordering of testing, clinical documentation with the electronic health record, making appropriate referrals as documented, and communicating necessary findings to members of the patients care team.   C. Danice Goltz, MD Advanced Bronchoscopy PCCM [facility]    *This note was dictated using voice recognition software/Dragon.  Despite best efforts to proofread, errors can occur which can change the meaning. Any transcriptional errors that result from this process are unintentional and may not be fully corrected at the time of dictation.

## 2023-10-05 ENCOUNTER — Ambulatory Visit (HOSPITAL_COMMUNITY)
Admission: RE | Admit: 2023-10-05 | Discharge: 2023-10-05 | Disposition: A | Discharge status: Home / Self Care | Source: Ambulatory Visit | Attending: Oncology | Admitting: Oncology

## 2023-10-05 DIAGNOSIS — C3490 Malignant neoplasm of unspecified part of unspecified bronchus or lung: Secondary | ICD-10-CM | POA: Diagnosis not present

## 2023-10-05 DIAGNOSIS — Z01818 Encounter for other preprocedural examination: Secondary | ICD-10-CM | POA: Diagnosis not present

## 2023-10-05 DIAGNOSIS — R911 Solitary pulmonary nodule: Secondary | ICD-10-CM | POA: Diagnosis not present

## 2023-10-05 MED ORDER — FLUDEOXYGLUCOSE F - 18 (FDG) INJECTION
6.2000 | Freq: Once | INTRAVENOUS | Status: AC | PRN
  Administered 2023-10-05: 6.2 via INTRAVENOUS

## 2023-10-11 DIAGNOSIS — Z9889 Other specified postprocedural states: Secondary | ICD-10-CM | POA: Diagnosis not present

## 2023-10-11 DIAGNOSIS — C3490 Malignant neoplasm of unspecified part of unspecified bronchus or lung: Secondary | ICD-10-CM | POA: Diagnosis not present

## 2023-10-11 DIAGNOSIS — R52 Pain, unspecified: Secondary | ICD-10-CM | POA: Diagnosis not present

## 2023-10-11 DIAGNOSIS — G939 Disorder of brain, unspecified: Secondary | ICD-10-CM | POA: Diagnosis not present

## 2023-10-11 DIAGNOSIS — G935 Compression of brain: Secondary | ICD-10-CM | POA: Diagnosis not present

## 2023-10-11 DIAGNOSIS — Z006 Encounter for examination for normal comparison and control in clinical research program: Secondary | ICD-10-CM | POA: Diagnosis not present

## 2023-10-11 DIAGNOSIS — F419 Anxiety disorder, unspecified: Secondary | ICD-10-CM | POA: Diagnosis not present

## 2023-10-11 DIAGNOSIS — G936 Cerebral edema: Secondary | ICD-10-CM | POA: Diagnosis not present

## 2023-10-11 DIAGNOSIS — K219 Gastro-esophageal reflux disease without esophagitis: Secondary | ICD-10-CM | POA: Diagnosis not present

## 2023-10-11 DIAGNOSIS — F17210 Nicotine dependence, cigarettes, uncomplicated: Secondary | ICD-10-CM | POA: Diagnosis not present

## 2023-10-11 DIAGNOSIS — C7931 Secondary malignant neoplasm of brain: Secondary | ICD-10-CM | POA: Diagnosis not present

## 2023-10-11 DIAGNOSIS — G43909 Migraine, unspecified, not intractable, without status migrainosus: Secondary | ICD-10-CM | POA: Diagnosis not present

## 2023-10-11 DIAGNOSIS — F32A Depression, unspecified: Secondary | ICD-10-CM | POA: Diagnosis not present

## 2023-10-11 DIAGNOSIS — Z882 Allergy status to sulfonamides status: Secondary | ICD-10-CM | POA: Diagnosis not present

## 2023-10-11 DIAGNOSIS — I7300 Raynaud's syndrome without gangrene: Secondary | ICD-10-CM | POA: Diagnosis not present

## 2023-10-11 HISTORY — PX: OTHER SURGICAL HISTORY: SHX169

## 2023-10-12 ENCOUNTER — Institutional Professional Consult (permissible substitution): Admitting: Emergency Medicine

## 2023-10-13 ENCOUNTER — Telehealth: Payer: Self-pay

## 2023-10-13 NOTE — Patient Instructions (Addendum)
 Visit Information  Thank you for taking time to visit with me today. Please don't hesitate to contact me if I can be of assistance to you before your next scheduled telephone appointment.  Our next appointment is by telephone on 10/19/23 at 9:00am with Irving Shows, RN   Following is a copy of your care plan:   Goals Addressed             This Visit's Progress    TOC Care Plan       Current Barriers:  Medication management Several medication changes  Recent brain Surgery  RNCM Clinical Goal(s):  Patient will work with the Care Management team over the next 30 days to address Transition of Care Barriers: Medication Management Support at home Provider appointments take all medications exactly as prescribed and will call provider for medication related questions as evidenced by no missed medication doses attend all scheduled medical appointments:  as evidenced by no missed appointments or testing  continue to work with RN Care Manager to address care management and care coordination needs  demonstrate ongoing self health care management ability  as evidenced by increased independence and returning to baseline through collaboration with RN Care manager, provider, and care team.   Interventions: Evaluation of current treatment plan related to  self management and patient's adherence to plan as established by provider  Transitions of Care:  New goal. Sick Day Rules Reviewed Doctor Visits  - discussed the importance of doctor visits Post discharge activity limitations prescribed by provider reviewed Post-op wound/incision care reviewed with patient/caregiver Reviewed Signs and symptoms of infection  Patient Goals/Self-Care Activities: Participate in Transition of Care Program/Attend TOC scheduled calls Take all medications as prescribed Attend all scheduled provider appointments Call pharmacy for medication refills 3-7 days in advance of running out of medications Attend church or  other social activities Perform all self care activities independently  Perform IADL's (shopping, preparing meals, housekeeping, managing finances) independently Call provider office for new concerns or questions   Follow Up Plan:  Telephone follow up appointment with care management team member scheduled for:  10/19/23 @ 11:00 am with Irving Shows RN  The patient has been provided with contact information for the care management team and has been advised to call with any health related questions or concerns.          Medication review  Reviewed current home medications -- provided education as needed. Patient is aware of potential side effects and was encouraged to notify PCP for any adverse side effects or unwanted symptoms not relieved with interventions  Medication updates and changes reviewed with patient   STOP taking these medications   amoxicillin-clavulanate 875-125 mg per tablet Commonly known as: AUGMENTIN- not current   IBUPROFEN IB 100 MG chewable tablet Generic drug: ibuprofen  ondansetron 4 MG disintegrating tablet Commonly known as: ZOFRAN-ODT     START taking these medications   blood-glucose meter kit Use as instructed  glucose blood test strip Generic drug: blood sugar diagnostic Use to check blood sugar as directed with insulin 3 times a day & for symptoms of high or low blood sugar.  insulin lispro 100 unit/mL injection pen Commonly known as: HumaLOG Take 0-20 units for each meal and for blood glucose results: Blood glucose 51-70: Consume juice or crackers. 71-150: Give 0 units. 151-200: Give 1 unit. 201-250: Give 2 units. 251-300: Give 3 units. 301-350: Give 4 units. 351-400: Give 5 units.  lancets Misc Use to check blood sugar as directed  with insulin 3 times a day & for symptoms of high or low blood sugar.  oxyCODONE 15 MG immediate release tablet Commonly known as: ROXICODONE Take 0.5 tablets (7.5 mg total) by mouth every four (4) hours as  needed for up to 7 days.  pen needle, diabetic 32 gauge x 5/32" (4 mm) Ndle Use with insulin up to 4 times/day as needed.  senna 8.6 mg tablet Commonly known as: SENOKOT Take 2 tablets by mouth daily.  tizanidine 2 MG tablet Commonly known as: ZANAFLEX Take 1 tablet (2 mg total) by mouth every six (6) hours as needed (neck pain).     CHANGE how you take these medications   acetaminophen 500 MG tablet Commonly known as: TYLENOL Take 2 tablets (1,000 mg total) by mouth every six (6) hours. What changed:  how much to take when to take this reasons to take this  dexAMETHasone 4 MG tablet Commonly known as: DECADRON Take 2.5 tablets (10 mg total) by mouth every six (6) hours for 2 days, THEN 1.5 tablets (6 mg total) every six (6) hours for 2 days, THEN 1 tablet (4 mg total) every six (6) hours for 2 days, THEN 1 tablet (4 mg total) two (2) times a day for 2 days, THEN 0.5 tablets (2 mg total) two (2) times a day for 2 days, THEN 0.5 tablets (2 mg total) daily for 2 days. Then stop. Start taking on: October 12, 2023  Updated medication list to include these medications nn discharge   ascorbic acid 500 MG tablet Generic drug: ascorbic acid (vitamin C) Take 1 tablet (500 mg total) by mouth daily.  co-enzyme Q-10 30 mg capsule Take 1 capsule (30 mg total) by mouth Three (3) times a day.  docusate sodium 100 MG capsule Commonly known as: COLACE Take 1 capsule (100 mg total) by mouth Three (3) times a day as needed for constipation.  famotidine 20 MG tablet Commonly known as: PEPCID Take 1 tablet (20 mg total) by mouth two (2) times a day.  guaiFENesin 600 mg 12 hr tablet Commonly known as: MUCINEX Take 1 tablet (600 mg total) by mouth every twelve (12) hours.  hydrOXYzine 25 MG tablet Commonly known as: ATARAX Take 1 tablet (25 mg total) by mouth Three (3) times a day.  magnesium oxide 400 mg (241.3 mg elemental) tablet Commonly known as: MAG-OX Take 1 tablet (400 mg  total) by mouth daily.  melatonin 1 mg Tab tablet Take 5 tablets (5 mg total) by mouth nightly.  polyethylene glycol 17 gram packet Commonly known as: MIRALAX Take 17 g by mouth daily.      Patient will call 911 for Medical Emergencies or Life -Threatening Symptoms.  Reviewed goals for care Patient/ Caregiver verbalizes understanding of instructions with the plan of care . The  Patient / Caregiver was encouraged to make informed decisions about care, actively participate in managing health conditions, and implement lifestyle changes as needed to promote independence and self-management of healthcare. SDOH screenings have been completed and addressed if indicted.  There are no reported barriers to care.    Follow-up Plan PCP hospital visit updated to  3/26  She has the following visits scheduled   She is  tolerating a regular diet. She is  voiding  spontaneously,  her pain  is controlled with P.O. pain medication- she took Percocet this am ,  She is ambulating independently and states she feels much better she has support at home. She is checking her  BG and states she is comfortable with Insulin injections and has all the supplies she needs    Neurosurgery-Will call in follow up in a few weeks   Oct 16, 2023 1:15 PM MRI Brain With and Without Contrast with IC MRI RM 2 change location  [facility])   Oct 31, 2023 2:30 PM [facility]COL[facility] [place] [facility] [place]   Oct 31, 2023 4:00 PM [facility] RAD[facility]COL[facility] [place])    [facility] Case Management Nurse will provide follow-up and on-going assessment ,evaluation and education of disease processes, recommended interventions for both chronic and acute medical conditions ,  along with ongoing review of symptoms ,medication reviews / reconciliation during each weekly call . Any updates , inconsistencies, discrepancies or acute care concerns  will be addressed and routed to the correct Practitioner if indicated   The patient has been provided with contact information for the care management team and has been advised to call with any health-related questions or concerns. Follow up call  with Care Team  as scheduled,or sooner should any new problems arise.  [facility]  Please call the care guide team at [phone]  if you need to cancel or reschedule your appointment . For scheduled calls -Three attempts will be made to reach you -if the scheduled call is missed or  we are unable to reach the you after 3 attempts no additional outreach attempts will be made and the TOC follow-up will be closed .   If you need to speak to a Nurse you may  call me directly at the number below or if I am unavailable,and  your need is urgent  please call the main [facility] number at [phone] and ask to speak with one of the [facility] ( Transition of Care )  Nurses  .  Patient was encouraged to Contact PCP with any changes in baseline or  medication regimen,  changes in health status  /  well-being, safety concerns, including falls any questions or concerns regarding ongoing medical care, any difficulty obtaining or picking up prescriptions, any changes or worsening in condition- including  symptoms not relieved  with interventions                                                                            Additionally, If you experience worsening of your symptoms, develop shortness of breath, If you are experiencing a medical emergency,  develop suicidal or homicidal thoughts you must seek medical attention immediately by calling 911 or report to your local emergency department or urgent care.   If you have a non-emergency medical problem during routine business hours, please contact your provider's office and ask to speak with a nurse.       Please take the time to read instructions/literature along with the possible adverse reactions/side effects for  all the Medicines that have been prescribed to you. Only take newly prescribed  Medications after you have completely understood and accept all the possible adverse reactions/side effects.   Do not take more than prescribed Medications for  Pain, Sleep and Anxiety. Do not drive when taking Pain medications or  sleep aid/ insomnia  medications It is not advisable to combine anxiety, sleep and pain medications without talking with your primary care practitioner    If you are experiencing a Mental Health or Behavioral Health Crisis or need someone to talk to Please call the Suicide and Crisis Lifeline: 988 You may also call the [place] National Suicide Prevention Lifeline: 6825803710 or TTY: 769-509-7843 TTY 919-175-8420) to talk to a trained counselor.  You may call the Behavioral Health Crisis Line at 641-855-4799, at any time, 24 hours a day, 7 days a week- however If you are in danger or need immediate medical attention, call 911.   If you would like help to quit smoking, call 1-800-QUIT-NOW ( (907)063-6327) OR Espaol: 1-855-Djelo-Ya (1-607-371-0626) o para ms informacin haga clic aqu or Text READY to 948-546 to register via text.   Susa Loffler , BSN, RN [place]   [facility] RN Care Manager Direct Dial [phone]  Website: [email or web address]

## 2023-10-13 NOTE — Transitions of Care (Post Inpatient/ED Visit) (Signed)
 10/13/2023  Name: Julie Horn MRN: [id number] DOB: November 10, 1975  Today's TOC FU Call Status: Today's TOC FU Call Status:: Successful TOC FU Call Completed TOC FU Call Complete Date: 10/13/23 Patient's Name and Date of Birth confirmed.  Transition Care Management Follow-up Telephone Call Date of Discharge: 10/12/23 Discharge Facility: Other [facility]) Name of Other (Non[facility]) Discharge Facility: [facility]  Medical Type of Discharge: Inpatient Admission Primary Inpatient Discharge Diagnosis:: Brain lesion EXCIS INFRATENT BRAIN TUMOR How have you been since you were released from the hospital?: Better Any questions or concerns?: Yes Patient Questions/Concerns:: Reviewed medications and she will call PCP for refill Atarax Patient Questions/Concerns Addressed: Other:  Items Reviewed: Medications obtained,verified, and reconciled?: Yes (Medications Reviewed) (Medication reconciliation completed based on recent discharge summary Patient taking medications as instructed and is aware of any changes or dosage adjustments medication regimen. Patient denies questions and reports no barriers to medication adherence) Any new allergies since your discharge?: No Dietary orders reviewed?: Yes Type of Diet Ordered:: Tolerating Reg diet, Recc Carb modified Do you have support at home?: Yes People in Home: child(ren), dependent, significant other Name of Support/Comfort Primary Source: Carollee Herter  Medications Reviewed Today: Medications Reviewed Today     Reviewed by Johnnette Barrios, RN (Registered Nurse) on 10/13/23 at 1004  Med List Status: <None>   Medication Order Taking? Sig Documenting Provider Last Dose Status Informant  acetaminophen (TYLENOL) 500 MG tablet [id number] Yes Take 1,500 mg by mouth every 6 (six) hours as needed for mild pain (pain score 1-3). [provider] Taking Active Self  albuterol (VENTOLIN HFA) 108 (90 Base) MCG/ACT inhaler [id number] Yes Inhale 2 puffs  into the lungs every 4 (four) hours as needed for wheezing or shortness of breath. Mechele Claude, MD Taking Active Self  dexamethasone (DECADRON) 4 MG tablet [id number] No Take by mouth. Take 1 tablet (4 mg total) by mouth two (2) times a day for 16 days  Patient not taking: Reported on 10/13/2023   [provider] Not Taking Active   ibuprofen (ADVIL) 100 MG tablet [id number] No Take 200-400 mg by mouth every 6 (six) hours as needed for pain.  Patient not taking: Reported on 10/13/2023   [provider] Not Taking Active Self  methocarbamol (ROBAXIN) 500 MG tablet [id number] Yes Take 1 tablet (500 mg total) by mouth 3 (three) times daily.  Patient taking differently: Take 250 mg by mouth every 8 (eight) hours as needed for muscle spasms.   Vickki Hearing, MD Taking Active Self  montelukast (SINGULAIR) 10 MG tablet [id number] Yes Take 1 tablet (10 mg total) by mouth at bedtime.  Patient taking differently: Take 10 mg by mouth daily as needed (allergies).   Mechele Claude, MD Taking Active Self  omeprazole (PRILOSEC) 20 MG capsule [id number] Yes Take 20 mg by mouth daily. [provider] Taking Active   oxyCODONE-acetaminophen (PERCOCET/ROXICET) 5-325 MG tablet [id number] Yes Take 1 tablet by mouth every 6 (six) hours as needed for severe pain (pain score 7-10). Durwin Glaze, MD Taking Active   traZODone (DESYREL) 150 MG tablet [id number] Yes Use from 1/3 to 1 tablet nightly as needed for sleep.  Patient taking differently: Take 75 mg by mouth at bedtime as needed for sleep. Use from 1/3 to 1 tablet nightly as needed for sleep.   Mechele Claude, MD Taking Active Self          Medication reconciliation / review completed based on most recent discharge summary and EHR medication list.  Confirmed patient is taking all newly prescribed medications as instructed (any discrepancies are noted in review section)   Patient / Caregiver is aware of any changes to and / or  any  dosage adjustments to medication regimen. Patient/ Caregiver denies questions at this time and reports no barriers to medication adherence.   Updates and changes reviewed with the patient  She is continuing her previous home medications  STOP taking these medications   amoxicillin-clavulanate 875-125 mg per tablet Commonly known as: AUGMENTIN- not current   IBUPROFEN IB 100 MG chewable tablet Generic drug: ibuprofen  ondansetron 4 MG disintegrating tablet Commonly known as: ZOFRAN-ODT  START taking these medications   blood-glucose meter kit Use as instructed  glucose blood test strip Generic drug: blood sugar diagnostic Use to check blood sugar as directed with insulin 3 times a day & for symptoms of high or low blood sugar.  insulin lispro 100 unit/mL injection pen Commonly known as: HumaLOG Take 0-20 units for each meal and for blood glucose results: Blood glucose 51-70: Consume juice or crackers. 71-150: Give 0 units. 151-200: Give 1 unit. 201-250: Give 2 units. 251-300: Give 3 units. 301-350: Give 4 units. 351-400: Give 5 units.  lancets Misc Use to check blood sugar as directed with insulin 3 times a day & for symptoms of high or low blood sugar.  oxyCODONE 15 MG immediate release tablet Commonly known as: ROXICODONE Take 0.5 tablets (7.5 mg total) by mouth every four (4) hours as needed for up to 7 days.  pen needle, diabetic 32 gauge x 5/32" (4 mm) Ndle Use with insulin up to 4 times/day as needed.  senna 8.6 mg tablet Commonly known as: SENOKOT Take 2 tablets by mouth daily.  tizanidine 2 MG tablet Commonly known as: ZANAFLEX Take 1 tablet (2 mg total) by mouth every six (6) hours as needed (neck pain).   CHANGE how you take these medications   acetaminophen 500 MG tablet Commonly known as: TYLENOL Take 2 tablets (1,000 mg total) by mouth every six (6) hours. What changed:  how much to take when to take this reasons to take this  dexAMETHasone 4 MG  tablet Commonly known as: DECADRON Take 2.5 tablets (10 mg total) by mouth every six (6) hours for 2 days, THEN 1.5 tablets (6 mg total) every six (6) hours for 2 days, THEN 1 tablet (4 mg total) every six (6) hours for 2 days, THEN 1 tablet (4 mg total) two (2) times a day for 2 days, THEN 0.5 tablets (2 mg total) two (2) times a day for 2 days, THEN 0.5 tablets (2 mg total) daily for 2 days. Then stop. Start taking on: October 12, 2023 What changed: See the new instructions.  Updated medication list to include these medications nn discharge    CONTINUE taking these medications   ascorbic acid 500 MG tablet Generic drug: ascorbic acid (vitamin C) Take 1 tablet (500 mg total) by mouth daily.  co-enzyme Q-10 30 mg capsule Take 1 capsule (30 mg total) by mouth Three (3) times a day.  docusate sodium 100 MG capsule Commonly known as: COLACE Take 1 capsule (100 mg total) by mouth Three (3) times a day as needed for constipation.  famotidine 20 MG tablet Commonly known as: PEPCID Take 1 tablet (20 mg total) by mouth two (2) times a day.  guaiFENesin 600 mg 12 hr tablet Commonly known as: MUCINEX Take 1 tablet (600 mg total) by mouth every twelve (12) hours.  hydrOXYzine 25 MG tablet Commonly known as: ATARAX Take 1 tablet (25 mg total) by mouth Three (3) times a day.  magnesium oxide 400 mg (241.3 mg elemental) tablet Commonly known as: MAG-OX Take 1 tablet (400 mg total) by mouth daily.  melatonin 1 mg Tab tablet Take 5 tablets (5 mg total) by mouth nightly.  polyethylene glycol 17 gram packet Commonly known as: MIRALAX Take 17 g by mouth daily.      Home Care and Equipment/Supplies: Were Home Health Services Ordered?: No Any new equipment or medical supplies ordered?: No  Reviewed the following directions regarding surgical sutures  Examine the surgical site at least twice daily. Please change the dressing when wet/dirty until the 3rd day after surgery, then keep the  wound open to air. Do not use ointments, creams, gels, peroxide, or blow dryers on the wound. You may shower on the 4th day after surgery. Please shower daily starting on day 4 and use baby shampoo for head incisions to clean the wound. Do not scrub too vigorously. Do not submerge the wound under water (swimming or tub soaking) until 4-6 weeks after surgery. After showering, pat the wound dry and leave open to air. You have an appointment in approximately 2 weeks to have the stitches/staples removed. If you did not receive an appointment while in the hospital, the clinic will contact you will an appointment.    Functional Questionnaire: Do you need assistance with bathing/showering or dressing?: No Do you need assistance with meal preparation?: No Do you need assistance with eating?: No Do you have difficulty maintaining continence: No Do you need assistance with getting out of bed/getting out of a chair/moving?: No Do you have difficulty managing or taking your medications?: No (Uses pill box)  Follow up appointments reviewed: PCP Follow-up appointment confirmed?: Yes Date of PCP follow-up appointment?: 10/30/23 Follow-up Provider: [facility] Follow-up appointment confirmed?: Yes Date of Specialist follow-up appointment?: 10/16/23 Follow-Up Specialty Provider:: Oct 16, 2023 1:15 PM  MRI Brain With and Without Contrast with IC MRI RM 2 change location   IMG MRI IMAGING CENTER [facility])     Oct 31, 2023 2:30 PM  [facility] [place]     Oct 31, 2023 4:00 PM  [facility] RADI[facility] [place] Do you need transportation to your follow-up appointment?: No Do you understand care options if your condition(s) worsen?: Yes-patient verbalized understanding  SDOH Interventions Today    Flowsheet Row Most Recent Value  SDOH Interventions   Food Insecurity Interventions  Intervention Not Indicated  Housing Interventions Intervention Not Indicated  Transportation Interventions Intervention Not Indicated, Patient Resources (Friends/Family), Payor Benefit  Utilities Interventions Intervention Not Indicated       Goals Addressed             This Visit's Progress    TOC Care Plan       Current Barriers:  Medication management Several medication changes  Recent brain Surgery  RNCM Clinical Goal(s):  Patient will work with the Care Management team over the next 30 days to address Transition of Care Barriers: Medication Management Support at home Provider appointments take all medications exactly as prescribed and will call provider for medication related questions as evidenced by no missed medication doses attend all scheduled medical appointments:  as evidenced by no missed appointments or testing  continue to work with RN Care Manager to address care management and care coordination  needs  demonstrate ongoing self health care management ability  as evidenced by increased independence and returning to baseline through collaboration with RN Care manager, provider, and care team.   Interventions: Evaluation of current treatment plan related to  self management and patient's adherence to plan as established by provider  Transitions of Care:  New goal. Sick Day Rules Reviewed Doctor Visits  - discussed the importance of doctor visits Post discharge activity limitations prescribed by provider reviewed Post-op wound/incision care reviewed with patient/caregiver Reviewed Signs and symptoms of infection  Patient Goals/Self-Care Activities: Participate in Transition of Care Program/Attend TOC scheduled calls Take all medications as prescribed Attend all scheduled provider appointments Call pharmacy for medication refills 3-7 days in advance of running out of medications Attend church or other social activities Perform all self care activities  independently  Perform IADL's (shopping, preparing meals, housekeeping, managing finances) independently Call provider office for new concerns or questions   Follow Up Plan:  Telephone follow up appointment with care management team member scheduled for:  10/19/23 @ 11:00 am with Irving Shows RN  The patient has been provided with contact information for the care management team and has been advised to call with any health related questions or concerns.          Discussed VBCI  TOC program and weekly calls to patient to assess condition/status, medication management  and provide support/education as indicated . Patient/ Caregiver voiced understanding and is  agreeable to 30 day program    Plan  PCP hospital visit updated to  3/26  She has the following visits scheduled   She is  tolerating a regular diet. She is  voiding  spontaneously,  her pain  is controlled with P.O. pain medication- she took Percocet this am ,  She is ambulating independently and states she feels much better she has support at home. She is checking her BG and states she is comfortable with Insulin injections and has all the supplies she needs    Neurosurgery-Will call in follow up in a few weeks   Oct 16, 2023 1:15 PM MRI Brain With and Without Contrast with IC MRI RM 2 change location  IMG MRI IMAGING CENTER [facility])   Oct 31, 2023 2:30 PM [facility]COL[facility] [place] [facility])   Oct 31, 2023 4:00 PM [facility] [place]      Susa Loffler , BSN, RN [facility]   VBCI-Population Health RN Care Manager Direct Dial [phone]  Fax: [phone] Website: [email or web address]

## 2023-10-18 ENCOUNTER — Ambulatory Visit (INDEPENDENT_AMBULATORY_CARE_PROVIDER_SITE_OTHER): Admitting: Family Medicine

## 2023-10-18 ENCOUNTER — Encounter: Payer: Self-pay | Admitting: Family Medicine

## 2023-10-18 VITALS — BP 120/79 | HR 74 | Temp 97.8°F | Ht 64.0 in | Wt 134.0 lb

## 2023-10-18 DIAGNOSIS — G4701 Insomnia due to medical condition: Secondary | ICD-10-CM | POA: Diagnosis not present

## 2023-10-18 DIAGNOSIS — C7801 Secondary malignant neoplasm of right lung: Secondary | ICD-10-CM

## 2023-10-18 DIAGNOSIS — C7931 Secondary malignant neoplasm of brain: Secondary | ICD-10-CM | POA: Diagnosis not present

## 2023-10-18 DIAGNOSIS — F418 Other specified anxiety disorders: Secondary | ICD-10-CM | POA: Diagnosis not present

## 2023-10-18 MED ORDER — ONDANSETRON 4 MG PO TBDP: 4.0000 mg | ORAL_TABLET | Freq: Three times a day (TID) | ORAL | 5 refills | Status: DC | PRN

## 2023-10-18 MED ORDER — HYDROXYZINE PAMOATE 25 MG PO CAPS
25.0000 mg | ORAL_CAPSULE | Freq: Three times a day (TID) | ORAL | 5 refills | Status: DC
Start: 2023-10-18 — End: 2024-05-29

## 2023-10-18 NOTE — Progress Notes (Signed)
 Subjective:  Patient ID: Julie Horn, female    DOB: Jan 09, 1976  Age: 48 y.o. MRN: [id number]  CC: Medication Refill (NEEDS NEW SCRIPT FOR HYRDROXYZINE FOR SLEEP. )   HPI   Julie Horn presents for follow-up postop from her cerebellar tumor excision surgery.  This was done on March 19.  She is recovering well.  She tells me that she continues to take Decadron.  She is tapering.  She has not had a glucose reading above 150.  She is moving in with her boyfriend so that he can help take care of of her and her 84 year old son.  She is trying to make plans in case of a bad outcome.  However she is working through [facility] to receive radiation chemo and surgical removal of the lung tumor.  There can be no lung surgery she is told, until after the cerebellar treatment has been completed including radiation etc.  She has some drawing sensation behind her right ear near the surgical site.  She has some dull headache but she is in very little pain currently.  She is having a lot of trouble sleeping and would like to have hydroxyzine to use as it worked well for her in the hospital.  She requested for 3 times a day.  She says it also helps some with anxiety.  Of note is that she takes melatonin and that helps some but is not adequate.     10/18/2023   10:36 AM 09/25/2023    1:09 PM 07/17/2023    1:23 PM  Depression screen PHQ 2/9  Decreased Interest 0 0 0  Down, Depressed, Hopeless 0 1 1  PHQ - 2 Score 0 1 1  Altered sleeping 0 0 0  Tired, decreased energy 3 2 1   Change in appetite 0 0 1  Feeling bad or failure about yourself  0 0 0  Trouble concentrating 3 2 1   Moving slowly or fidgety/restless 1 0 0  Suicidal thoughts 0 0 0  PHQ-9 Score 7 5 4   Difficult doing work/chores Somewhat difficult Not difficult at all Somewhat difficult    History Angel[place] has a past medical history of Abnormal Pap smear of cervix (04/17/2018), Asthma, GERD (gastroesophageal reflux disease), History  of migraine (04/17/2018), Raynaud's disease, and Spondylolisthesis of lumbar region (04/17/2018).   She has a past surgical history that includes Tonsillectomy and adenoidectomy (1983) and laparoscopy.   Her family history includes Arthritis in her brother; Asthma in her brother and mother; Breast cancer in her mother; Cancer in her maternal grandmother; Ehlers-Danlos syndrome in her mother; Heart attack in her maternal grandfather; Polymyalgia rheumatica in her mother.She reports that she quit smoking about 3 months ago. Her smoking use included cigarettes. She has a 20 pack-year smoking history. She has never used smokeless tobacco. She reports current alcohol use. She reports that she does not currently use drugs.    ROS Review of Systems  Constitutional:  Positive for appetite change.  HENT: Negative.  Negative for congestion.   Eyes:  Negative for visual disturbance.  Respiratory:  Negative for shortness of breath.   Cardiovascular:  Negative for chest pain.  Gastrointestinal:  Negative for abdominal pain, constipation, diarrhea and nausea.  Genitourinary:  Negative for difficulty urinating.  Musculoskeletal:  Negative for arthralgias and myalgias.  Neurological:  Positive for headaches. Negative for dizziness, tremors, seizures, syncope and speech difficulty.  Psychiatric/Behavioral:  Negative for sleep disturbance.     Objective:  BP  120/79   Pulse 74   Temp 97.8 F (36.6 C)   Ht 5\' 4"  (1.626 m)   Wt 134 lb (60.8 kg)   SpO2 98%   BMI 23.00 kg/m   BP Readings from Last 3 Encounters:  10/18/23 120/79  10/03/23 110/62  09/27/23 136/75    Wt Readings from Last 3 Encounters:  10/18/23 134 lb (60.8 kg)  10/03/23 133 lb 12.8 oz (60.7 kg)  09/27/23 128 lb (58.1 kg)     Physical Exam Constitutional:      General: She is not in acute distress.    Appearance: She is well-developed.  Cardiovascular:     Rate and Rhythm: Normal rate and regular rhythm.  Pulmonary:      Breath sounds: Normal breath sounds.  Musculoskeletal:        General: Normal range of motion.  Skin:    General: Skin is warm and dry.     Comments: Suture line at the right mastoid region is intact and free of signs of infection.  Neurological:     General: No focal deficit present.     Mental Status: She is alert and oriented to person, place, and time.     Motor: No weakness.     Coordination: Coordination normal.      Assessment & Plan:  Metastatic lung carcinoma, right (HCC)  Metastasis to brain (HCC)  Insomnia due to medical condition  Situational anxiety  Other orders -     hydrOXYzine Pamoate; Take 1 capsule (25 mg total) by mouth 3 (three) times daily. For anxiety and sleep.  Dispense: 90 capsule; Refill: 5 -     Ondansetron; Take 1 tablet (4 mg total) by mouth every 8 (eight) hours as needed for nausea or vomiting.  Dispense: 30 tablet; Refill: 5     Follow-up: Return in about 3 months (around 01/18/2024), or if symptoms worsen or fail to improve.  Mechele Claude, M.D.

## 2023-10-19 ENCOUNTER — Other Ambulatory Visit: Payer: Self-pay

## 2023-10-19 ENCOUNTER — Other Ambulatory Visit: Payer: Self-pay | Admitting: *Deleted

## 2023-10-19 NOTE — Patient Outreach (Signed)
 Care Management  Transitions of Care Program Transitions of Care Post-discharge week 2   10/19/2023 Name: Julie Horn MRN: [id number] DOB: [birth year]  Subjective: Julie Horn is a 48 y.o. year old female who is a primary care patient of Stacks, Broadus John, MD. The Care Management team Engaged with patient Engaged with patient by telephone to assess and address transitions of care needs.   Consent to Services:  Patient was given information about care management services, agreed to services, and gave verbal consent to participate.   Assessment: Pt to follow up with pulmonary and oncology in next few weeks, saw primary care provider 10/18/23, pt states she has all medications and taking as prescribed, states " today is the best day I've had"         SDOH Interventions    Flowsheet Row Telephone from 10/13/2023 in [place] [facility]PART[facility] Office Visit from 07/17/2023 in [facility] Office Visit from 05/22/2023 in [facility] Office Visit from 01/04/2022 in [facility]  SDOH Interventions      Food Insecurity Interventions Intervention Not Indicated -- -- --  Housing Interventions Intervention Not Indicated -- -- --  Transportation Interventions Intervention Not Indicated, Patient Resources (Friends/Family), Payor Benefit -- -- --  Utilities Interventions Intervention Not Indicated -- -- --  Depression Interventions/Treatment  -- PHQ2-9 Score <4 Follow-up Not Indicated Counseling PHQ2-9 Score <4 Follow-up Not Indicated        Goals Addressed             This Visit's Progress    TOC Care Plan       Current Barriers:  Medication management Several medication changes  Recent brain Surgery Patient reports she saw primary care provider 10/18/23, states incision WNL, CBG readings all under 150, using stool softener daily, had bowel movement 3/26, pt using  zofran prn, appetite is better "but sometimes food doesn't taste good"  RNCM Clinical Goal(s):  Patient will work with the Care Management team over the next 30 days to address Transition of Care Barriers: Medication Management Support at home Provider appointments take all medications exactly as prescribed and will call provider for medication related questions as evidenced by no missed medication doses attend all scheduled medical appointments:  as evidenced by no missed appointments or testing  continue to work with RN Care Manager to address care management and care coordination needs  demonstrate ongoing self health care management ability  as evidenced by increased independence and returning to baseline through collaboration with RN Care manager, provider, and care team.   Interventions: Evaluation of current treatment plan related to  self management and patient's adherence to plan as established by provider  Transitions of Care:  New goal. Sick Day Rules Reviewed Doctor Visits  - discussed the importance of doctor visits Post discharge activity limitations prescribed by provider reviewed Post-op wound/incision care reviewed with patient/caregiver Reviewed Signs and symptoms of infection Reviewed good sleep hygiene Reviewed diet and importance of eating healthy, limiting/ avoiding sugary foods  Patient Goals/Self-Care Activities: Participate in Transition of Care Program/Attend TOC scheduled calls Take all medications as prescribed Attend all scheduled provider appointments Call pharmacy for medication refills 3-7 days in advance of running out of medications Attend church or other social activities Perform all self care activities independently  Perform IADL's (shopping, preparing meals, housekeeping, managing finances) independently Call provider office for new concerns or questions  Try to have a consistent night  time routine, warm bath, cool, dark room, white  noise  Follow Up Plan:  Telephone follow up appointment with care management team member scheduled for: 10/27/23 @ 9 am The patient has been provided with contact information for the care management team and has been advised to call with any health related questions or concerns.          Plan: Telephone follow up appointment with care management team member scheduled for: 10/27/23 @ 9am  Irving Shows [facility], BSN RN Care Manager/ Transition of Care [place] [phone]

## 2023-10-19 NOTE — Patient Instructions (Signed)
 Visit Information  Thank you for taking time to visit with me today. Please don't hesitate to contact me if I can be of assistance to you before our next scheduled telephone appointment.  Following are the goals we discussed today:   Goals Addressed             This Visit's Progress    TOC Care Plan       Current Barriers:  Medication management Several medication changes  Recent brain Surgery Patient reports she saw primary care provider 10/18/23, states incision WNL, CBG readings all under 150, using stool softener daily, had bowel movement 3/26, pt using zofran prn, appetite is better "but sometimes food doesn't taste good"  RNCM Clinical Goal(s):  Patient will work with the Care Management team over the next 30 days to address Transition of Care Barriers: Medication Management Support at home Provider appointments take all medications exactly as prescribed and will call provider for medication related questions as evidenced by no missed medication doses attend all scheduled medical appointments:  as evidenced by no missed appointments or testing  continue to work with RN Care Manager to address care management and care coordination needs  demonstrate ongoing self health care management ability  as evidenced by increased independence and returning to baseline through collaboration with RN Care manager, provider, and care team.   Interventions: Evaluation of current treatment plan related to  self management and patient's adherence to plan as established by provider  Transitions of Care:  New goal. Sick Day Rules Reviewed Doctor Visits  - discussed the importance of doctor visits Post discharge activity limitations prescribed by provider reviewed Post-op wound/incision care reviewed with patient/caregiver Reviewed Signs and symptoms of infection Reviewed good sleep hygiene Reviewed diet and importance of eating healthy, limiting/ avoiding sugary foods  Patient Goals/Self-Care  Activities: Participate in Transition of Care Program/Attend TOC scheduled calls Take all medications as prescribed Attend all scheduled provider appointments Call pharmacy for medication refills 3-7 days in advance of running out of medications Attend church or other social activities Perform all self care activities independently  Perform IADL's (shopping, preparing meals, housekeeping, managing finances) independently Call provider office for new concerns or questions  Try to have a consistent night time routine, warm bath, cool, dark room, white noise  Follow Up Plan:  Telephone follow up appointment with care management team member scheduled for: 10/27/23 @ 9 am The patient has been provided with contact information for the care management team and has been advised to call with any health related questions or concerns.           Our next appointment is by telephone on 10/27/23 at 9 am  Please call the care guide team at [phone] if you need to cancel or reschedule your appointment.   If you are experiencing a Mental Health or Behavioral Health Crisis or need someone to talk to, please call the Suicide and Crisis Lifeline: 988 call the [place] National Suicide Prevention Lifeline: 775-688-8118 or TTY: (815) 585-9894 TTY 906-226-0802) to talk to a trained counselor call 1-800-273-TALK (toll free, 24 hour hotline) go to [facility] [address] [place] [phone]) call the [facility] Crisis Line: 8633206483 call 911   Patient verbalizes understanding of instructions and care plan provided today and agrees to view in MyChart. Active MyChart status and patient understanding of how to access instructions and care plan via MyChart confirmed with patient.     Irving Shows [facility], BSN RN Care  Manager/ Transition of Care [place] [phone]

## 2023-10-20 NOTE — Progress Notes (Signed)
 The proposed treatment discussed in conference is for discussion purpose only and is not a binding recommendation.  The patients have not been physically examined, or presented with their treatment options.  Therefore, final treatment plans cannot be decided.

## 2023-10-23 ENCOUNTER — Other Ambulatory Visit (HOSPITAL_COMMUNITY): Payer: Self-pay | Admitting: Radiation Oncology

## 2023-10-23 DIAGNOSIS — G939 Disorder of brain, unspecified: Secondary | ICD-10-CM

## 2023-10-24 ENCOUNTER — Inpatient Hospital Stay: Attending: Hematology | Admitting: Oncology

## 2023-10-24 DIAGNOSIS — C7801 Secondary malignant neoplasm of right lung: Secondary | ICD-10-CM

## 2023-10-24 DIAGNOSIS — Z79899 Other long term (current) drug therapy: Secondary | ICD-10-CM | POA: Diagnosis not present

## 2023-10-24 DIAGNOSIS — C779 Secondary and unspecified malignant neoplasm of lymph node, unspecified: Secondary | ICD-10-CM | POA: Diagnosis not present

## 2023-10-24 DIAGNOSIS — C7931 Secondary malignant neoplasm of brain: Secondary | ICD-10-CM | POA: Diagnosis not present

## 2023-10-24 DIAGNOSIS — Z87891 Personal history of nicotine dependence: Secondary | ICD-10-CM | POA: Diagnosis not present

## 2023-10-24 DIAGNOSIS — C3411 Malignant neoplasm of upper lobe, right bronchus or lung: Secondary | ICD-10-CM | POA: Insufficient documentation

## 2023-10-24 NOTE — Patient Instructions (Signed)
 [facility]  Discharge Instructions  You were seen and examined today by Dr. Anders Simmonds.  Dr. Anders Simmonds has reviewed your recent scans and surgical records. Your PET scan revealed cancer in the lung and lymph nodes. The surgery confirmed the brain lesion is in fact lung cancer. Because the cancer had not spread to other internal organs or bones and the brain tumor has been removed - Dr. Anders Simmonds would like to treat your cancer (lung/lymph nodes) with concurrent chemotherapy and radiation therapy curatively.  We can start chemotherapy one week after brain radiation completion.  Prior to the start of chemotherapy, we will request a Port-A-Cath be placed so that we can safely administer the chemotherapy. We will also arrange chemotherapy education, which we do for all patients.  Chemotherapy is once weekly while you are receiving radiation. Radiation is given each weekday for usually about 6 weeks, Dr. Langston Masker will discuss this with you specifically. Following completion of chemotherapy, Dr. Anders Simmonds will discuss the need for immunotherapy as indicated.  Your start date for chemotherapy and immunotherapy is pending. We will decide that based on your brain radiation.  Follow-up as scheduled.  Thank you for choosing [facility] to provide your oncology and hematology care.   To afford each patient quality time with our provider, please arrive at least 15 minutes before your scheduled appointment time. You may need to reschedule your appointment if you arrive late (10 or more minutes). Arriving late affects you and other patients whose appointments are after yours.  Also, if you miss three or more appointments without notifying the office, you may be dismissed from the clinic at the provider's discretion.    Again, thank you for choosing [facility].  Our hope is that these requests will decrease the amount of time that you wait before being  seen by our physicians.   If you have a lab appointment with the Cancer Center - please note that after April 8th, all labs will be drawn in the cancer center.  You do not have to check in or register with the main entrance as you have in the past but will complete your check-in at the cancer center.            _____________________________________________________________  Should you have questions after your visit to [facility], please contact our office at [phone] and follow the prompts.  Our office hours are 8:00 a.m. to 4:30 p.m. Monday - Thursday and 8:00 a.m. to 2:30 p.m. Friday.  Please note that voicemails left after 4:00 p.m. may not be returned until the following business day.  We are closed weekends and all major holidays.  You do have access to a nurse 24-7, just call the main number to the clinic [phone] and do not press any options, hold on the line and a nurse will answer the phone.    For prescription refill requests, have your pharmacy contact our office and allow 72 hours.    Masks are no longer required in the cancer centers. If you would like for your care team to wear a mask while they are taking care of you, please let them know. You may have one support person who is at least 47 years old accompany you for your appointments.

## 2023-10-24 NOTE — Assessment & Plan Note (Signed)
 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 -Patient is scheduled to follow with neurosurgery at [facility] and get radiation there as well. -Reports some pressure sensation at this time but no weakness

## 2023-10-24 NOTE — Addendum Note (Signed)
 Addended byCindie Crumbly on: 10/24/2023 02:54 PM   Modules accepted: Orders

## 2023-10-24 NOTE — Progress Notes (Signed)
 START ON PATHWAY REGIMEN - Non-Small Cell Lung     A cycle is every 7 days, concurrent with RT:     Paclitaxel      Carboplatin   **Always confirm dose/schedule in your pharmacy ordering system**  Patient Characteristics: Preoperative or Nonsurgical Candidate (Clinical Staging), Stage IIA or Stage IIB (N0-1 only), Nonsurgical Candidate Therapeutic Status: Preoperative or Nonsurgical Candidate (Clinical Staging) AJCC T Category: cT1b AJCC N Category: cN1 AJCC M Category: cM0 AJCC 9 Stage Grouping: IIA Check here if patient was staged using an edition other than AJCC Staging 9th Edition: false Intent of Therapy: Curative Intent, Discussed with Patient

## 2023-10-24 NOTE — Progress Notes (Signed)
 Patient Care Team: Mechele Claude, MD as PCP - General (Family Medicine) Pollyann Savoy, MD as Consulting Physician (Rheumatology) Cindie Crumbly, MD as Medical Oncologist (Medical Oncology) Therese Sarah, RN as Oncology Nurse Navigator (Medical Oncology) Salena Saner, MD as Consulting Physician (Pulmonary Disease)  Clinic Day:  10/24/2023  Referring physician: Mechele Claude, MD   CHIEF COMPLAINT:  CC: Metastatic lung adenocarcinoma   ASSESSMENT & PLAN:   Assessment & Plan: Julie Horn  is a 48 y.o. 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  S/p cerebellar lesion resection consistent with lung primary.  Oncology history as below. -PET scan showed limited disease to lung and lymph nodes.  Discussed at tumor board, surgery is not a feasible option secondary to lymph node involvement.  Recommended chemoradiation. -Discussed briefly the risk versus benefits of chemoradiation followed by immunotherapy for a year.  Briefly discussed the agents used and common side effects.Chemotherapy, using a platinum-based agent, aims to enhance radiation sensitivity. Radiation side effects include numbness, tingling, nausea, immunosuppression, fatigue, and tiredness.  Will schedule for chemo education. -Discussed the risk versus benefits of port placement and its role in administering chemotherapy.  Will refer to IR for port placement. -Patient is planned to get brain radiation.  Will plan to start chemo RT to the lung after that.  Will coordinate care with [facility]. -Caris testing on pathology pending at this time. -Nutrition referral. -Will obtain a baseline CT scan before the start of chemo RT.  Return to clinic before the first cycle of chemo RT.   Metastasis to brain [facility]) Cerebellar lesion s/p resection consistent with the poorly  differentiated adenocarcinoma of lung primary. -Patient is scheduled to follow with neurosurgery at [facility] and get radiation there as well. -Reports some pressure sensation at this time but no weakness    The patient understands the plans discussed today and is in agreement with them.  She knows to contact our office if she develops concerns prior to her next appointment.  I provided 30 minutes of face-to-face time during this encounter and > 50% was spent counseling as documented under my assessment and plan.    Cindie Crumbly, MD  [facility] [place] - A DEPT OF Eligha Bridegroom [facility] [address] [place] [address] Dept: [phone] Dept Fax: [phone]   Orders Placed This Encounter  Procedures   IR IMAGING GUIDED PORT INSERTION    Standing Status:   Future    Expected Date:   10/30/2023    Expiration Date:   10/23/2024    Reason for Exam (SYMPTOM  OR DIAGNOSIS REQUIRED):   chemotherapy administration    Is the patient pregnant?:   No    Preferred Imaging Location?:   [facility]    Release to patient:   Immediate     ONCOLOGY HISTORY:   Oncology History  Metastatic lung carcinoma, right (HCC)  09/25/2023 Imaging   CT CAP w contrast:  1. Spiculated mass in the posterior right pulmonary apex measuring 2.6 x 1.4 cm, consistent with primary bronchogenic malignancy. 2. Enlarged right hilar lymph node measuring 1.5 x 1.2 cm, consistent with nodal metastatic disease. 3. Tiny hypodensities in the central liver, incompletely characterized, although statistically likely benign cysts or hemangiomas. Small metastases not strictly excluded. 4. Soft tissue nodule within the right gluteal subcutaneous fat;given location most likely  an injection granuloma, however subcutaneous soft tissue metastases are occasionally seen in metastatic lung malignancy. Attention on follow-up.     09/25/2023 Imaging   MRI brain w wo contrast:  1.  Heterogeneously contrast-enhancing mass of the right cerebellar hemisphere with a large amount of surrounding vasogenic edema. Metastasis and hemangioblastoma are the most common infratentorial tumors in adults. Astrocytoma is another possibility. 2. Mass effect on the fourth ventricle without hydrocephalus.   09/27/2023 Initial Diagnosis   Metastatic lung carcinoma, right (HCC)   10/11/2023 Procedure   Resection of cerebellar metastasis   10/11/2023 Pathology Results   Diagnosis   A: Brain tumor, biopsy - Metastatic poorly differentiated adenocarcinoma consistent with lung primary.   B: Brain tumor, right, resection - Metastatic poorly differentiated adenocarcinoma consistent with lung primary.   C: Brain tumor, right, specimen trap - Metastatic poorly differentiated adenocarcinoma consistent with lung primary.         Current Treatment:  TBD  INTERVAL HISTORY:  Julie Horn is here today for follow up.  Patient is accompanied by her long-term boyfriend today.  She is s/p surgical resection of her cerebellar metastasis today.  She is scheduled for radiation of her brain.The patient reports feeling pressure in the head, likely due to post-surgical swelling. The patient describes the pressure as uncomfortable but not painful. The patient also reports some nausea and vomiting, which is managed with medication. The patient has quit smoking and is using nicotine gum to manage cravings. The patient reports feeling generally well and is active around the house, although she tires easily.  Overall is feeling well.  I have reviewed the past medical history, past surgical history, social history and family history with the patient and they are unchanged from previous note.  ALLERGIES:  is allergic to levofloxacin, sulfa antibiotics, azithromycin, doxycycline, and latex.  MEDICATIONS:  Current Outpatient Medications  Medication Sig Dispense Refill   acetaminophen (TYLENOL) 500 MG tablet  Take 1,000 mg by mouth every 6 (six) hours as needed for mild pain (pain score 1-3). Take 2 tablets (1,000 mg total) by mouth every six (6) hours.     albuterol (VENTOLIN HFA) 108 (90 Base) MCG/ACT inhaler Inhale 2 puffs into the lungs every 4 (four) hours as needed for wheezing or shortness of breath. 6.7 g 2   ascorbic acid (VITAMIN C) 500 MG tablet Take 500 mg by mouth daily. Take 1 tablet (500 mg total) by mouth daily     blood glucose meter kit and supplies 1 each by Other route as directed. Dispense based on patient and insurance preference.  glucose blood test strip Generic drug: blood sugar diagnostic Use to check blood sugar as directed with insulin 3 times a day & for symptoms of high or low blood sugar.  lancets Misc Use to check blood sugar as directed with insulin 3 times a day & for symptoms of high or low blood sugar.     co-enzyme Q-10 30 MG capsule Take 30 mg by mouth 3 (three) times daily. Take 1 capsule (30 mg total) by mouth Three (3) times a day.     dexamethasone (DECADRON) 4 MG tablet Take 4 mg by mouth 4 (four) times daily as needed. TUESDAY IS LAST DAY     docusate sodium (COLACE) 100 MG capsule Take 100 mg by mouth 3 (three) times daily as needed for mild constipation. Take 1 capsule (100 mg total) by mouth Three (3) times a day as needed for constipation.  famotidine (PEPCID) 20 MG tablet Take 20 mg by mouth 2 (two) times daily. Take 1 tablet (20 mg total) by mouth two (2) times a day.     guaiFENesin (MUCINEX) 600 MG 12 hr tablet Take 600 mg by mouth 2 (two) times daily. Take 1 tablet (600 mg total) by mouth every twelve (12) hours.     hydrOXYzine (VISTARIL) 25 MG capsule Take 1 capsule (25 mg total) by mouth 3 (three) times daily. For anxiety and sleep. 90 capsule 5   ibuprofen (ADVIL) 100 MG tablet Take 200-400 mg by mouth every 6 (six) hours as needed for pain.     Insulin Aspart FlexPen (NOVOLOG) 100 UNIT/ML 1 Units as needed.     insulin lispro (HUMALOG) 100  UNIT/ML injection Inject 1 mL into the skin 3 (three) times daily before meals. Take 0-20 units for each meal and for blood glucose results: Blood glucose 51-70: Consume juice or crackers. 71-150: Give 0 units. 151-200: Give 1 unit. 201-250: Give 2 units. 251-300: Give 3 units. 301-350: Give 4 units. 351-400: Give 5 units.     Insulin Pen Needle (PEN NEEDLES) 32G X 4 MM MISC 1 Application by Does not apply route 3 (three) times daily. diabetic 32 gauge x 5/32" (4 mm) Ndle Use with insulin up to 3 times/day before meals     magnesium oxide (MAG-OX) 400 (240 Mg) MG tablet Take 400 mg by mouth daily. Take 1 tablet (400 mg total) by mouth daily.     melatonin 1 MG TABS tablet Take 1 mg by mouth at bedtime. Take 5 tablets (5 mg total) by mouth nightly.     methocarbamol (ROBAXIN) 500 MG tablet Take 1 tablet (500 mg total) by mouth 3 (three) times daily. (Patient taking differently: Take 250 mg by mouth every 8 (eight) hours as needed for muscle spasms.) 60 tablet 1   montelukast (SINGULAIR) 10 MG tablet Take 1 tablet (10 mg total) by mouth at bedtime. (Patient taking differently: Take 10 mg by mouth daily as needed (allergies).) 90 tablet 3   omeprazole (PRILOSEC) 20 MG capsule Take 20 mg by mouth daily.     ondansetron (ZOFRAN-ODT) 4 MG disintegrating tablet Take 1 tablet (4 mg total) by mouth every 8 (eight) hours as needed for nausea or vomiting. 30 tablet 5   oxyCODONE (ROXICODONE) 15 MG immediate release tablet Take 15 mg by mouth every 4 (four) hours as needed for pain. Take 0.5 tablets (7.5 mg total) by mouth every four (4) hours as needed for up to 7 days.     oxyCODONE-acetaminophen (PERCOCET/ROXICET) 5-325 MG tablet Take 1 tablet by mouth every 6 (six) hours as needed for severe pain (pain score 7-10). 15 tablet 0   polyethylene glycol (MIRALAX / GLYCOLAX) 17 g packet Take 17 g by mouth daily. Take 17 g by mouth daily.     senna (SENOKOT) 8.6 MG tablet Take 2 tablets by mouth daily.      sennosides-docusate sodium (SENOKOT-S) 8.6-50 MG tablet Take 2 tablets by mouth daily.     tizanidine (ZANAFLEX) 2 MG capsule Take 2 mg by mouth every 6 (six) hours as needed for muscle spasms. Take 1 tablet (2 mg total) by mouth every six (6) hours as needed (neck pain).     traZODone (DESYREL) 150 MG tablet Use from 1/3 to 1 tablet nightly as needed for sleep. (Patient taking differently: Take 75 mg by mouth at bedtime as needed for sleep. Use from 1/3 to 1 tablet  nightly as needed for sleep.) 30 tablet 5   No current facility-administered medications for this visit.    REVIEW OF SYSTEMS:   Constitutional: Denies fevers, chills or abnormal weight loss Eyes: Denies blurriness of vision Ears, nose, mouth, throat, and face: Denies mucositis or sore throat Respiratory: Denies cough, dyspnea or wheezes Cardiovascular: Denies palpitation, chest discomfort or lower extremity swelling Gastrointestinal:  Denies nausea, heartburn or change in bowel habits Skin: Denies abnormal skin rashes Lymphatics: Denies new lymphadenopathy or easy bruising Neurological:Denies numbness, tingling or new weaknesses Behavioral/Psych: Mood is stable, no new changes  All other systems were reviewed with the patient and are negative.   VITALS:  There were no vitals taken for this visit.  Wt Readings from Last 3 Encounters:  10/18/23 134 lb (60.8 kg)  10/03/23 133 lb 12.8 oz (60.7 kg)  09/27/23 128 lb (58.1 kg)    There is no height or weight on file to calculate BMI.  Performance status (ECOG): 0 - Asymptomatic  PHYSICAL EXAM:   GENERAL:alert, no distress and comfortable LYMPH:  no palpable lymphadenopathy in the cervical, axillary or inguinal LUNGS: clear to auscultation and percussion with normal breathing effort HEART: regular rate & rhythm and no murmurs and no lower extremity edema ABDOMEN:abdomen soft, non-tender and normal bowel sounds Musculoskeletal:no cyanosis of digits and no clubbing  NEURO:  alert & oriented x 3 with fluent speech.  Postsurgical scar on the right occipital region.  LABORATORY DATA:  I have reviewed the data as listed  Lab Results  Component Value Date   WBC 7.5 09/25/2023   NEUTROABS 4.3 05/22/2023   HGB 15.9 (H) 09/25/2023   HCT 46.8 (H) 09/25/2023   MCV 93.8 09/25/2023   PLT 259 09/25/2023      Chemistry      Component Value Date/Time   NA 136 09/25/2023 1927   NA 145 (H) 05/22/2023 1054   K 4.1 09/25/2023 1927   CL 103 09/25/2023 1927   CO2 23 09/25/2023 1927   BUN 9 09/25/2023 1927   BUN 7 05/22/2023 1054   CREATININE 0.63 09/25/2023 1927   GLU 83 08/25/2015 0000      Component Value Date/Time   CALCIUM 10.1 09/25/2023 1927   ALKPHOS 75 05/22/2023 1054   AST 15 05/22/2023 1054   ALT 23 05/22/2023 1054   BILITOT 0.5 05/22/2023 1054       RADIOGRAPHIC STUDIES: I have personally reviewed the radiological images as listed and agreed with the findings in the report.  NM PET Image Initial (PI) Skull Base To Thigh Result Date: 10/17/2023 CLINICAL DATA:  Initial treatment strategy for non-small cell lung cancer. EXAM: NUCLEAR MEDICINE PET SKULL BASE TO THIGH TECHNIQUE: 6.2 mCi F-18 FDG was injected intravenously. Full-ring PET imaging was performed from the skull base to thigh after the radiotracer. CT data was obtained and used for attenuation correction and anatomic localization. Fasting blood glucose: 103 mg/dl COMPARISON:  None Available. FINDINGS: Choose one NECK: No hypermetabolic lymph nodes in the neck. Incidental CT findings: None. CHEST: Spiculated nodule in the RIGHT upper lobe measures 1.3 x 2.0 cm with SUV max equal 5.7 on image 43. RIGHT lower paratracheal node measures 8 mm (image 48) with SUV max equal 3.7. RIGHT hilar node measuring 11 mm node 54 with SUV max equal 3.0. No contralateral hypermetabolic lymph nodes. No supraclavicular adenopathy. Incidental CT findings: None. ABDOMEN/PELVIS: No abnormal hypermetabolic activity within  the liver, pancreas, adrenal glands, or spleen. No hypermetabolic lymph nodes in the  abdomen or pelvis. There is intense activity through the distal aspect of the anal canal which is favored physiologic. SUV max equal 4.4 on image 145. Incidental CT findings: None. SKELETON: No focal hypermetabolic activity to suggest skeletal metastasis. Incidental CT findings: None. IMPRESSION: 1. Hypermetabolic RIGHT upper lobe lung nodule consistent primary bronchogenic carcinoma. 2. Mildly hypermetabolic RIGHT lower paratracheal and RIGHT hilar lymph nodes are concerning for nodal metastasis. No contralateral hypermetabolic lymph nodes. 3. No evidence of distant metastatic disease. 4. Intense activity through the distal anal canal is favored physiologic. Recommend digital rectal exam. Electronically Signed   By: Genevive Bi M.D.   On: 10/17/2023 14:11   CT CHEST ABDOMEN PELVIS W CONTRAST Result Date: 09/25/2023 CLINICAL DATA:  Kidney cancer staging, patient referred to ED by outpatient MRI center, vision problems, dizziness, brain mass * Tracking Code: BO * EXAM: CT CHEST, ABDOMEN, AND PELVIS WITH CONTRAST TECHNIQUE: Multidetector CT imaging of the chest, abdomen and pelvis was performed following the standard protocol during bolus administration of intravenous contrast. RADIATION DOSE REDUCTION: This exam was performed according to the departmental dose-optimization program which includes automated exposure control, adjustment of the mA and/or kV according to patient size and/or use of iterative reconstruction technique. CONTRAST:  OMNIPAQUE IOHEXOL 300 MG/ML  SOLN COMPARISON:  None Available. FINDINGS: CT CHEST FINDINGS Cardiovascular: No significant vascular findings. Normal heart size. No pericardial effusion. Mediastinum/Nodes: Enlarged right hilar lymph node measuring 1.5 x 1.2 cm. No other enlarged mediastinal, hilar, or axillary lymph nodes. Thyroid gland, trachea, and esophagus demonstrate no significant  findings. Lungs/Pleura: Moderate centrilobular emphysema. Diffuse bilateral bronchial wall thickening and background of fine centrilobular nodularity throughout the lungs. Spiculated mass in the posterior right pulmonary apex measuring 2.6 x 1.4 cm (series 3, image 42). No pleural effusion or pneumothorax. Musculoskeletal: No chest wall abnormality. No acute osseous findings. CT ABDOMEN PELVIS FINDINGS Hepatobiliary: Tiny hypodensities in the central liver, incompletely characterized (series 2, image 60, 70). No gallstones, gallbladder wall thickening, or biliary dilatation. Pancreas: Unremarkable. No pancreatic ductal dilatation or surrounding inflammatory changes. Spleen: Normal in size without significant abnormality. Adrenals/Urinary Tract: Adrenal glands are unremarkable. Kidneys are normal, without renal calculi, solid lesion, or hydronephrosis. Bladder is unremarkable. Stomach/Bowel: Stomach is within normal limits. Appendicolith adjacent to the appendiceal ostium. Appendix appears normal. No evidence of bowel wall thickening, distention, or inflammatory changes. Vascular/Lymphatic: No significant vascular findings are present. No enlarged abdominal or pelvic lymph nodes. Reproductive: No mass or other abnormality. Other: No abdominal wall hernia or abnormality. Trace free fluid in the low pelvis. Musculoskeletal: No acute osseous findings. Chronic bilateral pars defects of L5. Soft tissue nodule within the right gluteal subcutaneous fat measuring 1.1 x 0.7 cm (series 2, image 75). IMPRESSION: 1. Spiculated mass in the posterior right pulmonary apex measuring 2.6 x 1.4 cm, consistent with primary bronchogenic malignancy. 2. Enlarged right hilar lymph node measuring 1.5 x 1.2 cm, consistent with nodal metastatic disease. 3. Tiny hypodensities in the central liver, incompletely characterized, although statistically likely benign cysts or hemangiomas. Small metastases not strictly excluded. 4. Soft tissue nodule  within the right gluteal subcutaneous fat; given location most likely an injection granuloma, however subcutaneous soft tissue metastases are occasionally seen in metastatic lung malignancy. Attention on follow-up. 5. Emphysema, diffuse bilateral bronchial wall thickening, and smoking-related respiratory bronchiolitis. 6. Trace free fluid in the low pelvis, nonspecific and possibly physiologic. Emphysema (ICD10-J43.9). Electronically Signed   By: Jearld Lesch M.D.   On: 09/25/2023 21:22   MR  BRAIN W WO CONTRAST Result Date: 09/25/2023 CLINICAL DATA:  Headache, increasing frequency or severity EXAM: MRI HEAD WITHOUT AND WITH CONTRAST TECHNIQUE: Multiplanar, multiecho pulse sequences of the brain and surrounding structures were obtained without and with intravenous contrast. CONTRAST:  6.39mL GADAVIST GADOBUTROL 1 MMOL/ML IV SOLN COMPARISON:  None Available. FINDINGS: Brain: There is a heterogeneously contrast-enhancing mass of the right cerebellar hemisphere that measures 2.4 x 1.6 x 1.9 cm. There is a large amount of surrounding vasogenic edema within the right cerebellar hemisphere that causes midline shift within the posterior fossa and narrowing of the fourth ventricle. There is no hydrocephalus. The supratentorial brain is normal. Vascular: Normal flow voids. Skull and upper cervical spine: Normal marrow signal. Sinuses/Orbits: Negative. Other: None. IMPRESSION: 1. Heterogeneously contrast-enhancing mass of the right cerebellar hemisphere with a large amount of surrounding vasogenic edema. Metastasis and hemangioblastoma are the most common infratentorial tumors in adults. Astrocytoma is another possibility. 2. Mass effect on the fourth ventricle without hydrocephalus. Patient transferred to the emergency department by the MRI technologists at the time of scan acquisition. Electronically Signed   By: Deatra Robinson M.D.   On: 09/25/2023 19:02

## 2023-10-24 NOTE — Assessment & Plan Note (Signed)
 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  S/p cerebellar lesion resection consistent with lung primary.  Oncology history as below. -PET scan showed limited disease to lung and lymph nodes.  Discussed at tumor board, surgery is not a feasible option secondary to lymph node involvement.  Recommended chemoradiation. -Discussed briefly the risk versus benefits of chemoradiation followed by immunotherapy for a year.  Briefly discussed the agents used and common side effects.Chemotherapy, using a platinum-based agent, aims to enhance radiation sensitivity. Radiation side effects include numbness, tingling, nausea, immunosuppression, fatigue, and tiredness.  Will schedule for chemo education. -Discussed the risk versus benefits of port placement and its role in administering chemotherapy.  Will refer to IR for port placement. -Patient is planned to get brain radiation.  Will plan to start chemo RT to the lung after that.  Will coordinate care with [facility]. -Caris testing on pathology pending at this time. -Nutrition referral. -Will obtain a baseline CT scan before the start of chemo RT.  Return to clinic before the first cycle of chemo RT.

## 2023-10-25 ENCOUNTER — Other Ambulatory Visit: Payer: Self-pay

## 2023-10-25 NOTE — Patient Instructions (Signed)
 [facility] Chemotherapy Teaching    You have been diagnosed with lung cancer by your oncologist.  You will receive the chemotherapy called Taxol and Carboplatin.  You will receive this treatment every week.  The intent of treatment is curative.   You will see the doctor regularly throughout treatment.  We will obtain blood work from you prior to every treatment and monitor your results to make sure it is safe to give your treatment. The doctor monitors your response to treatment by the way you are feeling, your blood work, and by obtaining scans periodically.  There will be wait times while you are here for treatment.  It will take about 30 minutes to 1 hour for your lab work to result.  Then there will be wait times while pharmacy mixes your medications.    Medications you will receive in the clinic prior to your chemotherapy medications:   Aloxi:  ALOXI is used in adults to help prevent the nausea and vomiting that happens with certain chemotherapy drugs.  Aloxi is a long acting medication, and will remain in your system for about 2 days.    Dexamethasone:  This is a steroid given prior to chemotherapy to help prevent allergic reactions; it may also help prevent and control nausea and diarrhea.    Pepcid:  This medication is a histamine blocker that helps prevent and allergic reaction to your chemotherapy.    Benadryl:  This is a histamine blocker (different from the Pepcid) that helps prevent allergic/infusion reactions to your chemotherapy. This medication may cause dizziness/drowsiness.    Paclitaxel (Taxol)  About This Drug Paclitaxel is a drug used to treat cancer. It is given in the vein (IV).  This will take 1 hour to infuse.  This first infusion will take longer to infuse because it is increased slowly to monitor for reactions.  The nurse will be in the room with you for the first 15 minutes of the first infusion.  Possible Side Effects   Hair loss. Hair loss is  often temporary, although with certain medicine, hair loss can sometimes be permanent. Hair loss may happen suddenly or gradually. If you lose hair, you may lose it from your head, face, armpits, pubic area, chest, and/or legs. You may also notice your hair getting thin.   Swelling of your legs, ankles and/or feet (edema)   Flushing   Nausea and throwing up (vomiting)   Loose bowel movements (diarrhea)   Bone marrow depression. This is a decrease in the number of white blood cells, red blood cells, and platelets. This may raise your risk of infection, make you tired and weak (fatigue), and raise your risk of bleeding.   Effects on the nerves are called peripheral neuropathy. You may feel numbness, tingling, or pain in your hands and feet. It may be hard for you to button your clothes, open jars, or walk as usual. The effect on the nerves may get worse with more doses of the drug. These effects get better in some people after the drug is stopped but it does not get better in all people.   Changes in your liver function   Bone, joint and muscle pain   Abnormal EKG   Allergic reaction: Allergic reactions, including anaphylaxis are rare but may happen in some patients. Signs of allergic reaction to this drug may be swelling of the face, feeling like your tongue or throat are swelling, trouble breathing, rash, itching, fever, chills, feeling dizzy, and/or feeling  that your heart is beating in a fast or not normal way. If this happens, do not take another dose of this drug. You should get urgent medical treatment.   Infection   Changes in your kidney function.  Note: Each of the side effects above was reported in 20% or greater of patients treated with paclitaxel. Not all possible side effects are included above.  Warnings and Precautions   Severe allergic reactions   Severe bone marrow depression  Treating Side Effects   To help with hair loss, wash with a mild shampoo and avoid  washing your hair every day.   Avoid rubbing your scalp, instead, pat your hair or scalp dry   Avoid coloring your hair   Limit your use of hair spray, electric curlers, blow dryers, and curling irons.   If you are interested in getting a wig, talk to your nurse. You can also call the [employer] at 800-ACS-2345 to find out information about the "Look Good, Feel Better" program close to where you live. It is a free program where women getting chemotherapy can learn about wigs, turbans and scarves as well as makeup techniques and skin and nail care.   Ask your doctor or nurse about medicines that are available to help stop or lessen diarrhea and/or nausea.   To help with nausea and vomiting, eat small, frequent meals instead of three large meals a day. Choose foods and drinks that are at room temperature. Ask your nurse or doctor about other helpful tips and medicine that is available to help or stop lessen these symptoms.   If you get diarrhea, eat low-fiber foods that are high in protein and calories and avoid foods that can irritate your digestive tracts or lead to cramping. Ask your nurse or doctor about medicine that can lessen or stop your diarrhea.   Mouth care is very important. Your mouth care should consist of routine, gentle cleaning of your teeth or dentures and rinsing your mouth with a mixture of 1/2 teaspoon of salt in 8 ounces of water or  teaspoon of baking soda in 8 ounces of water. This should be done at least after each meal and at bedtime.   If you have mouth sores, avoid mouthwash that has alcohol. Also avoid alcohol and smoking because they can bother your mouth and throat.   Drink plenty of fluids (a minimum of eight glasses per day is recommended).   Take your temperature as your doctor or nurse tells you, and whenever you feel like you may have a fever.   Talk to your doctor or nurse about precautions you can take to avoid infections and bleeding.   Be  careful when cooking, walking, and handling sharp objects and hot liquids.  Food and Drug Interactions   There are no known interactions of paclitaxel with food.   This drug may interact with other medicines. Tell your doctor and pharmacist about all the medicines and dietary supplements (vitamins, minerals, herbs and others) that you are taking at this time.   The safety and use of dietary supplements and alternative diets are often not known. Using these might affect your cancer or interfere with your treatment. Until more is known, you should not use dietary supplements or alternative diets without your cancer doctor's help.  When to Call the Doctor  Call your doctor or nurse if you have any of the following symptoms and/or any new or unusual symptoms:   Fever of 100.4 F (38  C) or above   Chills   Redness, pain, warmth, or swelling at the IV site during the infusion   Signs of allergic reaction: swelling of the face, feeling like your tongue or throat are swelling, trouble breathing, rash, itching, fever, chills, feeling dizzy, and/or feeling that your heart is beating in a fast or not normal way   Feeling that your heart is beating in a fast or not normal way (palpitations)   Weight gain of 5 pounds in one week (fluid retention)   Decreased urine or very dark urine   Signs of liver problems: dark urine, pale bowel movements, bad stomach pain, feeling very tired and weak, unusual  itching, or yellowing of the eyes or skin   Heavy menstrual period that lasts longer than normal   Easy bruising or bleeding   Nausea that stops you from eating or drinking, and/or that is not relieved by prescribed medicines.   Loose bowel movements (diarrhea) more than 4 times a day or diarrhea with weakness or lightheadedness   Pain in your mouth or throat that makes it hard to eat or drink   Lasting loss of appetite or rapid weight loss of five pounds in a week   Signs of peripheral  neuropathy: numbness, tingling, or decreased feeling in fingers or toes; trouble walking or changes in the way you walk; or feeling clumsy when buttoning clothes, opening jars, or other routine activities   Joint and muscle pain that is not relieved by prescribed medicines   Extreme fatigue that interferes with normal activities   While you are getting this drug, please tell your nurse right away if you have any pain, redness, or swelling at the site of the IV infusion.   If you think you are pregnant.  Reproduction Warnings   Pregnancy warning: This drug may have harmful effects on the unborn child, it is recommended that effective methods of birth control should be used during your cancer treatment. Let your doctor know right away if you think you may be pregnant.   Breast feeding warning: Women should not breast feed during treatment because this drug could enter the breastmilk and cause harm to a breast feeding baby.   Carboplatin (Paraplatin, CBDCA)  About This Drug  Carboplatin is used to treat cancer. It is given in the vein (IV).  It will take 30 minutes to infuse.   Possible Side Effects   Bone marrow suppression. This is a decrease in the number of white blood cells, red blood cells, and platelets. This may raise your risk of infection, make you tired and weak (fatigue), and raise your risk of bleeding.   Nausea and vomiting (throwing up)   Weakness   Changes in your liver function   Changes in your kidney function   Electrolyte changes   Pain  Note: Each of the side effects above was reported in 20% or greater of patients treated with carboplatin. Not all possible side effects are included above.   Warnings and Precautions   Severe bone marrow suppression   Allergic reactions, including anaphylaxis are rare but may happen in some patients. Signs of allergic reaction to this drug may be swelling of the face, feeling like your tongue or throat are swelling,  trouble breathing, rash, itching, fever, chills, feeling dizzy, and/or feeling that your heart is beating in a fast or not normal way. If this happens, do not take another dose of this drug. You should get urgent medical treatment.  Severe nausea and vomiting   Effects on the nerves are called peripheral neuropathy. This risk is increased if you are over the age of 46 or if you have received other medicine with risk of peripheral neuropathy. You may feel numbness, tingling, or pain in your hands and feet. It may be hard for you to button your clothes, open jars, or walk as usual. The effect on the nerves may get worse with more doses of the drug. These effects get better in some people after the drug is stopped but it does not get better in all people.   Blurred vision, loss of vision or other changes in eyesight   Decreased hearing   - Skin and tissue irritation including redness, pain, warmth, or swelling at the IV site if the drug leaks out of the vein and into nearby tissue.   Severe changes in your kidney function, which can cause kidney failure   Severe changes in your liver function, which can cause liver failure  Note: Some of the side effects above are very rare. If you have concerns and/or questions, please discuss them with your medical team.   Important Information   This drug may be present in the saliva, tears, sweat, urine, stool, vomit, semen, and vaginal secretions. Talk to your doctor and/or your nurse about the necessary precautions to take during this time.   Treating Side Effects   Manage tiredness by pacing your activities for the day.   Be sure to include periods of rest between energy-draining activities.   To decrease the risk of infection, wash your hands regularly.   Avoid close contact with people who have a cold, the flu, or other infections.   Take your temperature as your doctor or nurse tells you, and whenever you feel like you may have a fever.    To help decrease the risk of bleeding, use a soft toothbrush. Check with your nurse before using dental floss.   Be very careful when using knives or tools.   Use an electric shaver instead of a razor.   Drink plenty of fluids (a minimum of eight glasses per day is recommended).   If you throw up or have loose bowel movements, you should drink more fluids so that you do not become dehydrated (lack of water in the body from losing too much fluid).   To help with nausea and vomiting, eat small, frequent meals instead of three large meals a day. Choose foods and drinks that are at room temperature. Ask your nurse or doctor about other helpful tips and medicine that is available to help stop or lessen these symptoms.   If you have numbness and tingling in your hands and feet, be careful when cooking, walking, and handling sharp objects and hot liquids.   Keeping your pain under control is important to your well-being. Please tell your doctor or nurse if you are experiencing pain.   Food and Drug Interactions   There are no known interactions of carboplatin with food.   This drug may interact with other medicines. Tell your doctor and pharmacist about all the prescription and over-the-counter medicines and dietary supplements (vitamins, minerals, herbs and others) that you are taking at this time. Also, check with your doctor or pharmacist before starting any new prescription or over-the-counter medicines, or dietary supplements to make sure that there are no interactions.   When to Call the Doctor  Call your doctor or nurse if you have any of these symptoms  and/or any new or unusual symptoms:   Fever of 100.4 F (38 C) or higher   Chills   Tiredness that interferes with your daily activities   Feeling dizzy or lightheaded   Easy bleeding or bruising   Nausea that stops you from eating or drinking and/or is not relieved by prescribed medicines   Throwing up   Blurred vision or  other changes in eyesight   Decrease in hearing or ringing in the ear   Signs of allergic reaction: swelling of the face, feeling like your tongue or throat are swelling, trouble breathing, rash, itching, fever, chills, feeling dizzy, and/or feeling that your heart is beating in a fast or not normal way. If this happens, call 911 for emergency care.   Signs of possible liver problems: dark urine, pale bowel movements, bad stomach pain, feeling very tired and weak, unusual itching, or yellowing of the eyes or skin   Decreased urine, or very dark urine   Numbness, tingling, or pain in your hands and feet   Pain that does not go away or is not relieved by prescribed medicine   While you are getting this drug, please tell your nurse right away if you have any pain, redness, or swelling at the site of the IV infusion, or if you have any new onset of symptoms, or if you just feel "different" from before when the infusion was started.   Reproduction Warnings   Pregnancy warning: This drug may have harmful effects on the unborn baby. Women of child bearing potential should use effective methods of birth control during your cancer treatment. Let your doctor know right away if you think you may be pregnant.   Breastfeeding warning: It is not known if this drug passes into breast milk. For this reason, women should not breastfeed during treatment because this drug could enter the breast milk and cause harm to a breastfeeding baby.   Fertility warning: Human fertility studies have not been done with this drug. Talk with your doctor or nurse if you plan to have children. Ask for information on sperm or egg banking.   SELF CARE ACTIVITIES WHILE RECEIVING CHEMOTHERAPY:  Hydration Increase your fluid intake 48 hours prior to treatment and drink at least 8 to 12 cups (64 ounces) of water/decaffeinated beverages per day after treatment. You can still have your cup of coffee or soda but these beverages  do not count as part of your 8 to 12 cups that you need to drink daily. No alcohol intake.  Medications Continue taking your normal prescription medication as prescribed.  If you start any new herbal or new supplements please let us know first to make sure it is safe.  Mouth Care Have teeth cleaned professionally before starting treatment. Keep dentures and partial plates clean. Use soft toothbrush and do not use mouthwashes that contain alcohol. Biotene is a good mouthwash that is available at most pharmacies or may be ordered by calling [phone]. Use warm salt water gargles (1 teaspoon salt per 1 quart warm water) before and after meals and at bedtime. If you need dental work, please let the doctor know before you go for your appointment so that we can coordinate the best possible time for you in regards to your chemo regimen. You need to also let your dentist know that you are actively taking chemo. We may need to do labs prior to your dental appointment.  Skin Care Always use sunscreen that has not expired and with  SPF (Sun Protection Factor) of 50 or higher. Wear hats to protect your head from the sun. Remember to use sunscreen on your hands, ears, face, & feet.  Use good moisturizing lotions such as udder cream, eucerin, or even Vaseline. Some chemotherapies can cause dry skin, color changes in your skin and nails.    Avoid long, hot showers or baths. Use gentle, fragrance-free soaps and laundry detergent. Use moisturizers, preferably creams or ointments rather than lotions because the thicker consistency is better at preventing skin dehydration. Apply the cream or ointment within 15 minutes of showering. Reapply moisturizer at night, and moisturize your hands every time after you wash them.  Hair Loss (if your doctor says your hair will fall out)  If your doctor says that your hair is likely to fall out, decide before you begin chemo whether you want to wear a wig. You may want to shop  before treatment to match your hair color. Hats, turbans, and scarves can also camouflage hair loss, although some people prefer to leave their heads uncovered. If you go bare-headed outdoors, be sure to use sunscreen on your scalp. Cut your hair short. It eases the inconvenience of shedding lots of hair, but it also can reduce the emotional impact of watching your hair fall out. Don't perm or color your hair during chemotherapy. Those chemical treatments are already damaging to hair and can enhance hair loss. Once your chemo treatments are done and your hair has grown back, it's OK to resume dyeing or perming hair.  With chemotherapy, hair loss is almost always temporary. But when it grows back, it may be a different color or texture. In older adults who still had hair color before chemotherapy, the new growth may be completely gray.  Often, new hair is very fine and soft.  Infection Prevention Please wash your hands for at least 30 seconds using warm soapy water. Handwashing is the #1 way to prevent the spread of germs. Stay away from sick people or people who are getting over a cold. If you develop respiratory systems such as green/yellow mucus production or productive cough or persistent cough let us know and we will see if you need an antibiotic. It is a good idea to keep a pair of gloves on when going into grocery stores/[employer] to decrease your risk of coming into contact with germs on the carts, etc. Carry alcohol hand gel with you at all times and use it frequently if out in public. If your temperature reaches 100.4 or higher please call the clinic and let us know.  If it is after hours or on the weekend please go to the ER if your temperature is over 100.4.  Please have your own personal thermometer at home to use.    Sex and bodily fluids If you are going to have sex, a condom must be used to protect the person that isn't taking chemotherapy. Chemo can decrease your libido (sex drive). For a  few days after chemotherapy, chemotherapy can be excreted through your bodily fluids.  When using the toilet please close the lid and flush the toilet twice.  Do this for a few day after you have had chemotherapy.   Effects of chemotherapy on your sex life Some changes are simple and won't last long. They won't affect your sex life permanently.  Sometimes you may feel: too tired not strong enough to be very active sick or sore  not in the mood anxious or low  Your anxiety  might not seem related to sex. For example, you may be worried about the cancer and how your treatment is going. Or you may be worried about money, or about how you family are coping with your illness.  These things can cause stress, which can affect your interest in sex. It's important to talk to your partner about how you feel.  Remember - the changes to your sex life don't usually last long. There's usually no medical reason to stop having sex during chemo. The drugs won't have any long term physical effects on your performance or enjoyment of sex. Cancer can't be passed on to your partner during sex  Contraception It's important to use reliable contraception during treatment. Avoid getting pregnant while you or your partner are having chemotherapy. This is because the drugs may harm the baby. Sometimes chemotherapy drugs can leave a man or woman infertile.  This means you would not be able to have children in the future. You might want to talk to someone about permanent infertility. It can be very difficult to learn that you may no longer be able to have children. Some people find counselling helpful. There might be ways to preserve your fertility, although this is easier for men than for women. You may want to speak to a fertility expert. You can talk about sperm banking or harvesting your eggs. You can also ask about other fertility options, such as donor eggs. If you have or have had breast cancer, your doctor might advise you  not to take the contraceptive pill. This is because the hormones in it might affect the cancer. It is not known for sure whether or not chemotherapy drugs can be passed on through semen or secretions from the vagina. Because of this some doctors advise people to use a barrier method if you have sex during treatment. This applies to vaginal, anal or oral sex. Generally, doctors advise a barrier method only for the time you are actually having the treatment and for about a week after your treatment. Advice like this can be worrying, but this does not mean that you have to avoid being intimate with your partner. You can still have close contact with your partner and continue to enjoy sex.  Animals If you have cats or birds we just ask that you not change the litter or change the cage.  Please have someone else do this for you while you are on chemotherapy.   Food Safety During and After Cancer Treatment Food safety is important for people both during and after cancer treatment. Cancer and cancer treatments, such as chemotherapy, radiation therapy, and stem cell/bone marrow transplantation, often weaken the immune system. This makes it harder for your body to protect itself from foodborne illness, also called food poisoning. Foodborne illness is caused by eating food that contains harmful bacteria, parasites, or viruses.  Foods to avoid Some foods have a higher risk of becoming tainted with bacteria. These include: Unwashed fresh fruit and vegetables, especially leafy vegetables that can hide dirt and other contaminants Raw sprouts, such as alfalfa sprouts Raw or undercooked beef, especially ground beef, or other raw or undercooked meat and poultry Fatty, fried, or spicy foods immediately before or after treatment.  These can sit heavy on your stomach and make you feel nauseous. Raw or undercooked shellfish, such as oysters. Sushi and sashimi, which often contain raw fish.  Unpasteurized beverages, such  as unpasteurized fruit juices, raw milk, raw yogurt, or cider Undercooked eggs, such as  soft boiled, over easy, and poached; raw, unpasteurized eggs; or foods made with raw egg, such as homemade raw cookie dough and homemade mayonnaise  Simple steps for food safety  Shop smart. Do not buy food stored or displayed in an unclean area. Do not buy bruised or damaged fruits or vegetables. Do not buy cans that have cracks, dents, or bulges. Pick up foods that can spoil at the end of your shopping trip and store them in a cooler on the way home.  Prepare and clean up foods carefully. Rinse all fresh fruits and vegetables under running water, and dry them with a clean towel or paper towel. Clean the top of cans before opening them. After preparing food, wash your hands for 20 seconds with hot water and soap. Pay special attention to areas between fingers and under nails. Clean your utensils and dishes with hot water and soap. Disinfect your kitchen and cutting boards using 1 teaspoon of liquid, unscented bleach mixed into 1 quart of water.    Dispose of old food. Eat canned and packaged food before its expiration date (the "use by" or "best before" date). Consume refrigerated leftovers within 3 to 4 days. After that time, throw out the food. Even if the food does not smell or look spoiled, it still may be unsafe. Some bacteria, such as Listeria, can grow even on foods stored in the refrigerator if they are kept for too long.  Take precautions when eating out. At restaurants, avoid buffets and salad bars where food sits out for a long time and comes in contact with many people. Food can become contaminated when someone with a virus, often a norovirus, or another "bug" handles it. Put any leftover food in a "to-go" container yourself, rather than having the server do it. And, refrigerate leftovers as soon as you get home. Choose restaurants that are clean and that are willing to prepare your food as  you order it cooked.   AT HOME MEDICATIONS:                                                                                                                                                                Compazine/Prochlorperazine 10mg  tablet. Take 1 tablet every 6 hours as needed for nausea/vomiting. (This can make you sleepy)   EMLA cream. Apply a quarter size amount to port site 1 hour prior to chemo. Do not rub in. Cover with plastic wrap.    Diarrhea Sheet   If you are having loose stools/diarrhea, please purchase Imodium and begin taking as outlined:  At the first sign of poorly formed or loose stools you should begin taking Imodium (loperamide) 2 mg capsules.  Take two tablets (4mg ) followed by one tablet (2mg ) every 2 hours - DO NOT  EXCEED 8 tablets in 24 hours.  If it is bedtime and you are having loose stools, take 2 tablets at bedtime, then 2 tablets every 4 hours until morning.   Always call the Cancer Center if you are having loose stools/diarrhea that you can't get under control.  Loose stools/diarrhea leads to dehydration (loss of water) in your body.  We have other options of trying to get the loose stools/diarrhea to stop but you must let us know!   Constipation Sheet  Colace - 100 mg capsules - take 2 capsules daily.  If this doesn't help then you can increase to 2 capsules twice daily.  Please call if the above does not work for you. Do not go more than 2 days without a bowel movement.  It is very important that you do not become constipated.  It will make you feel sick to your stomach (nausea) and can cause abdominal pain and vomiting.  Nausea Sheet   Compazine/Prochlorperazine 10mg  tablet. Take 1 tablet every 6 hours as needed for nausea/vomiting (This can make you drowsy).  If you are having persistent nausea (nausea that does not stop) please call the Cancer Center and let us know the amount of nausea that you are experiencing.  If you begin to vomit, you need to  call the Cancer Center and if it is the weekend and you have vomited more than one time and can't get it to stop-go to the Emergency Room.  Persistent nausea/vomiting can lead to dehydration (loss of fluid in your body) and will make you feel very weak and unwell. Ice chips, sips of clear liquids, foods that are at room temperature, crackers, and toast tend to be better tolerated.   SYMPTOMS TO REPORT AS SOON AS POSSIBLE AFTER TREATMENT:  FEVER GREATER THAN 100.4 F  CHILLS WITH OR WITHOUT FEVER  NAUSEA AND VOMITING THAT IS NOT CONTROLLED WITH YOUR NAUSEA MEDICATION  UNUSUAL SHORTNESS OF BREATH  UNUSUAL BRUISING OR BLEEDING  TENDERNESS IN MOUTH AND THROAT WITH OR WITHOUT PRESENCE OF ULCERS  URINARY PROBLEMS  BOWEL PROBLEMS  UNUSUAL RASH      Wear comfortable clothing and clothing appropriate for easy access to any Portacath or PICC line. Let us know if there is anything that we can do to make your therapy better!    What to do if you need assistance after hours or on the weekends: CALL [phone].  HOLD on the line, do not hang up.  You will hear multiple messages but at the end you will be connected with a nurse triage line.  They will contact the doctor if necessary.  Most of the time they will be able to assist you.  Do not call the hospital operator.      I have been informed and understand all of the instructions given to me and have received a copy. I have been instructed to call the clinic [phone] or my family physician as soon as possible for continued medical care, if indicated. I do not have any more questions at this time but understand that I may call the Cancer Center or the Patient Navigator at [phone] during office hours should I have questions or need assistance in obtaining follow-up care.

## 2023-10-26 ENCOUNTER — Ambulatory Visit (HOSPITAL_COMMUNITY)
Admission: RE | Admit: 2023-10-26 | Discharge: 2023-10-26 | Disposition: A | Discharge status: Home / Self Care | Source: Ambulatory Visit | Attending: Radiation Oncology | Admitting: Radiation Oncology

## 2023-10-26 ENCOUNTER — Encounter: Admitting: Dietician

## 2023-10-26 DIAGNOSIS — C7931 Secondary malignant neoplasm of brain: Secondary | ICD-10-CM | POA: Diagnosis not present

## 2023-10-26 DIAGNOSIS — G936 Cerebral edema: Secondary | ICD-10-CM | POA: Diagnosis not present

## 2023-10-26 DIAGNOSIS — G939 Disorder of brain, unspecified: Secondary | ICD-10-CM | POA: Insufficient documentation

## 2023-10-26 DIAGNOSIS — C3490 Malignant neoplasm of unspecified part of unspecified bronchus or lung: Secondary | ICD-10-CM | POA: Diagnosis not present

## 2023-10-26 DIAGNOSIS — R220 Localized swelling, mass and lump, head: Secondary | ICD-10-CM | POA: Diagnosis not present

## 2023-10-26 DIAGNOSIS — C3491 Malignant neoplasm of unspecified part of right bronchus or lung: Secondary | ICD-10-CM | POA: Diagnosis not present

## 2023-10-26 MED ORDER — GADOBUTROL 1 MMOL/ML IV SOLN
6.0000 mL | Freq: Once | INTRAVENOUS | Status: AC | PRN
  Administered 2023-10-26: 6 mL via INTRAVENOUS

## 2023-10-27 ENCOUNTER — Other Ambulatory Visit: Payer: Self-pay | Admitting: *Deleted

## 2023-10-27 ENCOUNTER — Encounter: Payer: Self-pay | Admitting: *Deleted

## 2023-10-27 DIAGNOSIS — C7931 Secondary malignant neoplasm of brain: Secondary | ICD-10-CM | POA: Diagnosis not present

## 2023-10-27 NOTE — Patient Instructions (Signed)
 Visit Information  Thank you for taking time to visit with me today. Please don't hesitate to contact me if I can be of assistance to you before our next scheduled telephone appointment.  Following are the goals we discussed today:   Goals Addressed             This Visit's Progress    TOC Care Plan       Current Barriers:  Medication management Several medication changes  Recent brain Surgery Patient reports she saw primary care provider 10/18/23, oncologist 10/24/23, will be having port placed and start radiation and chemotherapy, pt is to attend chemo class on 10/30/23 and will be seeing nutritionist,  pt states she is sleeping better since finishing steroids, eating well most days, continues having intermittent nausea with zofran providing relief.  RNCM Clinical Goal(s):  Patient will work with the Care Management team over the next 30 days to address Transition of Care Barriers: Medication Management Support at home Provider appointments take all medications exactly as prescribed and will call provider for medication related questions as evidenced by no missed medication doses attend all scheduled medical appointments:  as evidenced by no missed appointments or testing  continue to work with RN Care Manager to address care management and care coordination needs  demonstrate ongoing self health care management ability  as evidenced by increased independence and returning to baseline through collaboration with RN Care manager, provider, and care team.   Interventions: Evaluation of current treatment plan related to  self management and patient's adherence to plan as established by provider  Transitions of Care:  New goal. Sick Day Rules Reviewed Doctor Visits  - discussed the importance of doctor visits Post discharge activity limitations prescribed by provider reviewed Post-op wound/incision care reviewed with patient/caregiver Reviewed Signs and symptoms of infection Reinforced  good sleep hygiene Reinforced diet and importance of eating healthy, limiting/ avoiding sugary foods Reviewed all upcoming scheduled appointments Reviewed importance of staying well hydrated  Patient Goals/Self-Care Activities: Participate in Transition of Care Program/Attend TOC scheduled calls Take all medications as prescribed Attend all scheduled provider appointments Call pharmacy for medication refills 3-7 days in advance of running out of medications Attend church or other social activities Perform all self care activities independently  Perform IADL's (shopping, preparing meals, housekeeping, managing finances) independently Call provider office for new concerns or questions  Try to have a consistent night time routine, warm bath, cool, dark room, white noise Keep stress to a minimum Stay well hydrated  Follow Up Plan:  Telephone follow up appointment with care management team member scheduled for: 11/01/23 @ 1015 am The patient has been provided with contact information for the care management team and has been advised to call with any health related questions or concerns.           Our next appointment is by telephone on 11/01/23 at 1015 am  Please call the care guide team at [phone] if you need to cancel or reschedule your appointment.   If you are experiencing a Mental Health or Behavioral Health Crisis or need someone to talk to, please call the Suicide and Crisis Lifeline: 988 call the [place] National Suicide Prevention Lifeline: (605)563-0214 or TTY: 726-799-6109 TTY 6306396548) to talk to a trained counselor call 1-800-273-TALK (toll free, 24 hour hotline) go to [facility] [address] [place] [phone]) call the [facility] Crisis Line: [phone]) [phone] call 911   Patient verbalizes understanding of instructions and care plan  provided today and agrees to view in MyChart. Active MyChart status and patient  understanding of how to access instructions and care plan via MyChart confirmed with patient.     Irving Shows [facility], BSN RN Care Manager/ Transition of Care [place]/ [facility] [phone]

## 2023-10-27 NOTE — Patient Outreach (Signed)
 Care Management  Transitions of Care Program Transitions of Care Post-discharge week 3   10/27/2023 Name: Julie Horn MRN: [id number] DOB: Sep 06, 1975  Subjective: Julie Horn is a 48 y.o. year old female who is a primary care patient of Stacks, Broadus John, MD. The Care Management team Engaged with patient Engaged with patient by telephone to assess and address transitions of care needs.   Consent to Services:  Patient was given information about care management services, agreed to services, and gave verbal consent to participate.   Assessment: Patient saw oncologist 10/24/23, will be starting chemotherapy and radiation soon, having port placed, pt is sleeping better, continues zofran for nausea.         SDOH Interventions    Flowsheet Row Telephone from 10/13/2023 in [facility] Office Visit from 07/17/2023 in [facility] Office Visit from 05/22/2023 in [facility] Office Visit from 01/04/2022 in [facility]  SDOH Interventions      Food Insecurity Interventions Intervention Not Indicated -- -- --  Housing Interventions Intervention Not Indicated -- -- --  Transportation Interventions Intervention Not Indicated, Patient Resources (Friends/Family), Payor Benefit -- -- --  Utilities Interventions Intervention Not Indicated -- -- --  Depression Interventions/Treatment  -- PHQ2-9 Score <4 Follow-up Not Indicated Counseling PHQ2-9 Score <4 Follow-up Not Indicated        Goals Addressed             This Visit's Progress    TOC Care Plan       Current Barriers:  Medication management Several medication changes  Recent brain Surgery Patient reports she saw primary care provider 10/18/23, oncologist 10/24/23, will be having port placed and start radiation and chemotherapy, pt is to attend chemo class on 10/30/23 and will be seeing nutritionist,  pt  states she is sleeping better since finishing steroids, eating well most days, continues having intermittent nausea with zofran providing relief.  RNCM Clinical Goal(s):  Patient will work with the Care Management team over the next 30 days to address Transition of Care Barriers: Medication Management Support at home Provider appointments take all medications exactly as prescribed and will call provider for medication related questions as evidenced by no missed medication doses attend all scheduled medical appointments:  as evidenced by no missed appointments or testing  continue to work with RN Care Manager to address care management and care coordination needs  demonstrate ongoing self health care management ability  as evidenced by increased independence and returning to baseline through collaboration with RN Care manager, provider, and care team.   Interventions: Evaluation of current treatment plan related to  self management and patient's adherence to plan as established by provider  Transitions of Care:  New goal. Sick Day Rules Reviewed Doctor Visits  - discussed the importance of doctor visits Post discharge activity limitations prescribed by provider reviewed Post-op wound/incision care reviewed with patient/caregiver Reviewed Signs and symptoms of infection Reinforced good sleep hygiene Reinforced diet and importance of eating healthy, limiting/ avoiding sugary foods Reviewed all upcoming scheduled appointments Reviewed importance of staying well hydrated  Patient Goals/Self-Care Activities: Participate in Transition of Care Program/Attend TOC scheduled calls Take all medications as prescribed Attend all scheduled provider appointments Call pharmacy for medication refills 3-7 days in advance of running out of medications Attend church or other social activities Perform all self care activities independently  Perform IADL's (shopping, preparing meals, housekeeping, managing  finances)  independently Call provider office for new concerns or questions  Try to have a consistent night time routine, warm bath, cool, dark room, white noise Keep stress to a minimum Stay well hydrated  Follow Up Plan:  Telephone follow up appointment with care management team member scheduled for: 11/01/23 @ 1015 am The patient has been provided with contact information for the care management team and has been advised to call with any health related questions or concerns.          Plan: Telephone follow up appointment with care management team member scheduled for: 11/01/23 @ 1015 am  Irving Shows [facility], BSN RN Care Manager/ Transition of Care [place]/ [facility] [phone]

## 2023-10-30 ENCOUNTER — Inpatient Hospital Stay: Admitting: Family Medicine

## 2023-10-30 ENCOUNTER — Inpatient Hospital Stay

## 2023-10-30 DIAGNOSIS — C7801 Secondary malignant neoplasm of right lung: Secondary | ICD-10-CM

## 2023-10-30 MED ORDER — LIDOCAINE-PRILOCAINE 2.5-2.5 % EX CREA: TOPICAL_CREAM | CUTANEOUS | 3 refills | Status: DC

## 2023-10-30 MED ORDER — ONDANSETRON HCL 8 MG PO TABS: 8.0000 mg | ORAL_TABLET | Freq: Three times a day (TID) | ORAL | 1 refills | Status: DC | PRN

## 2023-10-30 MED ORDER — DEXAMETHASONE 4 MG PO TABS: ORAL_TABLET | ORAL | 1 refills | Status: DC

## 2023-10-30 MED ORDER — PROCHLORPERAZINE MALEATE 10 MG PO TABS: 10.0000 mg | ORAL_TABLET | Freq: Four times a day (QID) | ORAL | 1 refills | Status: DC | PRN

## 2023-10-30 NOTE — Progress Notes (Signed)
 Pharmacist Chemotherapy Monitoring - Initial Assessment    Anticipated start date: unknown yet   The following has been reviewed per standard work regarding the patient's treatment regimen: The patient's diagnosis, treatment plan and drug doses, and organ/hematologic function Lab orders and baseline tests specific to treatment regimen  The treatment plan start date, drug sequencing, and pre-medications Prior authorization status  Patient's documented medication list, including drug-drug interaction screen and prescriptions for anti-emetics and supportive care specific to the treatment regimen The drug concentrations, fluid compatibility, administration routes, and timing of the medications to be used The patient's access for treatment and lifetime cumulative dose history, if applicable  The patient's medication allergies and previous infusion related reactions, if applicable   Changes made to treatment plan:  pre-medications cetirizine 10 mg IV  Discontinue diphenhydramine from oncology treatment plan --> Add Quzyttir (cetirizine) 10 mg IVPush x 1 as premedication for oncology treatment plan.  T.O. Dr Lysle Morales, PharmD   Follow up needed:  N/A   Julie Horn, [facility], 10/30/2023  11:20 AM

## 2023-10-30 NOTE — Progress Notes (Signed)

## 2023-10-31 DIAGNOSIS — I7300 Raynaud's syndrome without gangrene: Secondary | ICD-10-CM | POA: Diagnosis not present

## 2023-10-31 DIAGNOSIS — J439 Emphysema, unspecified: Secondary | ICD-10-CM | POA: Diagnosis not present

## 2023-10-31 DIAGNOSIS — J45909 Unspecified asthma, uncomplicated: Secondary | ICD-10-CM | POA: Diagnosis not present

## 2023-10-31 DIAGNOSIS — Z87891 Personal history of nicotine dependence: Secondary | ICD-10-CM | POA: Diagnosis not present

## 2023-10-31 DIAGNOSIS — G936 Cerebral edema: Secondary | ICD-10-CM | POA: Diagnosis not present

## 2023-10-31 DIAGNOSIS — F419 Anxiety disorder, unspecified: Secondary | ICD-10-CM | POA: Diagnosis not present

## 2023-10-31 DIAGNOSIS — F32A Depression, unspecified: Secondary | ICD-10-CM | POA: Diagnosis not present

## 2023-10-31 DIAGNOSIS — Z3202 Encounter for pregnancy test, result negative: Secondary | ICD-10-CM | POA: Diagnosis not present

## 2023-10-31 DIAGNOSIS — K219 Gastro-esophageal reflux disease without esophagitis: Secondary | ICD-10-CM | POA: Diagnosis not present

## 2023-10-31 DIAGNOSIS — C7931 Secondary malignant neoplasm of brain: Secondary | ICD-10-CM | POA: Diagnosis not present

## 2023-10-31 DIAGNOSIS — C3411 Malignant neoplasm of upper lobe, right bronchus or lung: Secondary | ICD-10-CM | POA: Diagnosis not present

## 2023-10-31 DIAGNOSIS — F4310 Post-traumatic stress disorder, unspecified: Secondary | ICD-10-CM | POA: Diagnosis not present

## 2023-10-31 DIAGNOSIS — Z825 Family history of asthma and other chronic lower respiratory diseases: Secondary | ICD-10-CM | POA: Diagnosis not present

## 2023-10-31 DIAGNOSIS — R220 Localized swelling, mass and lump, head: Secondary | ICD-10-CM | POA: Diagnosis not present

## 2023-11-01 ENCOUNTER — Other Ambulatory Visit: Payer: Self-pay | Admitting: *Deleted

## 2023-11-01 ENCOUNTER — Other Ambulatory Visit (HOSPITAL_COMMUNITY): Payer: Self-pay | Admitting: Student

## 2023-11-01 NOTE — Patient Instructions (Signed)
 Visit Information  Thank you for taking time to visit with me today. Please don't hesitate to contact me if I can be of assistance to you before our next scheduled telephone appointment.  Our next appointment is by telephone on 11/08/23 at 1015 am  Following is a copy of your care plan:   Goals Addressed             This Visit's Progress    TOC Care Plan       Current Barriers:  Medication management Several medication changes  Recent brain Surgery Patient reports she saw primary care provider 10/18/23, oncologist 10/24/23, will be having port placed and start radiation and chemotherapy, pt is to attend chemo class on 10/30/23 and will be seeing nutritionist,  pt states she is sleeping better since finishing steroids, eating well most days, continues having intermittent nausea with zofran providing relief. 11/01/23- Patient reports she will have port placed on 11/03/23, will start radiation on 11/13/23, then will have chemotherapy,  pt states she is no longer having headaches and nausea since taking decadron QID prn, pt has not been checking CBG since went off initial steroids, states " I haven't been diagnosed with diabetes"  RNCM Clinical Goal(s):  Patient will work with the Care Management team over the next 30 days to address Transition of Care Barriers: Medication Management Support at home Provider appointments take all medications exactly as prescribed and will call provider for medication related questions as evidenced by no missed medication doses attend all scheduled medical appointments:  as evidenced by no missed appointments or testing  continue to work with RN Care Manager to address care management and care coordination needs  demonstrate ongoing self health care management ability  as evidenced by increased independence and returning to baseline through collaboration with RN Care manager, provider, and care team.   Interventions: Evaluation of current treatment plan related to   self management and patient's adherence to plan as established by provider  Transitions of Care:  New goal. Sick Day Rules Reviewed Doctor Visits  - discussed the importance of doctor visits Post discharge activity limitations prescribed by provider reviewed Post-op wound/incision care reviewed with patient/caregiver Reviewed Signs and symptoms of infection Reviewed good sleep hygiene Reviewed diet and importance of eating healthy, limiting/ avoiding sugary foods Reviewed all upcoming scheduled appointments including pulmonary 11/02/23 @ 1045 am Reinforced importance of staying well hydrated  Patient Goals/Self-Care Activities: Participate in Transition of Care Program/Attend TOC scheduled calls Take all medications as prescribed Attend all scheduled provider appointments Call pharmacy for medication refills 3-7 days in advance of running out of medications Attend church or other social activities Perform all self care activities independently  Perform IADL's (shopping, preparing meals, housekeeping, managing finances) independently Call provider office for new concerns or questions  Try to have a consistent night time routine, warm bath, cool, dark room, white noise Keep stress to a minimum Stay well hydrated Follow up with pulmonary on 4/10 @ 1045 am  Follow Up Plan:  Telephone follow up appointment with care management team member scheduled for: 11/08/23 @ 1015 am The patient has been provided with contact information for the care management team and has been advised to call with any health related questions or concerns.          Patient verbalizes understanding of instructions and care plan provided today and agrees to view in MyChart. Active MyChart status and patient understanding of how to access instructions and care plan via MyChart confirmed  with patient.     Telephone follow up appointment with care management team member scheduled for:  Please call the care guide team  at [phone] if you need to cancel or reschedule your appointment.   Please call the Suicide and Crisis Lifeline: 988 call the [place] National Suicide Prevention Lifeline: (315)295-0264 or TTY: (612) 840-4734 TTY 410-291-2789) to talk to a trained counselor call 1-800-273-TALK (toll free, 24 hour hotline) go to [facility] [address] [place] [phone]) call the [facility] Crisis Line: [phone] call 911 if you are experiencing a Mental Health or Behavioral Health Crisis or need someone to talk to.  Irving Shows [facility], BSN RN Care Manager/ Transition of Care [place]/ [facility] [phone]

## 2023-11-01 NOTE — Patient Outreach (Signed)
  Transition of Care week 4  Visit Note  11/01/2023  Name: Cayman Brogden MRN: [id number]          DOB: 11-07-1975  Situation: Patient enrolled in [facility] 30-day program. Visit completed with patient by telephone.   Background: Patient to have port placed on 11/03/23, will start radiation on 4/21, then chemotherapy, no longer having headaches and nausea since taking decadron QID prn, no new concerns reported, reports has all medications and taking as prescribed.    Past Medical History:  Diagnosis Date   Abnormal Pap smear of cervix 04/17/2018   Asthma    GERD (gastroesophageal reflux disease)    History of migraine 04/17/2018   Had been managed on Topamax but had side effect. Now not active.   Raynaud's disease    Spondylolisthesis of lumbar region 04/17/2018    Assessment: Patient Reported Symptoms:  Cognitive Alert and oriented  Neurological No symptoms reported    HEENT No symptoms reported    Cardiovascular No symptoms reported    Respiratory No symptoms reported    Endocrine No symptoms reported    Gastrointestinal No symptoms reported    Genitourinary No symptoms reported    Integumentary No symptoms reported    Musculoskeletal No symptoms reported    Psychosocial No symptoms reported     There were no vitals filed for this visit.  Medications Reviewed Today   Medications were not reviewed in this encounter     Recommendation:   Specialty provider follow-up 11/02/23 @ 1045 am pulmonary  Follow Up Plan:   Telephone follow-up 11/08/23 @ 1015 am  Irving Shows [facility], BSN RN Care Manager/ Transition of Care [place]/ [facility] [phone]

## 2023-11-02 ENCOUNTER — Other Ambulatory Visit: Payer: Self-pay | Admitting: Student

## 2023-11-02 ENCOUNTER — Other Ambulatory Visit: Payer: Self-pay

## 2023-11-02 ENCOUNTER — Encounter: Payer: Self-pay | Admitting: Oncology

## 2023-11-02 ENCOUNTER — Encounter: Payer: Self-pay | Admitting: Pulmonary Disease

## 2023-11-02 ENCOUNTER — Ambulatory Visit (HOSPITAL_COMMUNITY)

## 2023-11-02 ENCOUNTER — Ambulatory Visit: Admitting: Pulmonary Disease

## 2023-11-02 VITALS — BP 116/86 | HR 74 | Temp 97.6°F | Ht 64.0 in | Wt 134.8 lb

## 2023-11-02 DIAGNOSIS — F32A Depression, unspecified: Secondary | ICD-10-CM | POA: Diagnosis not present

## 2023-11-02 DIAGNOSIS — F419 Anxiety disorder, unspecified: Secondary | ICD-10-CM | POA: Diagnosis not present

## 2023-11-02 DIAGNOSIS — C3491 Malignant neoplasm of unspecified part of right bronchus or lung: Secondary | ICD-10-CM

## 2023-11-02 DIAGNOSIS — C7801 Secondary malignant neoplasm of right lung: Secondary | ICD-10-CM | POA: Diagnosis not present

## 2023-11-02 DIAGNOSIS — C3411 Malignant neoplasm of upper lobe, right bronchus or lung: Secondary | ICD-10-CM | POA: Diagnosis not present

## 2023-11-02 DIAGNOSIS — J439 Emphysema, unspecified: Secondary | ICD-10-CM | POA: Diagnosis not present

## 2023-11-02 DIAGNOSIS — Z87891 Personal history of nicotine dependence: Secondary | ICD-10-CM | POA: Diagnosis not present

## 2023-11-02 DIAGNOSIS — R220 Localized swelling, mass and lump, head: Secondary | ICD-10-CM | POA: Diagnosis not present

## 2023-11-02 DIAGNOSIS — G936 Cerebral edema: Secondary | ICD-10-CM | POA: Diagnosis not present

## 2023-11-02 DIAGNOSIS — J449 Chronic obstructive pulmonary disease, unspecified: Secondary | ICD-10-CM | POA: Diagnosis not present

## 2023-11-02 DIAGNOSIS — I7300 Raynaud's syndrome without gangrene: Secondary | ICD-10-CM | POA: Diagnosis not present

## 2023-11-02 DIAGNOSIS — F4310 Post-traumatic stress disorder, unspecified: Secondary | ICD-10-CM | POA: Diagnosis not present

## 2023-11-02 DIAGNOSIS — R0602 Shortness of breath: Secondary | ICD-10-CM | POA: Diagnosis not present

## 2023-11-02 DIAGNOSIS — K219 Gastro-esophageal reflux disease without esophagitis: Secondary | ICD-10-CM | POA: Diagnosis not present

## 2023-11-02 DIAGNOSIS — Z3202 Encounter for pregnancy test, result negative: Secondary | ICD-10-CM | POA: Diagnosis not present

## 2023-11-02 DIAGNOSIS — J45909 Unspecified asthma, uncomplicated: Secondary | ICD-10-CM | POA: Diagnosis not present

## 2023-11-02 DIAGNOSIS — Z825 Family history of asthma and other chronic lower respiratory diseases: Secondary | ICD-10-CM | POA: Diagnosis not present

## 2023-11-02 LAB — NITRIC OXIDE: Nitric Oxide: 17

## 2023-11-02 MED ORDER — BUDESONIDE-FORMOTEROL FUMARATE 160-4.5 MCG/ACT IN AERO
2.0000 | INHALATION_SPRAY | Freq: Two times a day (BID) | RESPIRATORY_TRACT | 11 refills | Status: DC
Start: 2023-11-02 — End: 2024-02-07

## 2023-11-02 MED ORDER — TRELEGY ELLIPTA 100-62.5-25 MCG/ACT IN AEPB: 1.0000 | INHALATION_SPRAY | Freq: Every day | RESPIRATORY_TRACT | 3 refills | Status: DC

## 2023-11-02 NOTE — Telephone Encounter (Signed)
 I suspect what made her cough was the powder base of the inhaler.  These inhalers also tend to cause some cough initially because they open the airways.  However we can try Symbicort 160/4.5, 2 inhalations twice a day to see if this is better tolerated.  This is a puffer not powdered based and she may tolerate better.  If she wants to go with this I can send the order.  Make sure she rinses her mouth well after use.

## 2023-11-02 NOTE — Telephone Encounter (Signed)
 I sent the prescription for the Symbicort to the pharmacy requested in [place], [place].  Please call [id number] - [place], [place] - [address] [phone]) to cancel the Trelegy please.

## 2023-11-02 NOTE — H&P (Shared)
 Chief Complaint: Patient was seen in consultation today for metastatic lung cancer, with consideration for Port-A-Cath placement.  Referring Provider(s): Dr. Cindie Crumbly, MD   Supervising Physician: Richarda Overlie  Patient Status: [facility] - Out-pt  Patient is Full Code  History of Present Illness: Julie Horn is a 48 y.o. female  with PMHx notable for asthma, GERD, migraines, Raynauds's disease, and spondylolisthesis of lumbar region.  Per Dr. 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  S/p cerebellar lesion resection consistent with lung primary.  Oncology history as below. -PET scan showed limited disease to lung and lymph nodes.  Discussed at tumor board, surgery is not a feasible option secondary to lymph node involvement.  Recommended chemoradiation. -Discussed briefly the risk versus benefits of chemoradiation followed by immunotherapy for a year.  Briefly discussed the agents used and common side effects.Chemotherapy, using a platinum-based agent, aims to enhance radiation sensitivity. Radiation side effects include numbness, tingling, nausea, immunosuppression, fatigue, and tiredness.  Will schedule for chemo education. -Discussed the risk versus benefits of port placement and its role in administering chemotherapy.  Will refer to IR for port placement. -Patient is planned to get brain radiation.  Will plan to start chemo RT to the lung after that.  Will coordinate care with [facility] -Caris testing on pathology pending at this time. -Nutrition referral. -Will obtain a baseline CT scan before the start of chemo RT.   Return to clinic before the first cycle of chemo RT."   Interventional Radiology was requested for Port-A-Cath placement. Patient is scheduled for same in IR today.  All labs and medications are within acceptable parameters. Patient has a latex  allergy. Patient has been NPO since midnight.   Patient states that she is difficult to numb as she has red hair.   Patient is currently experiencing a headache s/p craniotomy. No concerns otherwise.  Patient is alert and laying in bed, calm. Patient denies any fevers, chest pain, SOB, cough, abdominal pain, nausea, vomiting or bleeding.     Past Medical History:  Diagnosis Date   Abnormal Pap smear of cervix 04/17/2018   Asthma    GERD (gastroesophageal reflux disease)    History of migraine 04/17/2018   Had been managed on Topamax but had side effect. Now not active.   Raynaud's disease    Spondylolisthesis of lumbar region 04/17/2018    Past Surgical History:  Procedure Laterality Date   LAPAROSCOPY     TONSILLECTOMY AND ADENOIDECTOMY  1983    Allergies: Levofloxacin, Sulfa antibiotics, Azithromycin, Doxycycline, and Latex  Medications: Prior to Admission medications   Medication Sig Start Date End Date Taking? Authorizing Provider  acetaminophen (TYLENOL) 500 MG tablet Take 1,000 mg by mouth every 6 (six) hours as needed for mild pain (pain score 1-3). Take 2 tablets (1,000 mg total) by mouth every six (6) hours.    [provider]  albuterol (VENTOLIN HFA) 108 (90 Base) MCG/ACT inhaler Inhale 2 puffs into the lungs every 4 (four) hours as needed for wheezing or shortness of breath. 05/22/23   Mechele Claude, MD  ascorbic acid (VITAMIN C) 500 MG tablet Take 500 mg by mouth daily. Take 1 tablet (500 mg total) by mouth daily    [provider]  blood glucose meter kit and supplies 1 each by Other route as directed. Dispense based on patient and insurance preference.  glucose blood test strip Generic  drug: blood sugar diagnostic Use to check blood sugar as directed with insulin 3 times a day & for symptoms of high or low blood sugar.  lancets Misc Use to check blood sugar as directed with insulin 3 times a day & for symptoms of high or low blood  sugar. Patient not taking: Reported on 11/02/2023    [provider]  budesonide-formoterol (SYMBICORT) 160-4.5 MCG/ACT inhaler Inhale 2 puffs into the lungs 2 (two) times daily. Rinse mouth well after use. 11/02/23   Salena Saner, MD  butalbital-acetaminophen-caffeine (FIORICET) [phone] MG tablet Take 1 tablet by mouth as needed for migraine. 11/01/23 11/06/23  [provider]  CARBOPLATIN IV Inject into the vein.    [provider]  co-enzyme Q-10 30 MG capsule Take 30 mg by mouth 3 (three) times daily. Take 1 capsule (30 mg total) by mouth Three (3) times a day.    [provider]  dexamethasone (DECADRON) 4 MG tablet Take 4 mg by mouth 4 (four) times daily as needed. TUESDAY IS LAST DAY 10/12/23   [provider]  dexamethasone (DECADRON) 4 MG tablet Take 2 tablets daily for 2 days, start the day after chemotherapy. Take with food. 10/30/23   Cindie Crumbly, MD  docusate sodium (COLACE) 100 MG capsule Take 100 mg by mouth 3 (three) times daily as needed for mild constipation. Take 1 capsule (100 mg total) by mouth Three (3) times a day as needed for constipation.    [provider]  guaiFENesin (MUCINEX) 600 MG 12 hr tablet Take 600 mg by mouth 2 (two) times daily. Take 1 tablet (600 mg total) by mouth every twelve (12) hours.    [provider]  hydrOXYzine (VISTARIL) 25 MG capsule Take 1 capsule (25 mg total) by mouth 3 (three) times daily. For anxiety and sleep. 10/18/23   Mechele Claude, MD  ibuprofen (ADVIL) 100 MG tablet Take 200-400 mg by mouth every 6 (six) hours as needed for pain.    [provider]  lidocaine-prilocaine (EMLA) cream Apply to affected area once 10/30/23   Cindie Crumbly, MD  magnesium oxide (MAG-OX) 400 (240 Mg) MG tablet Take 400 mg by mouth daily. Take 1 tablet (400 mg total) by mouth daily.    [provider]  melatonin 1 MG TABS tablet Take 1 mg by mouth at bedtime. Take 5 tablets (5 mg  total) by mouth nightly.    [provider]  methocarbamol (ROBAXIN) 500 MG tablet Take 1 tablet (500 mg total) by mouth 3 (three) times daily. Patient taking differently: Take 250 mg by mouth every 8 (eight) hours as needed for muscle spasms. 05/18/23   Vickki Hearing, MD  montelukast (SINGULAIR) 10 MG tablet Take 1 tablet (10 mg total) by mouth at bedtime. Patient taking differently: Take 10 mg by mouth daily as needed (allergies). 05/22/23   Mechele Claude, MD  omeprazole (PRILOSEC) 20 MG capsule Take 20 mg by mouth daily. 09/28/23   [provider]  ondansetron (ZOFRAN) 8 MG tablet Take 1 tablet (8 mg total) by mouth every 8 (eight) hours as needed for nausea or vomiting. Start on the third day after chemotherapy. 10/30/23   Cindie Crumbly, MD  ondansetron (ZOFRAN-ODT) 4 MG disintegrating tablet Take 1 tablet (4 mg total) by mouth every 8 (eight) hours as needed for nausea or vomiting. 10/18/23   Mechele Claude, MD  PACLitaxel (TAXOL IV) Inject into the vein.    [provider]  Palonosetron HCl (ALOXI  IV) Inject into the vein.    [provider]  prochlorperazine (COMPAZINE) 10 MG tablet Take 1 tablet (10 mg total) by mouth every 6 (six) hours as needed for nausea or vomiting. 10/30/23   Cindie Crumbly, MD  senna (SENOKOT) 8.6 MG tablet Take 2 tablets by mouth daily. 10/12/23 11/11/23  [provider]  sennosides-docusate sodium (SENOKOT-S) 8.6-50 MG tablet Take 2 tablets by mouth daily. Patient not taking: Reported on 11/02/2023    [provider]  tizanidine (ZANAFLEX) 2 MG capsule Take 2 mg by mouth every 6 (six) hours as needed for muscle spasms. Take 1 tablet (2 mg total) by mouth every six (6) hours as needed (neck pain).    [provider]  traMADol (ULTRAM) 50 MG tablet Take 50 mg by mouth every 6 (six) hours as needed.    [provider]  traZODone (DESYREL) 150 MG tablet Use from 1/3 to 1 tablet nightly as needed  for sleep. Patient taking differently: Take 75 mg by mouth at bedtime as needed for sleep. Use from 1/3 to 1 tablet nightly as needed for sleep. 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    Smoked 1 PPD at her heaviest.     Quit smoking in 06/2023- khj 10/03/2023  Vaping Use   Vaping status: Former  Substance and Sexual Activity   Alcohol use: Yes    Comment: occasionally    Drug use: Not Currently   Sexual activity: Yes    Birth control/protection: None    Comment: [id number] ,? infertility  Other Topics Concern   Not on file  Social History Narrative   Not on file   Social Drivers of Health   Financial Resource Strain: Patient Declined (10/17/2023)   Overall Financial Resource Strain (CARDIA)    Difficulty of Paying Living Expenses: Patient declined  Food Insecurity: No Food Insecurity (11/02/2023)   Received from [facility]   Hunger Vital Sign    Worried About Running Out of Food in the Last Year: Never true    Ran Out of Food in the Last Year: Never true  Transportation Needs: No Transportation Needs  (11/02/2023)   Received from [facility] - Transportation    Lack of Transportation (Medical): No    Lack of Transportation (Non-Medical): No  Physical Activity: Insufficiently Active (10/17/2023)   Exercise Vital Sign    Days of Exercise per  Week: 3 days    Minutes of Exercise per Session: 20 min  Stress: No Stress Concern Present (10/17/2023)   [employer] of Occupational Health - Occupational Stress Questionnaire    Feeling of Stress : Not at all  Social Connections: Unknown (10/17/2023)   Social Connection and Isolation Panel [NHANES]    Frequency of Communication with Friends and Family: More than three times a week    Frequency of Social Gatherings with Friends and Family: More than three times a week    Attends Religious Services: Patient declined    Database administrator or Organizations: Patient declined    Attends Banker Meetings: Not on file    Marital Status: Living with partner     Review of Systems: A 12 point ROS discussed and pertinent positives are indicated in the HPI above.  All other systems are negative.  Vital Signs: BP 128/89   Pulse 71   Temp 98 F (36.7 C) (Oral)   Resp (!) 22   Ht 5\' 4"  (1.626 m)   Wt 134 lb (60.8 kg)   SpO2 99%   BMI 23.00 kg/m   Advance Care Plan: The advanced care place/surrogate decision maker was discussed at the time of visit and the patient did not wish to discuss or was not able to name a surrogate decision maker or provide an advance care plan.  Physical Exam Constitutional:      General: She is not in acute distress.    Appearance: Normal appearance.  HENT:     Mouth/Throat:     Mouth: Mucous membranes are dry.  Cardiovascular:     Rate and Rhythm: Normal rate and regular rhythm.     Heart sounds: No murmur heard. Pulmonary:     Effort: Pulmonary effort is normal.     Breath sounds: Normal breath sounds. No wheezing.  Musculoskeletal:        General: Normal range of motion.      Cervical back: Normal range of motion.  Skin:    General: Skin is warm and dry.  Neurological:     Mental Status: She is alert and oriented to person, place, and time.  Psychiatric:        Mood and Affect: Mood normal.        Behavior: Behavior normal.        Thought Content: Thought content normal.        Judgment: Judgment normal.     Imaging: NM PET Image Initial (PI) Skull Base To Thigh Result Date: 10/17/2023 CLINICAL DATA:  Initial treatment strategy for non-small cell lung cancer. EXAM: NUCLEAR MEDICINE PET SKULL BASE TO THIGH TECHNIQUE: 6.2 mCi F-18 FDG was injected intravenously. Full-ring PET imaging was performed from the skull base to thigh after the radiotracer. CT data was obtained and used for attenuation correction and anatomic localization. Fasting blood glucose: 103 mg/dl COMPARISON:  None Available. FINDINGS: Choose one NECK: No hypermetabolic lymph nodes in the neck. Incidental CT findings: None. CHEST: Spiculated nodule in the RIGHT upper lobe measures 1.3 x 2.0 cm with SUV max equal 5.7 on image 43. RIGHT lower paratracheal node measures 8 mm (image 48) with SUV max equal 3.7. RIGHT hilar node measuring 11 mm node 54 with SUV max equal 3.0. No contralateral hypermetabolic lymph nodes. No supraclavicular adenopathy. Incidental CT findings: None. ABDOMEN/PELVIS: No abnormal hypermetabolic activity within the liver, pancreas, adrenal glands, or spleen. No hypermetabolic lymph nodes in the abdomen or pelvis. There is intense  activity through the distal aspect of the anal canal which is favored physiologic. SUV max equal 4.4 on image 145. Incidental CT findings: None. SKELETON: No focal hypermetabolic activity to suggest skeletal metastasis. Incidental CT findings: None. IMPRESSION: 1. Hypermetabolic RIGHT upper lobe lung nodule consistent primary bronchogenic carcinoma. 2. Mildly hypermetabolic RIGHT lower paratracheal and RIGHT hilar lymph nodes are concerning for nodal metastasis.  No contralateral hypermetabolic lymph nodes. 3. No evidence of distant metastatic disease. 4. Intense activity through the distal anal canal is favored physiologic. Recommend digital rectal exam. Electronically Signed   By: Genevive Bi M.D.   On: 10/17/2023 14:11    Labs:  CBC: Recent Labs    05/22/23 1054 09/25/23 1927  WBC 6.3 7.5  HGB 15.6 15.9*  HCT 46.7* 46.8*  PLT 231 259    COAGS: No results for input(s): "INR", "APTT" in the last 8760 hours.  BMP: Recent Labs    05/22/23 1054 09/25/23 1927  NA 145* 136  K 4.5 4.1  CL 104 103  CO2 22 23  GLUCOSE 97 115*  BUN 7 9  CALCIUM 9.9 10.1  CREATININE 0.71 0.63  GFRNONAA  --  >60    LIVER FUNCTION TESTS: Recent Labs    05/22/23 1054  BILITOT 0.5  AST 15  ALT 23  ALKPHOS 75  PROT 7.4  ALBUMIN 4.8    TUMOR MARKERS: No results for input(s): "AFPTM", "CEA", "CA199", "CHROMGRNA" in the last 8760 hours.  Assessment and Plan: Patient is diagnosed with right lung cancer, with metastasis to brain. She is status post cerebellar lesion resection. Dr. Anders Simmonds recommends chemoradiation, and port placement.  Patient presents for scheduled Port-A-Cath placement in IR today.  Risks and benefits of image guided port-a-catheter placement was discussed with the patient including, but not limited to bleeding, infection, pneumothorax, or fibrin sheath development and need for additional procedures.  All of the patient's questions were answered, patient is agreeable to proceed.  Consent signed and in chart.      Thank you for allowing our service to participate in Shykeria Kayona Foor 's care.  Electronically Signed: Sable Feil, PA-C   11/03/2023, 11:03 AM      I spent a total of 30 Minutes in face to face in clinical consultation, greater than 50% of which was counseling/coordinating care for  metastatic lung cancer, with consideration for Port-A-Cath placement.

## 2023-11-02 NOTE — Progress Notes (Signed)
 Subjective:    Patient ID: Julie Horn, female    DOB: 1976/05/07, 48 y.o.   MRN: [id number]  Patient Care Team: Mechele Claude, MD as PCP - General (Family Medicine) Pollyann Savoy, MD as Consulting Physician (Rheumatology) Cindie Crumbly, MD as Medical Oncologist (Medical Oncology) Therese Sarah, RN as Oncology Nurse Navigator (Medical Oncology) Salena Saner, MD as Consulting Physician (Pulmonary Disease)  Chief Complaint  Patient presents with   Follow-up    BACKGROUND/INTERVAL:  This is a 48 year old former smoker who presents for follow-up for a lung nodule.  HPI Discussed the use of AI scribe software for clinical note transcription with the patient, who gave verbal consent to proceed.  History of Present Illness   Julie Horn is a 48 year old female with adenocarcinoma of the lung who presents for follow-up.  She has undergone craniectomy for excision of a tumor in the posterior fossa confirming the diagnosis of a static adenocarcinoma of the lung and is scheduled to receive chemotherapy at [facility] and radiation therapy at [facility] in [place] ([facility]viously [facility]), which is closer to her home.  She experiences periodic difficulty in taking deep breaths, a symptom she noticed even before her surgery.  When she is on steroids for chemotherapy she feels that the sensation is better. She describes the sensation as not getting a deep enough breath. She has an emergency inhaler but rarely needs to use it.  She has not tried using the emergency inhaler for this sensation.  After discontinuing steroids, she used the inhaler a few times, which provided relief. However, her current breathing issue is not associated with wheezing but rather an inability to take a deep breath.  She has a history of smoking, which may contribute to her symptoms.  He has a past history of asthma.  There is some evidence of COPD on her scan, and mild air trapping is suspected.    She does not endorse any other symptomatology.  She would like to follow-up with pulmonary in our [place] office.     Review of Systems A 10 point review of systems was performed and it is as noted above otherwise negative.   Patient Active Problem List   Diagnosis Date Noted   Metastatic lung carcinoma, right (HCC) 09/27/2023   Metastasis to brain (HCC) 09/27/2023   Situational anxiety 07/27/2021   Family history of breast cancer 07/06/2021   Bilateral hand pain 05/07/2019   Raynaud's phenomenon without gangrene 04/26/2019   Irregular menses 04/26/2019   Nicotine dependence with current use 04/17/2018   Intermittent asthma, uncomplicated 04/17/2018   GERD (gastroesophageal reflux disease) 04/17/2018   History of migraine 04/17/2018   Spondylolisthesis of lumbar region 04/17/2018   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   LGSIL on Pap smear of cervix 04/17/2018   Fibroid uterus 04/17/2018    Social History   Tobacco Use   Smoking status: Former    Current packs/day: 0.00    Average packs/day: 1 pack/day for 20.0 years (20.0 ttl pk-yrs)    Types: Cigarettes    Quit date: 06/2023    Years since quitting: 0.3   Smokeless tobacco: Never   Tobacco comments:    Started smoking at 48 years old.    Smoked 1 PPD at her heaviest.     Quit smoking in 06/2023- khj 10/03/2023  Substance Use Topics   Alcohol use: Yes    Comment: occasionally     Allergies  Allergen Reactions  Levofloxacin Other (See Comments)    "Red skin, felt like skin was on fire, sensitive to touch"   Sulfa Antibiotics Other (See Comments)    Severe stomach pain   Azithromycin Rash   Doxycycline Rash   Latex Rash    Current Meds  Medication Sig   acetaminophen (TYLENOL) 500 MG tablet Take 1,000 mg by mouth every 6 (six) hours as needed for mild pain (pain score 1-3). Take 2 tablets (1,000 mg total) by mouth every six (6) hours.   albuterol (VENTOLIN HFA) 108 (90 Base)  MCG/ACT inhaler Inhale 2 puffs into the lungs every 4 (four) hours as needed for wheezing or shortness of breath.   ascorbic acid (VITAMIN C) 500 MG tablet Take 500 mg by mouth daily. Take 1 tablet (500 mg total) by mouth daily   budesonide-formoterol (SYMBICORT) 160-4.5 MCG/ACT inhaler Inhale 2 puffs into the lungs 2 (two) times daily. Rinse mouth well after use.   butalbital-acetaminophen-caffeine (FIORICET) 50-325-40 MG tablet Take 1 tablet by mouth as needed for migraine.   CARBOPLATIN IV Inject into the vein.   co-enzyme Q-10 30 MG capsule Take 30 mg by mouth 3 (three) times daily. Take 1 capsule (30 mg total) by mouth Three (3) times a day.   dexamethasone (DECADRON) 4 MG tablet Take 4 mg by mouth 4 (four) times daily as needed. TUESDAY IS LAST DAY   dexamethasone (DECADRON) 4 MG tablet Take 2 tablets daily for 2 days, start the day after chemotherapy. Take with food.   docusate sodium (COLACE) 100 MG capsule Take 100 mg by mouth 3 (three) times daily as needed for mild constipation. Take 1 capsule (100 mg total) by mouth Three (3) times a day as needed for constipation.   guaiFENesin (MUCINEX) 600 MG 12 hr tablet Take 600 mg by mouth 2 (two) times daily. Take 1 tablet (600 mg total) by mouth every twelve (12) hours.   hydrOXYzine (VISTARIL) 25 MG capsule Take 1 capsule (25 mg total) by mouth 3 (three) times daily. For anxiety and sleep.   ibuprofen (ADVIL) 100 MG tablet Take 200-400 mg by mouth every 6 (six) hours as needed for pain.   lidocaine-prilocaine (EMLA) cream Apply to affected area once   magnesium oxide (MAG-OX) 400 (240 Mg) MG tablet Take 400 mg by mouth daily. Take 1 tablet (400 mg total) by mouth daily.   melatonin 1 MG TABS tablet Take 1 mg by mouth at bedtime. Take 5 tablets (5 mg total) by mouth nightly.   methocarbamol (ROBAXIN) 500 MG tablet Take 1 tablet (500 mg total) by mouth 3 (three) times daily. (Patient taking differently: Take 250 mg by mouth every 8 (eight) hours as  needed for muscle spasms.)   montelukast (SINGULAIR) 10 MG tablet Take 1 tablet (10 mg total) by mouth at bedtime. (Patient taking differently: Take 10 mg by mouth daily as needed (allergies).)   omeprazole (PRILOSEC) 20 MG capsule Take 20 mg by mouth daily.   ondansetron (ZOFRAN) 8 MG tablet Take 1 tablet (8 mg total) by mouth every 8 (eight) hours as needed for nausea or vomiting. Start on the third day after chemotherapy.   ondansetron (ZOFRAN-ODT) 4 MG disintegrating tablet Take 1 tablet (4 mg total) by mouth every 8 (eight) hours as needed for nausea or vomiting.   PACLitaxel (TAXOL IV) Inject into the vein.   Palonosetron HCl (ALOXI IV) Inject into the vein.   prochlorperazine (COMPAZINE) 10 MG tablet Take 1 tablet (10 mg total) by mouth  every 6 (six) hours as needed for nausea or vomiting.   senna (SENOKOT) 8.6 MG tablet Take 2 tablets by mouth daily.   tizanidine (ZANAFLEX) 2 MG capsule Take 2 mg by mouth every 6 (six) hours as needed for muscle spasms. Take 1 tablet (2 mg total) by mouth every six (6) hours as needed (neck pain).   traMADol (ULTRAM) 50 MG tablet Take 50 mg by mouth every 6 (six) hours as needed.   traZODone (DESYREL) 150 MG tablet Use from 1/3 to 1 tablet nightly as needed for sleep. (Patient taking differently: Take 75 mg by mouth at bedtime as needed for sleep. Use from 1/3 to 1 tablet nightly as needed for sleep.)   [DISCONTINUED] Fluticasone-Umeclidin-Vilant (TRELEGY ELLIPTA) 100-62.5-25 MCG/ACT AEPB Inhale 1 puff into the lungs daily.     There is no immunization history on file for this patient.      Objective:     BP 116/86 (BP Location: Right Arm, Patient Position: Sitting, Cuff Size: Normal)   Pulse 74   Temp 97.6 F (36.4 C) (Temporal)   Ht 5\' 4"  (1.626 m)   Wt 134 lb 12.8 oz (61.1 kg)   SpO2 98%   BMI 23.14 kg/m   SpO2: 98 %  GENERAL: Well-developed, well-nourished woman, no acute distress HEAD: Normocephalic, atraumatic.  EYES: Pupils equal,  round, reactive to light.  No scleral icterus.  MOUTH: Patient intact, oral mucosa moist.  No thrush. NECK: Supple. No thyromegaly. Trachea midline. No JVD.  No adenopathy. PULMONARY: Good air entry bilaterally.  No adventitious sounds. CARDIOVASCULAR: S1 and S2. Regular rate and rhythm.  No rubs, murmurs or gallops heard. ABDOMEN: Benign. MUSCULOSKELETAL: No joint deformity, no clubbing, no edema.  NEUROLOGIC: Mild unsteadiness of gait noted SKIN: Intact,warm,dry. PSYCH: Mood and behavior normal.   Lab Results  Component Value Date   NITRICOXIDE 17 11/02/2023  *No evidence of type II (eosinophilic) inflammation.       Assessment & Plan:     ICD-10-CM   1. Shortness of breath  R06.02 Nitric oxide    Pulmonary function test    2. COPD suggested by initial evaluation (HCC)  J44.9 Pulmonary function test    3. Metastatic lung carcinoma, right (HCC)  C78.01     4. Adenocarcinoma, lung, right (HCC)  C34.91       Orders Placed This Encounter  Procedures   Nitric oxide   Pulmonary function test    Standing Status:   Future    Expected Date:   12/02/2023    Expiration Date:   11/01/2024    Where should this test be performed?:   [facility]    What type of PFT is being ordered?:   Full PFT    MIP/MEP:   No    ABG:   No    Methacholine challenge:   No    Meds ordered this encounter  Medications   DISCONTD: Fluticasone-Umeclidin-Vilant (TRELEGY ELLIPTA) 100-62.5-25 MCG/ACT AEPB    Sig: Inhale 1 puff into the lungs daily.    Dispense:  28 each    Refill:  3   budesonide-formoterol (SYMBICORT) 160-4.5 MCG/ACT inhaler    Sig: Inhale 2 puffs into the lungs 2 (two) times daily. Rinse mouth well after use.    Dispense:  1 each    Refill:  11   Discussion:    Adenocarcinoma of the lung Undergoing curative chemotherapy at [facility] and radiation therapy at [facility] in [place] (formerly [facility]). Optimistic about the  treatment plan. - Continue chemotherapy at [facility] -  Continue radiation therapy at [facility] in [facility]  Dyspnea Experiences periodic difficulty in achieving a deep breath, potentially related to air trapping from COPD, as suggested by imaging. Smoking history may contribute to COPD. - Initiate treatment for potential COPD - Order pulmonary function tests at [facility] - Assess airway inflammation levels: No evidence of type II inflammation  Chronic Obstructive Pulmonary Disease (COPD) Imaging indicates COPD, likely not severe. Uses an emergency inhaler with relief after discontinuing steroids. - Evaluate COPD severity with pulmonary function tests at [facility] - Consider follow-up at our clinic in [place] for convenience    *Initially prescribed Trelegy Ellipta for the patient however after patient got home she stated that she preferred nonpowdered inhalers.  Switched the prescription to Symbicort 160/4.5, 2 inhalations twice a day.  Advised if symptoms do not improve or worsen, to please contact office for sooner follow up or seek emergency care.    I spent 33 minutes of dedicated to the care of this patient on the date of this encounter to include pre-visit review of records, face-to-face time with the patient discussing conditions above, post visit ordering of testing, clinical documentation with the electronic health record, making appropriate referrals as documented, and communicating necessary findings to members of the patients care team.     C. Danice Goltz, MD Advanced Bronchoscopy PCCM [facility]    *This note was generated using voice recognition software/Dragon and/or AI transcription program.  Despite best efforts to proofread, errors can occur which can change the meaning. Any transcriptional errors that result from this process are unintentional and may not be fully corrected at the time of dictation.

## 2023-11-02 NOTE — Patient Instructions (Signed)
 VISIT SUMMARY:  Today, we discussed your ongoing treatment for adenocarcinoma of the lung and addressed your breathing difficulties. You are scheduled to continue chemotherapy and radiation therapy. We also talked about your periodic difficulty in taking deep breaths, which may be related to COPD, and planned further evaluations and treatments.  YOUR PLAN:  -ADENOCARCINOMA OF THE LUNG: Adenocarcinoma of the lung is a type of lung cancer that begins in the glandular cells of the lung. You will continue your chemotherapy at [facility] and radiation therapy at [facility] in [place] as planned. We are optimistic about your treatment plan.  -DYSPNEA: Dyspnea means difficulty in breathing. You have been experiencing trouble taking deep breaths, which may be related to air trapping from COPD. We will start treatment for potential COPD, order pulmonary function tests at [facility], and assess your airway inflammation levels.  -CHRONIC OBSTRUCTIVE PULMONARY DISEASE (COPD): COPD is a chronic lung disease that makes it hard to breathe. Your imaging suggests mild COPD. We will evaluate the severity of your COPD with pulmonary function tests at [facility] and consider a follow-up with our clinic in [place] for your convenience.  INSTRUCTIONS:  Please continue with your scheduled chemotherapy at [facility] and radiation therapy at [facility] in [place]. We will also arrange for pulmonary function tests at [facility] to evaluate your breathing issues.  Follow-up will be with our clinic in [place] in 2 months time.

## 2023-11-03 ENCOUNTER — Encounter (HOSPITAL_COMMUNITY): Payer: Self-pay

## 2023-11-03 ENCOUNTER — Ambulatory Visit (HOSPITAL_COMMUNITY)
Admission: RE | Admit: 2023-11-03 | Discharge: 2023-11-03 | Disposition: A | Discharge status: Home / Self Care | Source: Ambulatory Visit | Attending: Oncology | Admitting: Oncology

## 2023-11-03 ENCOUNTER — Other Ambulatory Visit: Payer: Self-pay

## 2023-11-03 VITALS — BP 105/72 | HR 77 | Temp 98.0°F | Resp 23 | Ht 64.0 in | Wt 134.0 lb

## 2023-11-03 DIAGNOSIS — Z87891 Personal history of nicotine dependence: Secondary | ICD-10-CM | POA: Insufficient documentation

## 2023-11-03 DIAGNOSIS — C7801 Secondary malignant neoplasm of right lung: Secondary | ICD-10-CM | POA: Diagnosis not present

## 2023-11-03 DIAGNOSIS — I7300 Raynaud's syndrome without gangrene: Secondary | ICD-10-CM | POA: Insufficient documentation

## 2023-11-03 DIAGNOSIS — J45909 Unspecified asthma, uncomplicated: Secondary | ICD-10-CM | POA: Diagnosis not present

## 2023-11-03 DIAGNOSIS — C7931 Secondary malignant neoplasm of brain: Secondary | ICD-10-CM | POA: Insufficient documentation

## 2023-11-03 DIAGNOSIS — C3490 Malignant neoplasm of unspecified part of unspecified bronchus or lung: Secondary | ICD-10-CM | POA: Diagnosis not present

## 2023-11-03 DIAGNOSIS — K219 Gastro-esophageal reflux disease without esophagitis: Secondary | ICD-10-CM | POA: Insufficient documentation

## 2023-11-03 DIAGNOSIS — Z95828 Presence of other vascular implants and grafts: Secondary | ICD-10-CM

## 2023-11-03 HISTORY — PX: IR IMAGING GUIDED PORT INSERTION: IMG5740

## 2023-11-03 LAB — PREGNANCY, URINE: Preg Test, Ur: NEGATIVE

## 2023-11-03 MED ORDER — FENTANYL CITRATE (PF) 100 MCG/2ML IJ SOLN
INTRAMUSCULAR | Status: AC
  Filled 2023-11-03: qty 4

## 2023-11-03 MED ORDER — LIDOCAINE HCL 1 % IJ SOLN
20.0000 mL | Freq: Once | INTRAMUSCULAR | Status: AC
  Administered 2023-11-03: 10 mL

## 2023-11-03 MED ORDER — LIDOCAINE HCL 1 % IJ SOLN
INTRAMUSCULAR | Status: AC
  Filled 2023-11-03: qty 20

## 2023-11-03 MED ORDER — LIDOCAINE-EPINEPHRINE 1 %-1:100000 IJ SOLN
INTRAMUSCULAR | Status: AC
  Filled 2023-11-03: qty 1

## 2023-11-03 MED ORDER — FENTANYL CITRATE (PF) 100 MCG/2ML IJ SOLN
INTRAMUSCULAR | Status: AC | PRN
  Administered 2023-11-03 (×2): 25 ug via INTRAVENOUS
  Administered 2023-11-03: 50 ug via INTRAVENOUS

## 2023-11-03 MED ORDER — LIDOCAINE-EPINEPHRINE 1 %-1:100000 IJ SOLN
20.0000 mL | Freq: Once | INTRAMUSCULAR | Status: AC
  Administered 2023-11-03: 10 mL via INTRADERMAL

## 2023-11-03 MED ORDER — HEPARIN SOD (PORK) LOCK FLUSH 100 UNIT/ML IV SOLN: 500.0000 [IU] | Freq: Once | INTRAVENOUS | Status: DC

## 2023-11-03 MED ORDER — MIDAZOLAM HCL 2 MG/2ML IJ SOLN
INTRAMUSCULAR | Status: AC | PRN
  Administered 2023-11-03: 1 mg via INTRAVENOUS
  Administered 2023-11-03: .5 mg via INTRAVENOUS
  Administered 2023-11-03: 1 mg via INTRAVENOUS

## 2023-11-03 MED ORDER — HEPARIN SOD (PORK) LOCK FLUSH 100 UNIT/ML IV SOLN
INTRAVENOUS | Status: AC
  Filled 2023-11-03: qty 5

## 2023-11-03 MED ORDER — MIDAZOLAM HCL 2 MG/2ML IJ SOLN
INTRAMUSCULAR | Status: AC
  Filled 2023-11-03: qty 4

## 2023-11-03 NOTE — Procedures (Signed)
 Interventional Radiology Procedure:   Indications: Metastatic lung carcinoma, right [facility])   Procedure: Port placement  Findings: Right jugular port, tip at SVC/RA junction.    Complications: None     EBL: Minimal, less than 10 ml  Plan: Discharge in one hour.  Keep port site and incisions dry for at least 24 hours.     Cayley Pester R. Lowella Dandy, MD  Pager: [phone]

## 2023-11-06 ENCOUNTER — Inpatient Hospital Stay: Admitting: Dietician

## 2023-11-06 ENCOUNTER — Telehealth: Payer: Self-pay | Admitting: Dietician

## 2023-11-06 NOTE — Telephone Encounter (Signed)
 Nutrition Assessment   Reason for Assessment: Referral   ASSESSMENT: 48 year old female with lung cancer metastatic to brain. S/p cerebellar mets resection (3/19 at [facility]). She is planning whole brain radiation followed by concurrent chemoRT to lung with weekly paclitaxel/carboplatin. Patient is under the care of Dr. Orvis Blare.   Past medical history includes GERD, spondylolisthesis of lumbar region, fibroid uterus, nicotine dependence, raynaud's phenomenon without gangrene, situational anxiety  Spoke with patient via telephone. She reports feeling more like her old self the last couple of days. Patient reports repeat steroid taper per neurosurgeon. She feels this is what was needed. Patient will complete this tomorrow (4/15). Nausea has resolved. Patient reports increased energy. Her appetite is great some days and others she does not feel like eating much. Patient reports limiting intake of sugar as this feeds cancer. She drinks ~80 ounces of water as well as body armor. Patient has questions about what to eat during treatment.   Nutrition Focused Physical Exam: unable to complete (telephone visit)   Medications: symbicort, CoQ10, decadron, colace, mucinex, hydroxyzine, mag-ox, robaxin, singular, prilosec, zofran, compazine, senna, zanaflex, tramadol, trazodone   Labs: 3/19 labs reviewed    Anthropometrics:   Height: 5'4" Weight: 134 lb 12.8 oz on 4/10 UBW: 129-132 lb  BMI: 23.14   NUTRITION DIAGNOSIS: Food and nutrition related knowledge deficit related to cancer as evidenced by no prior need for associated nutrition information   INTERVENTION:  Educated on importance of adequate calorie and protein energy intake to preserve LBM Discussed foods with protein, recommend protein source at every meal Suggested smaller more frequent meals vs 2-3 larger meals Suggested ONS for days with decreased appetite Handouts, coupons, contact information mailed per pt request   MONITORING,  EVALUATION, GOAL: Patient will tolerate increased calories and protein to minimize wt loss during treatment   Next Visit: To be scheduled with treatment plan

## 2023-11-07 ENCOUNTER — Telehealth: Payer: Self-pay | Admitting: Oncology

## 2023-11-07 ENCOUNTER — Other Ambulatory Visit: Payer: Self-pay

## 2023-11-08 ENCOUNTER — Other Ambulatory Visit: Payer: Self-pay | Admitting: *Deleted

## 2023-11-08 NOTE — Patient Outreach (Signed)
 Transition of Care  week 5  Visit Note  11/08/2023  Name: Julie Horn MRN: [id number]          DOB: [birth year]  Situation: Patient enrolled in [facility] 30-day program. Visit completed with patient by telephone.   Background: Patient will be starting radiation week 4/21 with chemotherapy to follow, pt is feeling well, has all medications and taking as prescribed, no new concerns reported.    Past Medical History:  Diagnosis Date   Abnormal Pap smear of cervix 04/17/2018   Asthma    GERD (gastroesophageal reflux disease)    History of migraine 04/17/2018   Had been managed on Topamax but had side effect. Now not active.   Raynaud's disease    Spondylolisthesis of lumbar region 04/17/2018    Assessment: Patient Reported Symptoms: Cognitive Cognitive Status: Able to follow simple commands, Alert and oriented to person, place, and time      Neurological  No symptoms reported    HEENT HEENT Symptoms Reported: No symptoms reported      Cardiovascular Cardiovascular Symptoms Reported: No symptoms reported    Respiratory Respiratory Symptoms Reported: No symptoms reported    Endocrine Patient reports the following symptoms related to hypoglycemia or hyperglycemia : No symptoms reported Is patient diabetic?: No (pt is no longer checking CBG since taking initial steroids)    Gastrointestinal Gastrointestinal Symptoms Reported:  (constipation is well managed at present) Gastrointestinal Conditions: Constipation Gastrointestinal Management Strategies: Adequate rest, Medication therapy Gastrointestinal Self-Management Outcome: 4 (good) Gastrointestinal Comment: pt is taking senekot and colace as needed which is helping her to have regular bowel movements Nutrition Risk Screen (CP): No indicators present (pt states appetite is good)  Genitourinary  No symptoms reported    Integumentary Integumentary Symptoms Reported: No symptoms reported    Musculoskeletal Musculoskelatal  Symptoms Reviewed: No symptoms reported        Psychosocial Psychosocial Symptoms Reported: No symptoms reported         There were no vitals filed for this visit.  Medications Reviewed Today   Medications were not reviewed in this encounter      Goals Addressed             This Visit's Progress    COMPLETED: TOC Care Plan       Current Barriers:  Medication management Several medication changes  Recent brain Surgery Patient reports she saw primary care provider 10/18/23, oncologist 10/24/23, will be having port placed and start radiation and chemotherapy, pt is to attend chemo class on 10/30/23 and will be seeing nutritionist,  pt states she is sleeping better since finishing steroids, eating well most days, continues having intermittent nausea with zofran providing relief. 11/01/23- Patient reports she will have port placed on 11/03/23, will start radiation on 11/13/23, then will have chemotherapy,  pt states she is no longer having headaches and nausea since taking decadron QID prn, pt has not been checking CBG since went off initial steroids, states " I haven't been diagnosed with diabetes" 11/08/23- patient reports she is feeling well, had porta cath placed on 11/03/23, states "looks good, just a little sore", pt denies nausea, continues to manage constipation with senekot and colace, starts radiation M-F week of 11/13/23, chemotherapy begins in a few weeks, no new concerns reported  RNCM Clinical Goal(s):  Patient will work with the Care Management team over the next 30 days to address Transition of Care Barriers: Medication Management Support at home Provider appointments take all medications exactly as prescribed and  will call provider for medication related questions as evidenced by no missed medication doses attend all scheduled medical appointments:  as evidenced by no missed appointments or testing  continue to work with RN Care Manager to address care management and care  coordination needs  demonstrate ongoing self health care management ability  as evidenced by increased independence and returning to baseline through collaboration with RN Care manager, provider, and care team.   Interventions: Evaluation of current treatment plan related to  self management and patient's adherence to plan as established by provider  Transitions of Care:  Goal on track:  Yes., Goal Met., and Patient declined further engagement on this goal. Sick Day Rules Reviewed Doctor Visits  - discussed the importance of doctor visits Post discharge activity limitations prescribed by provider reviewed Post-op wound/incision care reviewed with patient/caregiver Reviewed Signs and symptoms of infection Reinforced good sleep hygiene Reviewed diet and importance of eating healthy, limiting/ avoiding sugary foods Reviewed all upcoming scheduled appointments  Reviewed importance of staying well hydrated Reviewed signs/ symptoms of infection, avoiding sick people, good handwashing Reviewed plan of care including case closure, pt has a lot of upcoming appointments, radiation, chemotherapy, if health status, etc changes in the future, pt verbalizes understanding to let primary care provider know and request care management if needed  Patient Goals/Self-Care Activities: Participate in Transition of Care Program/Attend TOC scheduled calls Take all medications as prescribed Attend all scheduled provider appointments Call pharmacy for medication refills 3-7 days in advance of running out of medications Attend church or other social activities Perform all self care activities independently  Perform IADL's (shopping, preparing meals, housekeeping, managing finances) independently Call provider office for new concerns or questions  Try to have a consistent night time routine, warm bath, cool, dark room, white noise Keep stress to a minimum Stay well hydrated Avoid sick people, report any change in  health status, symptoms to your doctor  Follow Up Plan:  No further follow up, case closure The patient has been provided with contact information for the care management team and has been advised to call with any health related questions or concerns.          Recommendation:   Specialty provider follow-up oncology  Follow Up Plan:   Patient has met all care management goals. Care Management case will be closed. Patient has been provided contact information should new needs arise.   Cecilie Coffee [facility], BSN RN Care Manager/ Transition of Care [place] [phone]

## 2023-11-08 NOTE — Patient Instructions (Signed)
 Visit Information  Thank you for taking time to visit with me today. Please don't hesitate to contact me if I can be of assistance to you before our next scheduled telephone appointment.  Our next appointment is no further scheduled appointments.    Following is a copy of your care plan:   Goals Addressed             This Visit's Progress    COMPLETED: TOC Care Plan       Current Barriers:  Medication management Several medication changes  Recent brain Surgery Patient reports she saw primary care provider 10/18/23, oncologist 10/24/23, will be having port placed and start radiation and chemotherapy, pt is to attend chemo class on 10/30/23 and will be seeing nutritionist,  pt states she is sleeping better since finishing steroids, eating well most days, continues having intermittent nausea with zofran providing relief. 11/01/23- Patient reports she will have port placed on 11/03/23, will start radiation on 11/13/23, then will have chemotherapy,  pt states she is no longer having headaches and nausea since taking decadron QID prn, pt has not been checking CBG since went off initial steroids, states " I haven't been diagnosed with diabetes" 11/08/23- patient reports she is feeling well, had porta cath placed on 11/03/23, states "looks good, just a little sore", pt denies nausea, continues to manage constipation with senekot and colace, starts radiation M-F week of 11/13/23, chemotherapy begins in a few weeks, no new concerns reported  RNCM Clinical Goal(s):  Patient will work with the Care Management team over the next 30 days to address Transition of Care Barriers: Medication Management Support at home Provider appointments take all medications exactly as prescribed and will call provider for medication related questions as evidenced by no missed medication doses attend all scheduled medical appointments:  as evidenced by no missed appointments or testing  continue to work with RN Care Manager to  address care management and care coordination needs  demonstrate ongoing self health care management ability  as evidenced by increased independence and returning to baseline through collaboration with RN Care manager, provider, and care team.   Interventions: Evaluation of current treatment plan related to  self management and patient's adherence to plan as established by provider  Transitions of Care:  Goal on track:  Yes., Goal Met., and Patient declined further engagement on this goal. Sick Day Rules Reviewed Doctor Visits  - discussed the importance of doctor visits Post discharge activity limitations prescribed by provider reviewed Post-op wound/incision care reviewed with patient/caregiver Reviewed Signs and symptoms of infection Reinforced good sleep hygiene Reviewed diet and importance of eating healthy, limiting/ avoiding sugary foods Reviewed all upcoming scheduled appointments  Reviewed importance of staying well hydrated Reviewed signs/ symptoms of infection, avoiding sick people, good handwashing Reviewed plan of care including case closure, pt has a lot of upcoming appointments, radiation, chemotherapy, if health status, etc changes in the future, pt verbalizes understanding to let primary care provider know and request care management if needed  Patient Goals/Self-Care Activities: Participate in Transition of Care Program/Attend TOC scheduled calls Take all medications as prescribed Attend all scheduled provider appointments Call pharmacy for medication refills 3-7 days in advance of running out of medications Attend church or other social activities Perform all self care activities independently  Perform IADL's (shopping, preparing meals, housekeeping, managing finances) independently Call provider office for new concerns or questions  Try to have a consistent night time routine, warm bath, cool, dark room, white noise Keep  stress to a minimum Stay well hydrated Avoid  sick people, report any change in health status, symptoms to your doctor  Follow Up Plan:  No further follow up, case closure The patient has been provided with contact information for the care management team and has been advised to call with any health related questions or concerns.          Patient verbalizes understanding of instructions and care plan provided today and agrees to view in MyChart. Active MyChart status and patient understanding of how to access instructions and care plan via MyChart confirmed with patient.     We have been unable to make contact with the patient for follow up. The care management team is available to follow up with the patient after provider conversation with the patient regarding recommendation for care management engagement and subsequent re-referral to the care management team.   Please call the care guide team at [phone] if you need to cancel or reschedule your appointment.   Please call the Suicide and Crisis Lifeline: 988 call the [place]  National Suicide Prevention Lifeline: 602-718-8716 or TTY: (360)823-0503 TTY 929-563-3039) to talk to a trained counselor call 1-800-273-TALK (toll free, 24 hour hotline) go to [facility] [address] [phone]) call the [facility] Crisis Line: (320) [phone] call 911 if you are experiencing a Mental Health or Behavioral Health Crisis or need someone to talk to.  Cecilie Coffee [facility], BSN RN Care Manager/ Transition of Care [place] [phone]

## 2023-11-09 DIAGNOSIS — F419 Anxiety disorder, unspecified: Secondary | ICD-10-CM | POA: Diagnosis not present

## 2023-11-09 DIAGNOSIS — J45909 Unspecified asthma, uncomplicated: Secondary | ICD-10-CM | POA: Diagnosis not present

## 2023-11-09 DIAGNOSIS — G936 Cerebral edema: Secondary | ICD-10-CM | POA: Diagnosis not present

## 2023-11-09 DIAGNOSIS — Z87891 Personal history of nicotine dependence: Secondary | ICD-10-CM | POA: Diagnosis not present

## 2023-11-09 DIAGNOSIS — F4310 Post-traumatic stress disorder, unspecified: Secondary | ICD-10-CM | POA: Diagnosis not present

## 2023-11-09 DIAGNOSIS — F32A Depression, unspecified: Secondary | ICD-10-CM | POA: Diagnosis not present

## 2023-11-09 DIAGNOSIS — C7931 Secondary malignant neoplasm of brain: Secondary | ICD-10-CM | POA: Diagnosis not present

## 2023-11-09 DIAGNOSIS — Z3202 Encounter for pregnancy test, result negative: Secondary | ICD-10-CM | POA: Diagnosis not present

## 2023-11-09 DIAGNOSIS — R220 Localized swelling, mass and lump, head: Secondary | ICD-10-CM | POA: Diagnosis not present

## 2023-11-09 DIAGNOSIS — K219 Gastro-esophageal reflux disease without esophagitis: Secondary | ICD-10-CM | POA: Diagnosis not present

## 2023-11-09 DIAGNOSIS — C3411 Malignant neoplasm of upper lobe, right bronchus or lung: Secondary | ICD-10-CM | POA: Diagnosis not present

## 2023-11-09 DIAGNOSIS — I7300 Raynaud's syndrome without gangrene: Secondary | ICD-10-CM | POA: Diagnosis not present

## 2023-11-09 DIAGNOSIS — G939 Disorder of brain, unspecified: Secondary | ICD-10-CM | POA: Diagnosis not present

## 2023-11-09 DIAGNOSIS — J439 Emphysema, unspecified: Secondary | ICD-10-CM | POA: Diagnosis not present

## 2023-11-09 DIAGNOSIS — Z825 Family history of asthma and other chronic lower respiratory diseases: Secondary | ICD-10-CM | POA: Diagnosis not present

## 2023-11-12 ENCOUNTER — Other Ambulatory Visit: Payer: Self-pay | Admitting: Family Medicine

## 2023-11-13 DIAGNOSIS — Z3202 Encounter for pregnancy test, result negative: Secondary | ICD-10-CM | POA: Diagnosis not present

## 2023-11-13 DIAGNOSIS — R220 Localized swelling, mass and lump, head: Secondary | ICD-10-CM | POA: Diagnosis not present

## 2023-11-13 DIAGNOSIS — I7300 Raynaud's syndrome without gangrene: Secondary | ICD-10-CM | POA: Diagnosis not present

## 2023-11-13 DIAGNOSIS — F32A Depression, unspecified: Secondary | ICD-10-CM | POA: Diagnosis not present

## 2023-11-13 DIAGNOSIS — F419 Anxiety disorder, unspecified: Secondary | ICD-10-CM | POA: Diagnosis not present

## 2023-11-13 DIAGNOSIS — Z87891 Personal history of nicotine dependence: Secondary | ICD-10-CM | POA: Diagnosis not present

## 2023-11-13 DIAGNOSIS — C7931 Secondary malignant neoplasm of brain: Secondary | ICD-10-CM | POA: Diagnosis not present

## 2023-11-13 DIAGNOSIS — J45909 Unspecified asthma, uncomplicated: Secondary | ICD-10-CM | POA: Diagnosis not present

## 2023-11-13 DIAGNOSIS — J439 Emphysema, unspecified: Secondary | ICD-10-CM | POA: Diagnosis not present

## 2023-11-13 DIAGNOSIS — C3490 Malignant neoplasm of unspecified part of unspecified bronchus or lung: Secondary | ICD-10-CM | POA: Diagnosis not present

## 2023-11-13 DIAGNOSIS — F4310 Post-traumatic stress disorder, unspecified: Secondary | ICD-10-CM | POA: Diagnosis not present

## 2023-11-13 DIAGNOSIS — G936 Cerebral edema: Secondary | ICD-10-CM | POA: Diagnosis not present

## 2023-11-13 DIAGNOSIS — Z825 Family history of asthma and other chronic lower respiratory diseases: Secondary | ICD-10-CM | POA: Diagnosis not present

## 2023-11-13 DIAGNOSIS — C3411 Malignant neoplasm of upper lobe, right bronchus or lung: Secondary | ICD-10-CM | POA: Diagnosis not present

## 2023-11-13 DIAGNOSIS — K219 Gastro-esophageal reflux disease without esophagitis: Secondary | ICD-10-CM | POA: Diagnosis not present

## 2023-11-14 DIAGNOSIS — G936 Cerebral edema: Secondary | ICD-10-CM | POA: Diagnosis not present

## 2023-11-14 DIAGNOSIS — C3411 Malignant neoplasm of upper lobe, right bronchus or lung: Secondary | ICD-10-CM | POA: Diagnosis not present

## 2023-11-14 DIAGNOSIS — F4310 Post-traumatic stress disorder, unspecified: Secondary | ICD-10-CM | POA: Diagnosis not present

## 2023-11-14 DIAGNOSIS — F419 Anxiety disorder, unspecified: Secondary | ICD-10-CM | POA: Diagnosis not present

## 2023-11-14 DIAGNOSIS — Z87891 Personal history of nicotine dependence: Secondary | ICD-10-CM | POA: Diagnosis not present

## 2023-11-14 DIAGNOSIS — K219 Gastro-esophageal reflux disease without esophagitis: Secondary | ICD-10-CM | POA: Diagnosis not present

## 2023-11-14 DIAGNOSIS — F32A Depression, unspecified: Secondary | ICD-10-CM | POA: Diagnosis not present

## 2023-11-14 DIAGNOSIS — I7300 Raynaud's syndrome without gangrene: Secondary | ICD-10-CM | POA: Diagnosis not present

## 2023-11-14 DIAGNOSIS — J45909 Unspecified asthma, uncomplicated: Secondary | ICD-10-CM | POA: Diagnosis not present

## 2023-11-14 DIAGNOSIS — Z825 Family history of asthma and other chronic lower respiratory diseases: Secondary | ICD-10-CM | POA: Diagnosis not present

## 2023-11-14 DIAGNOSIS — J439 Emphysema, unspecified: Secondary | ICD-10-CM | POA: Diagnosis not present

## 2023-11-14 DIAGNOSIS — Z3202 Encounter for pregnancy test, result negative: Secondary | ICD-10-CM | POA: Diagnosis not present

## 2023-11-14 DIAGNOSIS — R220 Localized swelling, mass and lump, head: Secondary | ICD-10-CM | POA: Diagnosis not present

## 2023-11-15 DIAGNOSIS — J45909 Unspecified asthma, uncomplicated: Secondary | ICD-10-CM | POA: Diagnosis not present

## 2023-11-15 DIAGNOSIS — J439 Emphysema, unspecified: Secondary | ICD-10-CM | POA: Diagnosis not present

## 2023-11-15 DIAGNOSIS — F419 Anxiety disorder, unspecified: Secondary | ICD-10-CM | POA: Diagnosis not present

## 2023-11-15 DIAGNOSIS — I7300 Raynaud's syndrome without gangrene: Secondary | ICD-10-CM | POA: Diagnosis not present

## 2023-11-15 DIAGNOSIS — C3411 Malignant neoplasm of upper lobe, right bronchus or lung: Secondary | ICD-10-CM | POA: Diagnosis not present

## 2023-11-15 DIAGNOSIS — R220 Localized swelling, mass and lump, head: Secondary | ICD-10-CM | POA: Diagnosis not present

## 2023-11-15 DIAGNOSIS — Z3202 Encounter for pregnancy test, result negative: Secondary | ICD-10-CM | POA: Diagnosis not present

## 2023-11-15 DIAGNOSIS — F4310 Post-traumatic stress disorder, unspecified: Secondary | ICD-10-CM | POA: Diagnosis not present

## 2023-11-15 DIAGNOSIS — G936 Cerebral edema: Secondary | ICD-10-CM | POA: Diagnosis not present

## 2023-11-15 DIAGNOSIS — Z825 Family history of asthma and other chronic lower respiratory diseases: Secondary | ICD-10-CM | POA: Diagnosis not present

## 2023-11-15 DIAGNOSIS — F32A Depression, unspecified: Secondary | ICD-10-CM | POA: Diagnosis not present

## 2023-11-15 DIAGNOSIS — Z87891 Personal history of nicotine dependence: Secondary | ICD-10-CM | POA: Diagnosis not present

## 2023-11-15 DIAGNOSIS — K219 Gastro-esophageal reflux disease without esophagitis: Secondary | ICD-10-CM | POA: Diagnosis not present

## 2023-11-16 DIAGNOSIS — Z825 Family history of asthma and other chronic lower respiratory diseases: Secondary | ICD-10-CM | POA: Diagnosis not present

## 2023-11-16 DIAGNOSIS — F419 Anxiety disorder, unspecified: Secondary | ICD-10-CM | POA: Diagnosis not present

## 2023-11-16 DIAGNOSIS — F32A Depression, unspecified: Secondary | ICD-10-CM | POA: Diagnosis not present

## 2023-11-16 DIAGNOSIS — Z3202 Encounter for pregnancy test, result negative: Secondary | ICD-10-CM | POA: Diagnosis not present

## 2023-11-16 DIAGNOSIS — K219 Gastro-esophageal reflux disease without esophagitis: Secondary | ICD-10-CM | POA: Diagnosis not present

## 2023-11-16 DIAGNOSIS — I7300 Raynaud's syndrome without gangrene: Secondary | ICD-10-CM | POA: Diagnosis not present

## 2023-11-16 DIAGNOSIS — J439 Emphysema, unspecified: Secondary | ICD-10-CM | POA: Diagnosis not present

## 2023-11-16 DIAGNOSIS — G936 Cerebral edema: Secondary | ICD-10-CM | POA: Diagnosis not present

## 2023-11-16 DIAGNOSIS — R220 Localized swelling, mass and lump, head: Secondary | ICD-10-CM | POA: Diagnosis not present

## 2023-11-16 DIAGNOSIS — F4310 Post-traumatic stress disorder, unspecified: Secondary | ICD-10-CM | POA: Diagnosis not present

## 2023-11-16 DIAGNOSIS — J45909 Unspecified asthma, uncomplicated: Secondary | ICD-10-CM | POA: Diagnosis not present

## 2023-11-16 DIAGNOSIS — Z87891 Personal history of nicotine dependence: Secondary | ICD-10-CM | POA: Diagnosis not present

## 2023-11-16 DIAGNOSIS — C3411 Malignant neoplasm of upper lobe, right bronchus or lung: Secondary | ICD-10-CM | POA: Diagnosis not present

## 2023-11-17 DIAGNOSIS — J45909 Unspecified asthma, uncomplicated: Secondary | ICD-10-CM | POA: Diagnosis not present

## 2023-11-17 DIAGNOSIS — I7300 Raynaud's syndrome without gangrene: Secondary | ICD-10-CM | POA: Diagnosis not present

## 2023-11-17 DIAGNOSIS — F4310 Post-traumatic stress disorder, unspecified: Secondary | ICD-10-CM | POA: Diagnosis not present

## 2023-11-17 DIAGNOSIS — C3411 Malignant neoplasm of upper lobe, right bronchus or lung: Secondary | ICD-10-CM | POA: Diagnosis not present

## 2023-11-17 DIAGNOSIS — C7931 Secondary malignant neoplasm of brain: Secondary | ICD-10-CM | POA: Diagnosis not present

## 2023-11-17 DIAGNOSIS — F419 Anxiety disorder, unspecified: Secondary | ICD-10-CM | POA: Diagnosis not present

## 2023-11-17 DIAGNOSIS — Z87891 Personal history of nicotine dependence: Secondary | ICD-10-CM | POA: Diagnosis not present

## 2023-11-17 DIAGNOSIS — J439 Emphysema, unspecified: Secondary | ICD-10-CM | POA: Diagnosis not present

## 2023-11-17 DIAGNOSIS — Z825 Family history of asthma and other chronic lower respiratory diseases: Secondary | ICD-10-CM | POA: Diagnosis not present

## 2023-11-17 DIAGNOSIS — K219 Gastro-esophageal reflux disease without esophagitis: Secondary | ICD-10-CM | POA: Diagnosis not present

## 2023-11-17 DIAGNOSIS — Z3202 Encounter for pregnancy test, result negative: Secondary | ICD-10-CM | POA: Diagnosis not present

## 2023-11-17 DIAGNOSIS — F32A Depression, unspecified: Secondary | ICD-10-CM | POA: Diagnosis not present

## 2023-11-17 DIAGNOSIS — R220 Localized swelling, mass and lump, head: Secondary | ICD-10-CM | POA: Diagnosis not present

## 2023-11-17 DIAGNOSIS — G936 Cerebral edema: Secondary | ICD-10-CM | POA: Diagnosis not present

## 2023-11-20 ENCOUNTER — Other Ambulatory Visit: Payer: Self-pay | Admitting: Oncology

## 2023-11-20 DIAGNOSIS — C7801 Secondary malignant neoplasm of right lung: Secondary | ICD-10-CM

## 2023-11-21 ENCOUNTER — Other Ambulatory Visit: Payer: Self-pay | Admitting: *Deleted

## 2023-11-21 ENCOUNTER — Telehealth: Payer: Self-pay | Admitting: *Deleted

## 2023-11-21 NOTE — Telephone Encounter (Signed)
 Patient is currently on a Decadron  taper that began on 4/17, per Rad Onc.  As of treatment day 5/5 she will be on day 3/5 of 1 mg tablets and will take 1/2 tablet for 5 more days.  Discussed case with Dr. Orvis Blare and Augie Bliss [facility].  It was recommended that she finish her taper as prescribed and omit the 8 mg dose 2 days post treatment.  Patient made aware and verbalized understanding.

## 2023-11-22 DIAGNOSIS — J45909 Unspecified asthma, uncomplicated: Secondary | ICD-10-CM | POA: Diagnosis not present

## 2023-11-22 DIAGNOSIS — F4310 Post-traumatic stress disorder, unspecified: Secondary | ICD-10-CM | POA: Diagnosis not present

## 2023-11-22 DIAGNOSIS — R220 Localized swelling, mass and lump, head: Secondary | ICD-10-CM | POA: Diagnosis not present

## 2023-11-22 DIAGNOSIS — Z825 Family history of asthma and other chronic lower respiratory diseases: Secondary | ICD-10-CM | POA: Diagnosis not present

## 2023-11-22 DIAGNOSIS — F419 Anxiety disorder, unspecified: Secondary | ICD-10-CM | POA: Diagnosis not present

## 2023-11-22 DIAGNOSIS — Z87891 Personal history of nicotine dependence: Secondary | ICD-10-CM | POA: Diagnosis not present

## 2023-11-22 DIAGNOSIS — J439 Emphysema, unspecified: Secondary | ICD-10-CM | POA: Diagnosis not present

## 2023-11-22 DIAGNOSIS — I7300 Raynaud's syndrome without gangrene: Secondary | ICD-10-CM | POA: Diagnosis not present

## 2023-11-22 DIAGNOSIS — G936 Cerebral edema: Secondary | ICD-10-CM | POA: Diagnosis not present

## 2023-11-22 DIAGNOSIS — F32A Depression, unspecified: Secondary | ICD-10-CM | POA: Diagnosis not present

## 2023-11-22 DIAGNOSIS — Z3202 Encounter for pregnancy test, result negative: Secondary | ICD-10-CM | POA: Diagnosis not present

## 2023-11-22 DIAGNOSIS — K219 Gastro-esophageal reflux disease without esophagitis: Secondary | ICD-10-CM | POA: Diagnosis not present

## 2023-11-22 DIAGNOSIS — C3411 Malignant neoplasm of upper lobe, right bronchus or lung: Secondary | ICD-10-CM | POA: Diagnosis not present

## 2023-11-23 DIAGNOSIS — K219 Gastro-esophageal reflux disease without esophagitis: Secondary | ICD-10-CM | POA: Diagnosis not present

## 2023-11-23 DIAGNOSIS — F4310 Post-traumatic stress disorder, unspecified: Secondary | ICD-10-CM | POA: Diagnosis not present

## 2023-11-23 DIAGNOSIS — Z3202 Encounter for pregnancy test, result negative: Secondary | ICD-10-CM | POA: Diagnosis not present

## 2023-11-23 DIAGNOSIS — J45909 Unspecified asthma, uncomplicated: Secondary | ICD-10-CM | POA: Diagnosis not present

## 2023-11-23 DIAGNOSIS — I7300 Raynaud's syndrome without gangrene: Secondary | ICD-10-CM | POA: Diagnosis not present

## 2023-11-23 DIAGNOSIS — F32A Depression, unspecified: Secondary | ICD-10-CM | POA: Diagnosis not present

## 2023-11-23 DIAGNOSIS — C3411 Malignant neoplasm of upper lobe, right bronchus or lung: Secondary | ICD-10-CM | POA: Diagnosis not present

## 2023-11-23 DIAGNOSIS — Z825 Family history of asthma and other chronic lower respiratory diseases: Secondary | ICD-10-CM | POA: Diagnosis not present

## 2023-11-23 DIAGNOSIS — J439 Emphysema, unspecified: Secondary | ICD-10-CM | POA: Diagnosis not present

## 2023-11-23 DIAGNOSIS — G936 Cerebral edema: Secondary | ICD-10-CM | POA: Diagnosis not present

## 2023-11-23 DIAGNOSIS — Z87891 Personal history of nicotine dependence: Secondary | ICD-10-CM | POA: Diagnosis not present

## 2023-11-23 DIAGNOSIS — F419 Anxiety disorder, unspecified: Secondary | ICD-10-CM | POA: Diagnosis not present

## 2023-11-23 DIAGNOSIS — R220 Localized swelling, mass and lump, head: Secondary | ICD-10-CM | POA: Diagnosis not present

## 2023-11-27 ENCOUNTER — Inpatient Hospital Stay (HOSPITAL_BASED_OUTPATIENT_CLINIC_OR_DEPARTMENT_OTHER): Admitting: Oncology

## 2023-11-27 ENCOUNTER — Inpatient Hospital Stay

## 2023-11-27 ENCOUNTER — Inpatient Hospital Stay: Attending: Hematology

## 2023-11-27 ENCOUNTER — Other Ambulatory Visit: Payer: Self-pay | Admitting: Oncology

## 2023-11-27 VITALS — BP 111/72 | HR 86 | Temp 97.7°F | Resp 18

## 2023-11-27 DIAGNOSIS — F4310 Post-traumatic stress disorder, unspecified: Secondary | ICD-10-CM | POA: Diagnosis not present

## 2023-11-27 DIAGNOSIS — C3411 Malignant neoplasm of upper lobe, right bronchus or lung: Secondary | ICD-10-CM | POA: Diagnosis not present

## 2023-11-27 DIAGNOSIS — F32A Depression, unspecified: Secondary | ICD-10-CM | POA: Diagnosis not present

## 2023-11-27 DIAGNOSIS — Z5111 Encounter for antineoplastic chemotherapy: Secondary | ICD-10-CM | POA: Diagnosis not present

## 2023-11-27 DIAGNOSIS — C7801 Secondary malignant neoplasm of right lung: Secondary | ICD-10-CM

## 2023-11-27 DIAGNOSIS — C779 Secondary and unspecified malignant neoplasm of lymph node, unspecified: Secondary | ICD-10-CM | POA: Diagnosis not present

## 2023-11-27 DIAGNOSIS — C7931 Secondary malignant neoplasm of brain: Secondary | ICD-10-CM

## 2023-11-27 DIAGNOSIS — Z79899 Other long term (current) drug therapy: Secondary | ICD-10-CM | POA: Diagnosis not present

## 2023-11-27 DIAGNOSIS — F17200 Nicotine dependence, unspecified, uncomplicated: Secondary | ICD-10-CM | POA: Diagnosis not present

## 2023-11-27 DIAGNOSIS — F419 Anxiety disorder, unspecified: Secondary | ICD-10-CM | POA: Diagnosis not present

## 2023-11-27 DIAGNOSIS — R220 Localized swelling, mass and lump, head: Secondary | ICD-10-CM | POA: Diagnosis not present

## 2023-11-27 DIAGNOSIS — Z825 Family history of asthma and other chronic lower respiratory diseases: Secondary | ICD-10-CM | POA: Diagnosis not present

## 2023-11-27 DIAGNOSIS — K219 Gastro-esophageal reflux disease without esophagitis: Secondary | ICD-10-CM | POA: Diagnosis not present

## 2023-11-27 DIAGNOSIS — Z3202 Encounter for pregnancy test, result negative: Secondary | ICD-10-CM | POA: Diagnosis not present

## 2023-11-27 DIAGNOSIS — Z87891 Personal history of nicotine dependence: Secondary | ICD-10-CM | POA: Diagnosis not present

## 2023-11-27 DIAGNOSIS — J45909 Unspecified asthma, uncomplicated: Secondary | ICD-10-CM | POA: Diagnosis not present

## 2023-11-27 DIAGNOSIS — J439 Emphysema, unspecified: Secondary | ICD-10-CM | POA: Diagnosis not present

## 2023-11-27 DIAGNOSIS — I7300 Raynaud's syndrome without gangrene: Secondary | ICD-10-CM | POA: Diagnosis not present

## 2023-11-27 DIAGNOSIS — G936 Cerebral edema: Secondary | ICD-10-CM | POA: Diagnosis not present

## 2023-11-27 LAB — CBC WITH DIFFERENTIAL/PLATELET
Abs Immature Granulocytes: 0.07 10*3/uL (ref 0.00–0.07)
Basophils Absolute: 0.1 10*3/uL (ref 0.0–0.1)
Basophils Relative: 1 %
Eosinophils Absolute: 0.1 10*3/uL (ref 0.0–0.5)
Eosinophils Relative: 1 %
HCT: 39.5 % (ref 36.0–46.0)
Hemoglobin: 13.4 g/dL (ref 12.0–15.0)
Immature Granulocytes: 1 %
Lymphocytes Relative: 7 %
Lymphs Abs: 0.7 10*3/uL (ref 0.7–4.0)
MCH: 33.3 pg (ref 26.0–34.0)
MCHC: 33.9 g/dL (ref 30.0–36.0)
MCV: 98.3 fL (ref 80.0–100.0)
Monocytes Absolute: 0.9 10*3/uL (ref 0.1–1.0)
Monocytes Relative: 10 %
Neutro Abs: 7.5 10*3/uL (ref 1.7–7.7)
Neutrophils Relative %: 80 %
Platelets: 187 10*3/uL (ref 150–400)
RBC: 4.02 MIL/uL (ref 3.87–5.11)
RDW: 14 % (ref 11.5–15.5)
WBC: 9.3 10*3/uL (ref 4.0–10.5)
nRBC: 0 % (ref 0.0–0.2)

## 2023-11-27 LAB — COMPREHENSIVE METABOLIC PANEL WITH GFR
ALT: 16 U/L (ref 0–44)
AST: 11 U/L — ABNORMAL LOW (ref 15–41)
Albumin: 3.9 g/dL (ref 3.5–5.0)
Alkaline Phosphatase: 40 U/L (ref 38–126)
Anion gap: 12 (ref 5–15)
BUN: 15 mg/dL (ref 6–20)
CO2: 25 mmol/L (ref 22–32)
Calcium: 9.4 mg/dL (ref 8.9–10.3)
Chloride: 100 mmol/L (ref 98–111)
Creatinine, Ser: 0.51 mg/dL (ref 0.44–1.00)
GFR, Estimated: >60 mL/min (ref >60)
Glucose, Bld: 101 mg/dL — ABNORMAL HIGH (ref 70–99)
Potassium: 4 mmol/L (ref 3.5–5.1)
Sodium: 137 mmol/L (ref 135–145)
Total Bilirubin: 0.4 mg/dL (ref 0.0–1.2)
Total Protein: 6.7 g/dL (ref 6.5–8.1)

## 2023-11-27 LAB — MAGNESIUM: Magnesium: 2.3 mg/dL (ref 1.7–2.4)

## 2023-11-27 MED ORDER — CETIRIZINE HCL 10 MG/ML IV SOLN
10.0000 mg | Freq: Once | INTRAVENOUS | Status: AC
  Administered 2023-11-27: 10 mg via INTRAVENOUS
  Filled 2023-11-27: qty 1

## 2023-11-27 MED ORDER — FAMOTIDINE IN NACL 20-0.9 MG/50ML-% IV SOLN
20.0000 mg | Freq: Once | INTRAVENOUS | Status: AC
  Administered 2023-11-27: 20 mg via INTRAVENOUS
  Filled 2023-11-27: qty 50

## 2023-11-27 MED ORDER — DEXAMETHASONE SODIUM PHOSPHATE 10 MG/ML IJ SOLN
8.0000 mg | Freq: Once | INTRAMUSCULAR | Status: AC
  Administered 2023-11-27: 8 mg via INTRAVENOUS
  Filled 2023-11-27: qty 1

## 2023-11-27 MED ORDER — SODIUM CHLORIDE 0.9% FLUSH
10.0000 mL | INTRAVENOUS | Status: DC | PRN
  Administered 2023-11-27: 10 mL

## 2023-11-27 MED ORDER — SODIUM CHLORIDE 0.9 % IV SOLN
270.0000 mg | Freq: Once | INTRAVENOUS | Status: AC
  Administered 2023-11-27: 270 mg via INTRAVENOUS
  Filled 2023-11-27: qty 27

## 2023-11-27 MED ORDER — HEPARIN SOD (PORK) LOCK FLUSH 100 UNIT/ML IV SOLN
500.0000 [IU] | Freq: Once | INTRAVENOUS | Status: AC | PRN
  Administered 2023-11-27: 500 [IU]

## 2023-11-27 MED ORDER — PACLITAXEL CHEMO INJECTION 300 MG/50ML
45.0000 mg/m2 | Freq: Once | INTRAVENOUS | Status: AC
  Administered 2023-11-27: 72 mg via INTRAVENOUS
  Filled 2023-11-27: qty 12

## 2023-11-27 MED ORDER — SODIUM CHLORIDE 0.9 % IV SOLN: INTRAVENOUS | Status: DC

## 2023-11-27 MED ORDER — PALONOSETRON HCL INJECTION 0.25 MG/5ML
0.2500 mg | Freq: Once | INTRAVENOUS | Status: AC
  Administered 2023-11-27: 0.25 mg via INTRAVENOUS
  Filled 2023-11-27: qty 5

## 2023-11-27 NOTE — Progress Notes (Signed)
 Patient is menopausal and declined urine pregnancy. OB/GYN status updated.  Oncologist and pharmacy aware.

## 2023-11-27 NOTE — Progress Notes (Signed)
 Patient on oral steroid taper.  Adjust dexamethasone  IV as premedication to 8 mg IV today x 1.  At home oral to 4 mg 11/28/23 and 11/29/23 then resume oral taper per RX instructions.  V.O. Dr Roberta Chin, PharmD  Pharmacist Chemotherapy Monitoring - Initial Assessment    Anticipated start date: 11/27/23   The following has been reviewed per standard work regarding the patient's treatment regimen: The patient's diagnosis, treatment plan and drug doses, and organ/hematologic function Lab orders and baseline tests specific to treatment regimen  The treatment plan start date, drug sequencing, and pre-medications Prior authorization status  Patient's documented medication list, including drug-drug interaction screen and prescriptions for anti-emetics and supportive care specific to the treatment regimen The drug concentrations, fluid compatibility, administration routes, and timing of the medications to be used The patient's access for treatment and lifetime cumulative dose history, if applicable  The patient's medication allergies and previous infusion related reactions, if applicable   Changes made to treatment plan:  MD is using actual creatinine for dosing of carboplatin.  Follow up needed:  N/A   Artie Laster, [facility], 11/27/2023  11:27 AM

## 2023-11-27 NOTE — Progress Notes (Unsigned)
 Patient Care Team: Roise Cleaver, MD as PCP - General (Family Medicine) [name], MD as Consulting Physician (Rheumatology) [name], MD as Medical Oncologist (Medical Oncology) Gerhard Knuckles, RN as Oncology Nurse Navigator (Medical Oncology) Marc Senior, MD as Consulting Physician (Pulmonary Disease)  Clinic Day:  11/28/2023  Referring physician: Roise Cleaver, MD   CHIEF COMPLAINT:  CC: Metastatic lung adenocarcinoma   ASSESSMENT & PLAN:   Assessment & Plan: Julie Horn  is a 48 y.o. 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   S/p cerebellar lesion resection consistent with lung primary.   Oncology history as below. PET scan showed limited disease to lung and lymph nodes.  Discussed at tumor board, surgery is not a feasible option secondary to lymph node involvement.  Recommended chemoradiation. Caris: PD-L1: 100%   -Started thoracic radiation today.  Will start chemotherapy today.  Labs and physical exam stable today and will proceed with chemotherapy.  Reemphasized common side effects including immunosuppression, nausea, vomiting. -Discussed that we will obtain a CT scan to assess response 4 weeks after completing chemo RT and if stable or response is better we will do immunotherapy for 1 year with durvalumab -Will coordinate care and imaging with Dr. Sulema Endo as, radiation oncologist.  Return to clinic in 1 week before second cycle of chemotherapy   Metastasis to brain [facility]) Cerebellar lesion s/p resection consistent with the poorly differentiated adenocarcinoma of lung primary. Patient completed gamma knife at [facility].  Currently on a clinical trial assessing gamma knife versus gamma tile Currently on a steroid taper that is low-dose.  - Continue steroid taper.  Clear instructions on higher dose of  steroids for 2 days after chemotherapy were given. - Continue to follow with [facility]. - MRI protocol per the study  Nicotine dependence with current use Patient quit smoking since a diagnosis of lung cancer  - Encouraged to abstain from smoking     The patient understands the plans discussed today and is in agreement with them.  She knows to contact our office if she develops concerns prior to her next appointment.  I provided 30 minutes of face-to-face time during this encounter and > 50% was spent counseling as documented under my assessment and plan.    [name], MD  [facility] [place] [facility] - A DEPT OF [facility] [address] [place] [address] Dept: [phone] Dept Fax: [phone]      ONCOLOGY HISTORY:   Oncology History  Metastatic lung carcinoma, right (HCC)  09/25/2023 Imaging   CT CAP w contrast:  1. Spiculated mass in the posterior right pulmonary apex measuring 2.6 x 1.4 cm, consistent with primary bronchogenic malignancy. 2. Enlarged right hilar lymph node measuring 1.5 x 1.2 cm, consistent with nodal metastatic disease. 3. Tiny hypodensities in the central liver, incompletely characterized, although statistically likely benign cysts or hemangiomas. Small metastases not strictly excluded. 4. Soft tissue nodule within the right gluteal subcutaneous fat;given location most likely an injection granuloma, however subcutaneous soft tissue metastases are occasionally seen in metastatic lung malignancy. Attention on follow-up.     09/25/2023 Imaging   MRI brain w wo contrast:  1. Heterogeneously contrast-enhancing mass of the right cerebellar hemisphere with a large amount of surrounding vasogenic edema. Metastasis and hemangioblastoma are the most common infratentorial tumors in adults. Astrocytoma is another possibility.  2. Mass effect on the fourth ventricle without hydrocephalus.   09/27/2023 Initial  Diagnosis   Metastatic lung carcinoma, right (HCC)   10/05/2023 PET scan   IMPRESSION: 1. Hypermetabolic RIGHT upper lobe lung nodule consistent primary bronchogenic carcinoma. 2. Mildly hypermetabolic RIGHT lower paratracheal and RIGHT hilar lymph nodes are concerning for nodal metastasis. No contralateral hypermetabolic lymph nodes. 3. No evidence of distant metastatic disease. 4. Intense activity through the distal anal canal is favored physiologic. Recommend digital rectal exam.   10/11/2023 Procedure   Resection of cerebellar metastasis   10/11/2023 Pathology Results   Diagnosis   A: Brain tumor, biopsy - Metastatic poorly differentiated adenocarcinoma consistent with lung primary.   B: Brain tumor, right, resection - Metastatic poorly differentiated adenocarcinoma consistent with lung primary.   C: Brain tumor, right, specimen trap - Metastatic poorly differentiated adenocarcinoma consistent with lung primary.     10/26/2023 Imaging   MRI brain:  Status post right occipital craniotomy for mass resection. Marked reduction in edema and mass effect, with a now normal appearance of the fourth ventricle. Blood products are present in the region of the approach and resection, with a small amount of adjacent enhancement as would be expected at this time. The study is indeterminate for residual marginal tumor and additional follow-up is warranted.   11/03/2023 Procedure   Port placement   11/12/2023 - 11/17/2023 Radiation Therapy   5 fractions of SBRT for R cerebellar bed   11/27/2023 -  Chemotherapy   Patient is on Treatment Plan : LUNG Carboplatin + Paclitaxel + XRT q7d         Current Treatment:  Chemo RT  INTERVAL HISTORY:  Julie Horn is here today for follow up.  Patient is accompanied by her long-term boyfriend today.  She is currently feeling very well.  She is currently on steroid taper after her SBRT's and is doing well with that.  Patient reports increased appetite  and weight gain since she started steroids.  Denies fatigue, shortness of breath, cough, hemoptysis.  Overall is feeling very well.  I have reviewed the past medical history, past surgical history, social history and family history with the patient and they are unchanged from previous note.  ALLERGIES:  is allergic to levofloxacin, sulfa antibiotics, azithromycin, doxycycline , latex, and wound dressing adhesive.  MEDICATIONS:  Current Outpatient Medications  Medication Sig Dispense Refill   butalbital-acetaminophen -caffeine (FIORICET) 50-325-40 MG tablet Take 1 tablet by mouth every 4 (four) hours as needed for headache.     famotidine (PEPCID) 20 MG tablet Take 20 mg by mouth daily.     loratadine (CLARITIN) 10 MG tablet Take 10 mg by mouth daily.     acetaminophen  (TYLENOL ) 500 MG tablet Take 1,000 mg by mouth every 6 (six) hours as needed for mild pain (pain score 1-3). Take 2 tablets (1,000 mg total) by mouth every six (6) hours.     albuterol  (VENTOLIN  HFA) 108 (90 Base) MCG/ACT inhaler Inhale 2 puffs into the lungs every 4 (four) hours as needed for wheezing or shortness of breath. 6.7 g 2   ascorbic acid (VITAMIN C) 500 MG tablet Take 500 mg by mouth daily. Take 1 tablet (500 mg total) by mouth daily     blood glucose meter kit and supplies 1 each by Other route as directed. Dispense based on patient and insurance preference.  glucose blood test strip Generic drug: blood sugar diagnostic Use to check blood sugar as directed with insulin 3 times a day &  for symptoms of high or low blood sugar.  lancets Misc Use to check blood sugar as directed with insulin 3 times a day & for symptoms of high or low blood sugar.     budesonide -formoterol  (SYMBICORT ) 160-4.5 MCG/ACT inhaler Inhale 2 puffs into the lungs 2 (two) times daily. Rinse mouth well after use. 1 each 11   CARBOPLATIN IV Inject into the vein.     co-enzyme Q-10 30 MG capsule Take 30 mg by mouth 3 (three) times daily. Take 1  capsule (30 mg total) by mouth Three (3) times a day.     dexamethasone  (DECADRON ) 2 MG tablet Take 2 mg by mouth daily. Take 2 tablets (4 mg total) by mouth daily for 15 days, THEN 1 tablet (2 mg total) daily for 5 days, THEN 0.5 tablets (1 mg total) daily for 5 days. Then stop.     dexamethasone  (DECADRON ) 4 MG tablet Take 2 tablets daily for 2 days, start the day after chemotherapy. Take with food. 30 tablet 1   docusate sodium (COLACE) 100 MG capsule Take 100 mg by mouth 3 (three) times daily as needed for mild constipation. Take 1 capsule (100 mg total) by mouth Three (3) times a day as needed for constipation.     guaiFENesin  (MUCINEX ) 600 MG 12 hr tablet Take 600 mg by mouth 2 (two) times daily. Take 1 tablet (600 mg total) by mouth every twelve (12) hours.     hydrOXYzine  (VISTARIL ) 25 MG capsule Take 1 capsule (25 mg total) by mouth 3 (three) times daily. For anxiety and sleep. 90 capsule 5   ibuprofen (ADVIL) 100 MG tablet Take 200-400 mg by mouth every 6 (six) hours as needed for pain.     lidocaine -prilocaine  (EMLA ) cream Apply to affected area once 30 g 3   magnesium oxide (MAG-OX) 400 (240 Mg) MG tablet Take 400 mg by mouth daily. Take 1 tablet (400 mg total) by mouth daily.     melatonin 1 MG TABS tablet Take 1 mg by mouth at bedtime. Take 5 tablets (5 mg total) by mouth nightly.     methocarbamol  (ROBAXIN ) 500 MG tablet Take 1 tablet (500 mg total) by mouth 3 (three) times daily. 60 tablet 1   montelukast  (SINGULAIR ) 10 MG tablet Take 1 tablet (10 mg total) by mouth at bedtime. (Patient taking differently: Take 10 mg by mouth daily as needed (allergies).) 90 tablet 3   omeprazole (PRILOSEC) 20 MG capsule Take 20 mg by mouth daily.     ondansetron  (ZOFRAN ) 8 MG tablet Take 1 tablet (8 mg total) by mouth every 8 (eight) hours as needed for nausea or vomiting. Start on the third day after chemotherapy. 30 tablet 1   ondansetron  (ZOFRAN -ODT) 4 MG disintegrating tablet Take 1 tablet (4 mg  total) by mouth every 8 (eight) hours as needed for nausea or vomiting. 30 tablet 5   PACLitaxel (TAXOL IV) Inject into the vein.     Palonosetron HCl (ALOXI IV) Inject into the vein.     prochlorperazine  (COMPAZINE ) 10 MG tablet Take 1 tablet (10 mg total) by mouth every 6 (six) hours as needed for nausea or vomiting. 30 tablet 1   sennosides-docusate sodium (SENOKOT-S) 8.6-50 MG tablet Take 2 tablets by mouth daily.     tizanidine (ZANAFLEX) 2 MG capsule Take 2 mg by mouth every 6 (six) hours as needed for muscle spasms. Take 1 tablet (2 mg total) by mouth every six (6) hours as needed (neck pain).  traMADol  (ULTRAM ) 50 MG tablet Take 50 mg by mouth every 6 (six) hours as needed.     traZODone  (DESYREL ) 150 MG tablet USE FROM 1/3 TO 1 TABLET NIGHTLY AS NEEDED FOR SLEEP. 90 tablet 1   No current facility-administered medications for this visit.    REVIEW OF SYSTEMS:   Constitutional: Denies fevers, chills or abnormal weight loss Eyes: Denies blurriness of vision Ears, nose, mouth, throat, and face: Denies mucositis or sore throat Respiratory: Denies cough, dyspnea or wheezes Cardiovascular: Denies palpitation, chest discomfort or lower extremity swelling Gastrointestinal:  Denies nausea, heartburn or change in bowel habits Skin: Denies abnormal skin rashes Lymphatics: Denies new lymphadenopathy or easy bruising Neurological:Denies numbness, tingling or new weaknesses Behavioral/Psych: Mood is stable, no new changes  All other systems were reviewed with the patient and are negative.   VITALS:  Last menstrual period 07/26/2019.  Wt Readings from Last 3 Encounters:  11/27/23 139 lb 15.9 oz (63.5 kg)  11/03/23 134 lb (60.8 kg)  11/02/23 134 lb 12.8 oz (61.1 kg)    There is no height or weight on file to calculate BMI.  Performance status (ECOG): 0 - Asymptomatic  PHYSICAL EXAM:   GENERAL:alert, no distress and comfortable LYMPH:  no palpable lymphadenopathy in the cervical,  axillary or inguinal LUNGS: clear to auscultation and percussion with normal breathing effort HEART: regular rate & rhythm and no murmurs and no lower extremity edema ABDOMEN:abdomen soft, non-tender and normal bowel sounds Musculoskeletal:no cyanosis of digits and no clubbing  NEURO: alert & oriented x 3 with fluent speech.  Postsurgical scar on the right occipital region.  LABORATORY DATA:  I have reviewed the data as listed  Lab Results  Component Value Date   WBC 9.3 11/27/2023   NEUTROABS 7.5 11/27/2023   HGB 13.4 11/27/2023   HCT 39.5 11/27/2023   MCV 98.3 11/27/2023   PLT 187 11/27/2023      Chemistry      Component Value Date/Time   NA 137 11/27/2023 0946   NA 145 (H) 05/22/2023 1054   K 4.0 11/27/2023 0946   CL 100 11/27/2023 0946   CO2 25 11/27/2023 0946   BUN 15 11/27/2023 0946   BUN 7 05/22/2023 1054   CREATININE 0.51 11/27/2023 0946   GLU 83 08/25/2015 0000      Component Value Date/Time   CALCIUM 9.4 11/27/2023 0946   ALKPHOS 40 11/27/2023 0946   AST 11 (L) 11/27/2023 0946   ALT 16 11/27/2023 0946   BILITOT 0.4 11/27/2023 0946   BILITOT 0.5 05/22/2023 1054       RADIOGRAPHIC STUDIES: None

## 2023-11-27 NOTE — Progress Notes (Signed)
 Patient presents today for chemotherapy infusion. Patient is in satisfactory condition with no new complaints voiced.  Vital signs are stable.  Labs reviewed by Dr. Orvis Blare during the office visit and all labs are within treatment parameters.  We will proceed with treatment per MD orders.   Patient tolerated treatment well with no complaints voiced.  Patient left ambulatory in stable condition.  Vital signs stable at discharge.  Follow up as scheduled.

## 2023-11-27 NOTE — Patient Instructions (Signed)
 [facility] - A DEPT OF [facility]  Discharge Instructions: Thank you for choosing [facility] to provide your oncology and hematology care.  If you have a lab appointment with the Cancer Center - please note that after April 8th, 2024, all labs will be drawn in the cancer center.  You do not have to check in or register with the main entrance as you have in the past but will complete your check-in in the cancer center.  Wear comfortable clothing and clothing appropriate for easy access to any Portacath or PICC line.   We strive to give you quality time with your provider. You may need to reschedule your appointment if you arrive late (15 or more minutes).  Arriving late affects you and other patients whose appointments are after yours.  Also, if you miss three or more appointments without notifying the office, you may be dismissed from the clinic at the provider's discretion.      For prescription refill requests, have your pharmacy contact our office and allow 72 hours for refills to be completed.    Today you received the following chemotherapy and/or immunotherapy agents Taxol/Carboplatin.  Paclitaxel Injection What is this medication? PACLITAXEL (PAK li TAX el) treats some types of cancer. It works by slowing down the growth of cancer cells. This medicine may be used for other purposes; ask your health care provider or pharmacist if you have questions. COMMON BRAND NAME(S): Onxol, Taxol What should I tell my care team before I take this medication? They need to know if you have any of these conditions: Heart disease Liver disease Low white blood cell levels An unusual or allergic reaction to paclitaxel, other medications, foods, dyes, or preservatives If you or your partner are pregnant or trying to get pregnant Breast-feeding How should I use this medication? This medication is injected into a vein. It is given by your care team in a  hospital or clinic setting. Talk to your care team about the use of this medication in children. While it may be given to children for selected conditions, precautions do apply. Overdosage: If you think you have taken too much of this medicine contact a poison control center or emergency room at once. NOTE: This medicine is only for you. Do not share this medicine with others. What if I miss a dose? Keep appointments for follow-up doses. It is important not to miss your dose. Call your care team if you are unable to keep an appointment. What may interact with this medication? Do not take this medication with any of the following: Live virus vaccines Other medications may affect the way this medication works. Talk with your care team about all of the medications you take. They may suggest changes to your treatment plan to lower the risk of side effects and to make sure your medications work as intended. This list may not describe all possible interactions. Give your health care provider a list of all the medicines, herbs, non-prescription drugs, or dietary supplements you use. Also tell them if you smoke, drink alcohol, or use illegal drugs. Some items may interact with your medicine. What should I watch for while using this medication? Your condition will be monitored carefully while you are receiving this medication. You may need blood work while taking this medication. This medication may make you feel generally unwell. This is not uncommon as chemotherapy can affect healthy cells as well as cancer cells. Report any  side effects. Continue your course of treatment even though you feel ill unless your care team tells you to stop. This medication can cause serious allergic reactions. To reduce the risk, your care team may give you other medications to take before receiving this one. Be sure to follow the directions from your care team. This medication may increase your risk of getting an infection.  Call your care team for advice if you get a fever, chills, sore throat, or other symptoms of a cold or flu. Do not treat yourself. Try to avoid being around people who are sick. This medication may increase your risk to bruise or bleed. Call your care team if you notice any unusual bleeding. Be careful brushing or flossing your teeth or using a toothpick because you may get an infection or bleed more easily. If you have any dental work done, tell your dentist you are receiving this medication. Talk to your care team if you may be pregnant. Serious birth defects can occur if you take this medication during pregnancy. Talk to your care team before breastfeeding. Changes to your treatment plan may be needed. What side effects may I notice from receiving this medication? Side effects that you should report to your care team as soon as possible: Allergic reactions--skin rash, itching, hives, swelling of the face, lips, tongue, or throat Heart rhythm changes--fast or irregular heartbeat, dizziness, feeling faint or lightheaded, chest pain, trouble breathing Increase in blood pressure Infection--fever, chills, cough, sore throat, wounds that don't heal, pain or trouble when passing urine, general feeling of discomfort or being unwell Low blood pressure--dizziness, feeling faint or lightheaded, blurry vision Low red blood cell level--unusual weakness or fatigue, dizziness, headache, trouble breathing Painful swelling, warmth, or redness of the skin, blisters or sores at the infusion site Pain, tingling, or numbness in the hands or feet Slow heartbeat--dizziness, feeling faint or lightheaded, confusion, trouble breathing, unusual weakness or fatigue Unusual bruising or bleeding Side effects that usually do not require medical attention (report to your care team if they continue or are bothersome): Diarrhea Hair loss Joint pain Loss of appetite Muscle pain Nausea Vomiting This list may not describe  all possible side effects. Call your doctor for medical advice about side effects. You may report side effects to FDA at 1-800-FDA-1088. Where should I keep my medication? This medication is given in a hospital or clinic. It will not be stored at home. NOTE: This sheet is a summary. It may not cover all possible information. If you have questions about this medicine, talk to your doctor, pharmacist, or health care provider.  2024 Elsevier/Gold Standard (2021-11-30 00:00:00)   Carboplatin Injection What is this medication? CARBOPLATIN (KAR boe pla tin) treats some types of cancer. It works by slowing down the growth of cancer cells. This medicine may be used for other purposes; ask your health care provider or pharmacist if you have questions. COMMON BRAND NAME(S): Paraplatin What should I tell my care team before I take this medication? They need to know if you have any of these conditions: Blood disorders Hearing problems Kidney disease Recent or ongoing radiation therapy An unusual or allergic reaction to carboplatin, cisplatin, other medications, foods, dyes, or preservatives Pregnant or trying to get pregnant Breast-feeding How should I use this medication? This medication is injected into a vein. It is given by your care team in a hospital or clinic setting. Talk to your care team about the use of this medication in children. Special care may be  needed. Overdosage: If you think you have taken too much of this medicine contact a poison control center or emergency room at once. NOTE: This medicine is only for you. Do not share this medicine with others. What if I miss a dose? Keep appointments for follow-up doses. It is important not to miss your dose. Call your care team if you are unable to keep an appointment. What may interact with this medication? Medications for seizures Some antibiotics, such as amikacin, gentamicin, neomycin, streptomycin, tobramycin Vaccines This list may  not describe all possible interactions. Give your health care provider a list of all the medicines, herbs, non-prescription drugs, or dietary supplements you use. Also tell them if you smoke, drink alcohol, or use illegal drugs. Some items may interact with your medicine. What should I watch for while using this medication? Your condition will be monitored carefully while you are receiving this medication. You may need blood work while taking this medication. This medication may make you feel generally unwell. This is not uncommon, as chemotherapy can affect healthy cells as well as cancer cells. Report any side effects. Continue your course of treatment even though you feel ill unless your care team tells you to stop. In some cases, you may be given additional medications to help with side effects. Follow all directions for their use. This medication may increase your risk of getting an infection. Call your care team for advice if you get a fever, chills, sore throat, or other symptoms of a cold or flu. Do not treat yourself. Try to avoid being around people who are sick. Avoid taking medications that contain aspirin, acetaminophen, ibuprofen, naproxen, or ketoprofen unless instructed by your care team. These medications may hide a fever. Be careful brushing or flossing your teeth or using a toothpick because you may get an infection or bleed more easily. If you have any dental work done, tell your dentist you are receiving this medication. Talk to your care team if you wish to become pregnant or think you might be pregnant. This medication can cause serious birth defects. Talk to your care team about effective forms of contraception. Do not breast-feed while taking this medication. What side effects may I notice from receiving this medication? Side effects that you should report to your care team as soon as possible: Allergic reactions--skin rash, itching, hives, swelling of the face, lips, tongue, or  throat Infection--fever, chills, cough, sore throat, wounds that don't heal, pain or trouble when passing urine, general feeling of discomfort or being unwell Low red blood cell level--unusual weakness or fatigue, dizziness, headache, trouble breathing Pain, tingling, or numbness in the hands or feet, muscle weakness, change in vision, confusion or trouble speaking, loss of balance or coordination, trouble walking, seizures Unusual bruising or bleeding Side effects that usually do not require medical attention (report to your care team if they continue or are bothersome): Hair loss Nausea Unusual weakness or fatigue Vomiting This list may not describe all possible side effects. Call your doctor for medical advice about side effects. You may report side effects to FDA at 1-800-FDA-1088. Where should I keep my medication? This medication is given in a hospital or clinic. It will not be stored at home. NOTE: This sheet is a summary. It may not cover all possible information. If you have questions about this medicine, talk to your doctor, pharmacist, or health care provider.  2024 Elsevier/Gold Standard (2021-11-02 00:00:00)       To help prevent nausea and vomiting  after your treatment, we encourage you to take your nausea medication as directed.  BELOW ARE SYMPTOMS THAT SHOULD BE REPORTED IMMEDIATELY: *FEVER GREATER THAN 100.4 F (38 C) OR HIGHER *CHILLS OR SWEATING *NAUSEA AND VOMITING THAT IS NOT CONTROLLED WITH YOUR NAUSEA MEDICATION *UNUSUAL SHORTNESS OF BREATH *UNUSUAL BRUISING OR BLEEDING *URINARY PROBLEMS (pain or burning when urinating, or frequent urination) *BOWEL PROBLEMS (unusual diarrhea, constipation, pain near the anus) TENDERNESS IN MOUTH AND THROAT WITH OR WITHOUT PRESENCE OF ULCERS (sore throat, sores in mouth, or a toothache) UNUSUAL RASH, SWELLING OR PAIN  UNUSUAL VAGINAL DISCHARGE OR ITCHING   Items with * indicate a potential emergency and should be followed up as  soon as possible or go to the Emergency Department if any problems should occur.  Please show the CHEMOTHERAPY ALERT CARD or IMMUNOTHERAPY ALERT CARD at check-in to the Emergency Department and triage nurse.  Should you have questions after your visit or need to cancel or reschedule your appointment, please contact [facility]  - A DEPT OF [facility] [phone]  and follow the prompts.  Office hours are 8:00 a.m. to 4:30 p.m. Monday - Friday. Please note that voicemails left after 4:00 p.m. may not be returned until the following business day.  We are closed weekends and major holidays. You have access to a nurse at all times for urgent questions. Please call the main number to the clinic [phone] and follow the prompts.  For any non-urgent questions, you may also contact your provider using MyChart. We now offer e-Visits for anyone 27 and older to request care online for non-urgent symptoms. For details visit [email or web address].   Also download the MyChart app! Go to the app store, search "MyChart", open the app, select [place], and log in with your MyChart username and password.

## 2023-11-28 ENCOUNTER — Encounter: Payer: Self-pay | Admitting: Oncology

## 2023-11-28 DIAGNOSIS — F32A Depression, unspecified: Secondary | ICD-10-CM | POA: Diagnosis not present

## 2023-11-28 DIAGNOSIS — C3411 Malignant neoplasm of upper lobe, right bronchus or lung: Secondary | ICD-10-CM | POA: Diagnosis not present

## 2023-11-28 DIAGNOSIS — K219 Gastro-esophageal reflux disease without esophagitis: Secondary | ICD-10-CM | POA: Diagnosis not present

## 2023-11-28 DIAGNOSIS — I7300 Raynaud's syndrome without gangrene: Secondary | ICD-10-CM | POA: Diagnosis not present

## 2023-11-28 DIAGNOSIS — J45909 Unspecified asthma, uncomplicated: Secondary | ICD-10-CM | POA: Diagnosis not present

## 2023-11-28 DIAGNOSIS — Z3202 Encounter for pregnancy test, result negative: Secondary | ICD-10-CM | POA: Diagnosis not present

## 2023-11-28 DIAGNOSIS — F4310 Post-traumatic stress disorder, unspecified: Secondary | ICD-10-CM | POA: Diagnosis not present

## 2023-11-28 DIAGNOSIS — Z825 Family history of asthma and other chronic lower respiratory diseases: Secondary | ICD-10-CM | POA: Diagnosis not present

## 2023-11-28 DIAGNOSIS — F419 Anxiety disorder, unspecified: Secondary | ICD-10-CM | POA: Diagnosis not present

## 2023-11-28 DIAGNOSIS — J439 Emphysema, unspecified: Secondary | ICD-10-CM | POA: Diagnosis not present

## 2023-11-28 DIAGNOSIS — Z87891 Personal history of nicotine dependence: Secondary | ICD-10-CM | POA: Diagnosis not present

## 2023-11-28 DIAGNOSIS — R220 Localized swelling, mass and lump, head: Secondary | ICD-10-CM | POA: Diagnosis not present

## 2023-11-28 DIAGNOSIS — G936 Cerebral edema: Secondary | ICD-10-CM | POA: Diagnosis not present

## 2023-11-28 NOTE — Telephone Encounter (Signed)
 Chemotherapy 24 hour follow up.  No answer and no voice mail.

## 2023-11-28 NOTE — Assessment & Plan Note (Signed)
 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 Patient completed gamma knife at [facility].  Currently on a clinical trial assessing gamma knife versus gamma tile Currently on a steroid taper that is low-dose.  - Continue steroid taper.  Clear instructions on higher dose of steroids for 2 days after chemotherapy were given. - Continue to follow with [facility]. - MRI protocol per the study

## 2023-11-28 NOTE — Assessment & Plan Note (Signed)
 Patient quit smoking since a diagnosis of lung cancer  - Encouraged to abstain from smoking

## 2023-11-28 NOTE — Assessment & Plan Note (Addendum)
 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   S/p cerebellar lesion resection consistent with lung primary.   Oncology history as below. PET scan showed limited disease to lung and lymph nodes.  Discussed at tumor board, surgery is not a feasible option secondary to lymph node involvement.  Recommended chemoradiation. Caris: PD-L1: 100%   -Started thoracic radiation today.  Will start chemotherapy today.  Labs and physical exam stable today and will proceed with chemotherapy.  Reemphasized common side effects including immunosuppression, nausea, vomiting. -Discussed that we will obtain a CT scan to assess response 4 weeks after completing chemo RT and if stable or response is better we will do immunotherapy for 1 year with durvalumab -Will coordinate care and imaging with Dr. Sulema Endo as, radiation oncologist.  Return to clinic in 1 week before second cycle of chemotherapy

## 2023-11-29 DIAGNOSIS — J45909 Unspecified asthma, uncomplicated: Secondary | ICD-10-CM | POA: Diagnosis not present

## 2023-11-29 DIAGNOSIS — R220 Localized swelling, mass and lump, head: Secondary | ICD-10-CM | POA: Diagnosis not present

## 2023-11-29 DIAGNOSIS — G936 Cerebral edema: Secondary | ICD-10-CM | POA: Diagnosis not present

## 2023-11-29 DIAGNOSIS — Z87891 Personal history of nicotine dependence: Secondary | ICD-10-CM | POA: Diagnosis not present

## 2023-11-29 DIAGNOSIS — I7300 Raynaud's syndrome without gangrene: Secondary | ICD-10-CM | POA: Diagnosis not present

## 2023-11-29 DIAGNOSIS — F32A Depression, unspecified: Secondary | ICD-10-CM | POA: Diagnosis not present

## 2023-11-29 DIAGNOSIS — C3411 Malignant neoplasm of upper lobe, right bronchus or lung: Secondary | ICD-10-CM | POA: Diagnosis not present

## 2023-11-29 DIAGNOSIS — J439 Emphysema, unspecified: Secondary | ICD-10-CM | POA: Diagnosis not present

## 2023-11-29 DIAGNOSIS — Z3202 Encounter for pregnancy test, result negative: Secondary | ICD-10-CM | POA: Diagnosis not present

## 2023-11-29 DIAGNOSIS — F4310 Post-traumatic stress disorder, unspecified: Secondary | ICD-10-CM | POA: Diagnosis not present

## 2023-11-29 DIAGNOSIS — Z825 Family history of asthma and other chronic lower respiratory diseases: Secondary | ICD-10-CM | POA: Diagnosis not present

## 2023-11-29 DIAGNOSIS — K219 Gastro-esophageal reflux disease without esophagitis: Secondary | ICD-10-CM | POA: Diagnosis not present

## 2023-11-29 DIAGNOSIS — F419 Anxiety disorder, unspecified: Secondary | ICD-10-CM | POA: Diagnosis not present

## 2023-11-30 DIAGNOSIS — I7300 Raynaud's syndrome without gangrene: Secondary | ICD-10-CM | POA: Diagnosis not present

## 2023-11-30 DIAGNOSIS — C3411 Malignant neoplasm of upper lobe, right bronchus or lung: Secondary | ICD-10-CM | POA: Diagnosis not present

## 2023-11-30 DIAGNOSIS — F4310 Post-traumatic stress disorder, unspecified: Secondary | ICD-10-CM | POA: Diagnosis not present

## 2023-11-30 DIAGNOSIS — Z87891 Personal history of nicotine dependence: Secondary | ICD-10-CM | POA: Diagnosis not present

## 2023-11-30 DIAGNOSIS — G936 Cerebral edema: Secondary | ICD-10-CM | POA: Diagnosis not present

## 2023-11-30 DIAGNOSIS — J45909 Unspecified asthma, uncomplicated: Secondary | ICD-10-CM | POA: Diagnosis not present

## 2023-11-30 DIAGNOSIS — R220 Localized swelling, mass and lump, head: Secondary | ICD-10-CM | POA: Diagnosis not present

## 2023-11-30 DIAGNOSIS — Z825 Family history of asthma and other chronic lower respiratory diseases: Secondary | ICD-10-CM | POA: Diagnosis not present

## 2023-11-30 DIAGNOSIS — F32A Depression, unspecified: Secondary | ICD-10-CM | POA: Diagnosis not present

## 2023-11-30 DIAGNOSIS — K219 Gastro-esophageal reflux disease without esophagitis: Secondary | ICD-10-CM | POA: Diagnosis not present

## 2023-11-30 DIAGNOSIS — Z3202 Encounter for pregnancy test, result negative: Secondary | ICD-10-CM | POA: Diagnosis not present

## 2023-11-30 DIAGNOSIS — F419 Anxiety disorder, unspecified: Secondary | ICD-10-CM | POA: Diagnosis not present

## 2023-11-30 DIAGNOSIS — J439 Emphysema, unspecified: Secondary | ICD-10-CM | POA: Diagnosis not present

## 2023-12-01 DIAGNOSIS — G936 Cerebral edema: Secondary | ICD-10-CM | POA: Diagnosis not present

## 2023-12-01 DIAGNOSIS — F419 Anxiety disorder, unspecified: Secondary | ICD-10-CM | POA: Diagnosis not present

## 2023-12-01 DIAGNOSIS — F4310 Post-traumatic stress disorder, unspecified: Secondary | ICD-10-CM | POA: Diagnosis not present

## 2023-12-01 DIAGNOSIS — R220 Localized swelling, mass and lump, head: Secondary | ICD-10-CM | POA: Diagnosis not present

## 2023-12-01 DIAGNOSIS — Z87891 Personal history of nicotine dependence: Secondary | ICD-10-CM | POA: Diagnosis not present

## 2023-12-01 DIAGNOSIS — C3411 Malignant neoplasm of upper lobe, right bronchus or lung: Secondary | ICD-10-CM | POA: Diagnosis not present

## 2023-12-01 DIAGNOSIS — J45909 Unspecified asthma, uncomplicated: Secondary | ICD-10-CM | POA: Diagnosis not present

## 2023-12-01 DIAGNOSIS — I7300 Raynaud's syndrome without gangrene: Secondary | ICD-10-CM | POA: Diagnosis not present

## 2023-12-01 DIAGNOSIS — F32A Depression, unspecified: Secondary | ICD-10-CM | POA: Diagnosis not present

## 2023-12-01 DIAGNOSIS — J439 Emphysema, unspecified: Secondary | ICD-10-CM | POA: Diagnosis not present

## 2023-12-01 DIAGNOSIS — K219 Gastro-esophageal reflux disease without esophagitis: Secondary | ICD-10-CM | POA: Diagnosis not present

## 2023-12-01 DIAGNOSIS — Z3202 Encounter for pregnancy test, result negative: Secondary | ICD-10-CM | POA: Diagnosis not present

## 2023-12-01 DIAGNOSIS — Z825 Family history of asthma and other chronic lower respiratory diseases: Secondary | ICD-10-CM | POA: Diagnosis not present

## 2023-12-04 ENCOUNTER — Inpatient Hospital Stay: Admitting: Dietician

## 2023-12-04 ENCOUNTER — Inpatient Hospital Stay (HOSPITAL_BASED_OUTPATIENT_CLINIC_OR_DEPARTMENT_OTHER): Admitting: Oncology

## 2023-12-04 ENCOUNTER — Inpatient Hospital Stay

## 2023-12-04 ENCOUNTER — Inpatient Hospital Stay: Admitting: Oncology

## 2023-12-04 VITALS — BP 97/69 | HR 88 | Temp 98.3°F | Resp 18

## 2023-12-04 VITALS — BP 117/71

## 2023-12-04 DIAGNOSIS — C7801 Secondary malignant neoplasm of right lung: Secondary | ICD-10-CM

## 2023-12-04 DIAGNOSIS — J439 Emphysema, unspecified: Secondary | ICD-10-CM | POA: Diagnosis not present

## 2023-12-04 DIAGNOSIS — C7931 Secondary malignant neoplasm of brain: Secondary | ICD-10-CM

## 2023-12-04 DIAGNOSIS — L905 Scar conditions and fibrosis of skin: Secondary | ICD-10-CM | POA: Insufficient documentation

## 2023-12-04 DIAGNOSIS — Z79899 Other long term (current) drug therapy: Secondary | ICD-10-CM | POA: Diagnosis not present

## 2023-12-04 DIAGNOSIS — R220 Localized swelling, mass and lump, head: Secondary | ICD-10-CM | POA: Diagnosis not present

## 2023-12-04 DIAGNOSIS — Z5111 Encounter for antineoplastic chemotherapy: Secondary | ICD-10-CM | POA: Diagnosis not present

## 2023-12-04 DIAGNOSIS — C779 Secondary and unspecified malignant neoplasm of lymph node, unspecified: Secondary | ICD-10-CM | POA: Diagnosis not present

## 2023-12-04 DIAGNOSIS — C3411 Malignant neoplasm of upper lobe, right bronchus or lung: Secondary | ICD-10-CM | POA: Diagnosis not present

## 2023-12-04 DIAGNOSIS — F17200 Nicotine dependence, unspecified, uncomplicated: Secondary | ICD-10-CM | POA: Diagnosis not present

## 2023-12-04 DIAGNOSIS — J45909 Unspecified asthma, uncomplicated: Secondary | ICD-10-CM | POA: Diagnosis not present

## 2023-12-04 DIAGNOSIS — F32A Depression, unspecified: Secondary | ICD-10-CM | POA: Diagnosis not present

## 2023-12-04 DIAGNOSIS — I7300 Raynaud's syndrome without gangrene: Secondary | ICD-10-CM | POA: Diagnosis not present

## 2023-12-04 DIAGNOSIS — F419 Anxiety disorder, unspecified: Secondary | ICD-10-CM | POA: Diagnosis not present

## 2023-12-04 DIAGNOSIS — F4310 Post-traumatic stress disorder, unspecified: Secondary | ICD-10-CM | POA: Diagnosis not present

## 2023-12-04 DIAGNOSIS — Z87891 Personal history of nicotine dependence: Secondary | ICD-10-CM | POA: Diagnosis not present

## 2023-12-04 DIAGNOSIS — Z825 Family history of asthma and other chronic lower respiratory diseases: Secondary | ICD-10-CM | POA: Diagnosis not present

## 2023-12-04 DIAGNOSIS — Z3202 Encounter for pregnancy test, result negative: Secondary | ICD-10-CM | POA: Diagnosis not present

## 2023-12-04 DIAGNOSIS — G936 Cerebral edema: Secondary | ICD-10-CM | POA: Diagnosis not present

## 2023-12-04 DIAGNOSIS — K219 Gastro-esophageal reflux disease without esophagitis: Secondary | ICD-10-CM | POA: Diagnosis not present

## 2023-12-04 LAB — CBC WITH DIFFERENTIAL/PLATELET
Abs Immature Granulocytes: 0.05 10*3/uL (ref 0.00–0.07)
Basophils Absolute: 0 10*3/uL (ref 0.0–0.1)
Basophils Relative: 0 %
Eosinophils Absolute: 0.2 10*3/uL (ref 0.0–0.5)
Eosinophils Relative: 2 %
HCT: 38.5 % (ref 36.0–46.0)
Hemoglobin: 13 g/dL (ref 12.0–15.0)
Immature Granulocytes: 1 %
Lymphocytes Relative: 11 %
Lymphs Abs: 0.8 10*3/uL (ref 0.7–4.0)
MCH: 33.1 pg (ref 26.0–34.0)
MCHC: 33.8 g/dL (ref 30.0–36.0)
MCV: 98 fL (ref 80.0–100.0)
Monocytes Absolute: 0.5 10*3/uL (ref 0.1–1.0)
Monocytes Relative: 7 %
Neutro Abs: 5.7 10*3/uL (ref 1.7–7.7)
Neutrophils Relative %: 79 %
Platelets: 193 10*3/uL (ref 150–400)
RBC: 3.93 MIL/uL (ref 3.87–5.11)
RDW: 13.4 % (ref 11.5–15.5)
WBC: 7.3 10*3/uL (ref 4.0–10.5)
nRBC: 0 % (ref 0.0–0.2)

## 2023-12-04 LAB — COMPREHENSIVE METABOLIC PANEL WITH GFR
ALT: 15 U/L (ref 0–44)
AST: 14 U/L — ABNORMAL LOW (ref 15–41)
Albumin: 3.7 g/dL (ref 3.5–5.0)
Alkaline Phosphatase: 45 U/L (ref 38–126)
Anion gap: 10 (ref 5–15)
BUN: 13 mg/dL (ref 6–20)
CO2: 26 mmol/L (ref 22–32)
Calcium: 9.3 mg/dL (ref 8.9–10.3)
Chloride: 100 mmol/L (ref 98–111)
Creatinine, Ser: 0.49 mg/dL (ref 0.44–1.00)
GFR, Estimated: >60 mL/min (ref >60)
Glucose, Bld: 100 mg/dL — ABNORMAL HIGH (ref 70–99)
Potassium: 3.8 mmol/L (ref 3.5–5.1)
Sodium: 136 mmol/L (ref 135–145)
Total Bilirubin: 0.4 mg/dL (ref 0.0–1.2)
Total Protein: 6.7 g/dL (ref 6.5–8.1)

## 2023-12-04 MED ORDER — HEPARIN SOD (PORK) LOCK FLUSH 100 UNIT/ML IV SOLN
500.0000 [IU] | Freq: Once | INTRAVENOUS | Status: AC | PRN
  Administered 2023-12-04: 500 [IU]

## 2023-12-04 MED ORDER — PALONOSETRON HCL INJECTION 0.25 MG/5ML
0.2500 mg | Freq: Once | INTRAVENOUS | Status: AC
  Administered 2023-12-04: 0.25 mg via INTRAVENOUS
  Filled 2023-12-04: qty 5

## 2023-12-04 MED ORDER — SODIUM CHLORIDE 0.9% FLUSH
10.0000 mL | INTRAVENOUS | Status: DC | PRN
  Administered 2023-12-04: 10 mL

## 2023-12-04 MED ORDER — CETIRIZINE HCL 10 MG/ML IV SOLN
10.0000 mg | Freq: Once | INTRAVENOUS | Status: AC
  Administered 2023-12-04: 10 mg via INTRAVENOUS
  Filled 2023-12-04: qty 1

## 2023-12-04 MED ORDER — DEXAMETHASONE SODIUM PHOSPHATE 10 MG/ML IJ SOLN
10.0000 mg | Freq: Once | INTRAMUSCULAR | Status: AC
  Administered 2023-12-04: 10 mg via INTRAVENOUS
  Filled 2023-12-04: qty 1

## 2023-12-04 MED ORDER — SODIUM CHLORIDE 0.9% FLUSH
10.0000 mL | Freq: Once | INTRAVENOUS | Status: AC
  Administered 2023-12-04: 10 mL via INTRAVENOUS

## 2023-12-04 MED ORDER — FAMOTIDINE IN NACL 20-0.9 MG/50ML-% IV SOLN
20.0000 mg | Freq: Once | INTRAVENOUS | Status: AC
  Administered 2023-12-04: 20 mg via INTRAVENOUS
  Filled 2023-12-04: qty 50

## 2023-12-04 MED ORDER — SODIUM CHLORIDE 0.9 % IV SOLN
INTRAVENOUS | Status: DC
Start: 2023-12-04 — End: 2023-12-04

## 2023-12-04 MED ORDER — SODIUM CHLORIDE 0.9 % IV SOLN
270.0000 mg | Freq: Once | INTRAVENOUS | Status: AC
  Administered 2023-12-04: 270 mg via INTRAVENOUS
  Filled 2023-12-04: qty 27

## 2023-12-04 MED ORDER — SODIUM CHLORIDE 0.9 % IV SOLN
45.0000 mg/m2 | Freq: Once | INTRAVENOUS | Status: AC
  Administered 2023-12-04: 72 mg via INTRAVENOUS
  Filled 2023-12-04: qty 12

## 2023-12-04 NOTE — Assessment & Plan Note (Signed)
 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 Patient completed gamma knife at [facility].  Currently on a clinical trial assessing gamma knife versus gamma tile Currently on a steroid taper that is low-dose.  - Completed steroid taper - Continue to follow with [facility]. - MRI protocol per the study

## 2023-12-04 NOTE — Progress Notes (Signed)
 Patient Care Team: Roise Cleaver, MD as PCP - General (Family Medicine) [name], MD as Consulting Physician (Rheumatology) [name], MD as Medical Oncologist (Medical Oncology) Gerhard Knuckles, RN as Oncology Nurse Navigator (Medical Oncology) Marc Senior, MD as Consulting Physician (Pulmonary Disease)  Clinic Day:  12/04/2023  Referring physician: Roise Cleaver, MD   CHIEF COMPLAINT:  CC: Metastatic lung adenocarcinoma   ASSESSMENT & PLAN:   Assessment & Plan: Julie Horn  is a 48 y.o. 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   S/p cerebellar lesion resection consistent with lung primary.   Oncology history as below. PET scan showed limited disease to lung and lymph nodes.  Discussed at tumor board, surgery is not a feasible option secondary to lymph node involvement.  Recommended chemoradiation. Caris: PD-L1: 100%   -Started thoracic radiation today.  C2D1 today.  Tolerated the first cycle well with some nausea.  Labs and physical exam stable today and will proceed with chemotherapy.  Reemphasized common side effects including immunosuppression, nausea, vomiting. -For nausea please take Compazine  as prescribed.  Zofran  can be taken after day 3.  Will hold off on steroids at this time -Discussed that we will obtain a CT scan to assess response 4 weeks after completing chemo RT and if stable or response is better we will do immunotherapy for 1 year with durvalumab -Will coordinate care and imaging with Dr. Sulema Endo as, radiation oncologist.  Return to clinic in 1 week before third cycle of chemotherapy   Metastasis to brain [facility]) Cerebellar lesion s/p resection consistent with the poorly differentiated adenocarcinoma of lung primary. Patient completed gamma knife at [facility].  Currently on a clinical trial  assessing gamma knife versus gamma tile Currently on a steroid taper that is low-dose.  - Completed steroid taper - Continue to follow with [facility]. - MRI protocol per the study  Nicotine dependence with current use Patient quit smoking since a diagnosis of lung cancer  - Encouraged to abstain from smoking  Scar pain Scar from port placement is irritated and tender.  No signs of infection.   - Apply silicone patches to the scar. - Use Tylenol  for pain management as needed. - Re-evaluate next week if symptoms persist or worsen.   The patient understands the plans discussed today and is in agreement with them.  She knows to contact our office if she develops concerns prior to her next appointment.  I provided 30 minutes of face-to-face time during this encounter and > 50% was spent counseling as documented under my assessment and plan.    [name], MD  [facility] [place] - A DEPT OF [facility] [address] [place] [address] Dept: [phone] Dept Fax: [phone]      ONCOLOGY HISTORY:   Oncology History  Metastatic lung carcinoma, right (HCC)  09/25/2023 Imaging   CT CAP w contrast:  1. Spiculated mass in the posterior right pulmonary apex measuring 2.6 x 1.4 cm, consistent with primary bronchogenic malignancy. 2. Enlarged right hilar lymph node measuring 1.5 x 1.2 cm, consistent with nodal metastatic disease. 3. Tiny hypodensities in the central liver, incompletely characterized, although statistically likely benign cysts or hemangiomas. Small metastases not strictly excluded. 4. Soft tissue nodule within the right gluteal subcutaneous fat;given location most likely an injection granuloma, however subcutaneous soft tissue metastases  are occasionally seen in metastatic lung malignancy. Attention on follow-up.     09/25/2023 Imaging   MRI brain w wo contrast:  1. Heterogeneously contrast-enhancing mass  of the right cerebellar hemisphere with a large amount of surrounding vasogenic edema. Metastasis and hemangioblastoma are the most common infratentorial tumors in adults. Astrocytoma is another possibility. 2. Mass effect on the fourth ventricle without hydrocephalus.   09/27/2023 Initial Diagnosis   Metastatic lung carcinoma, right (HCC)   10/05/2023 PET scan   IMPRESSION: 1. Hypermetabolic RIGHT upper lobe lung nodule consistent primary bronchogenic carcinoma. 2. Mildly hypermetabolic RIGHT lower paratracheal and RIGHT hilar lymph nodes are concerning for nodal metastasis. No contralateral hypermetabolic lymph nodes. 3. No evidence of distant metastatic disease. 4. Intense activity through the distal anal canal is favored physiologic. Recommend digital rectal exam.   10/11/2023 Procedure   Resection of cerebellar metastasis   10/11/2023 Pathology Results   Diagnosis   A: Brain tumor, biopsy - Metastatic poorly differentiated adenocarcinoma consistent with lung primary.   B: Brain tumor, right, resection - Metastatic poorly differentiated adenocarcinoma consistent with lung primary.   C: Brain tumor, right, specimen trap - Metastatic poorly differentiated adenocarcinoma consistent with lung primary.     10/26/2023 Imaging   MRI brain:  Status post right occipital craniotomy for mass resection. Marked reduction in edema and mass effect, with a now normal appearance of the fourth ventricle. Blood products are present in the region of the approach and resection, with a small amount of adjacent enhancement as would be expected at this time. The study is indeterminate for residual marginal tumor and additional follow-up is warranted.   10/27/2023 Pathology Results   Caris:  PD-L1: Positive, TPS: 100%, 3+ TMB: High, 15 mutation per MB  ALK, EGFR, KRAS, MET, RET, ROS1: Negative   11/03/2023 Procedure   Port placement   11/12/2023 - 11/17/2023 Radiation Therapy   5 fractions of SBRT for R  cerebellar bed   11/27/2023 -  Chemotherapy   Patient is on Treatment Plan : LUNG Carboplatin  + Paclitaxel  + XRT q7d         Current Treatment:  Chemo RT  INTERVAL HISTORY:  Julie Horn is here today for follow up. She experiences intense muscle pain that is transient and does not persist. She also has side effects from recent radiation therapy to her head, including hair loss at the back of her head and headaches last week, which she attributes to the residual effects of the radiation.  She experiences nausea during chemotherapy but has not vomited and manages the nausea with Zofran .  She reports irritation and tenderness at the site of a scar from a port placement on April 11th. The area feels raw and tender to touch, especially when clothing rubs against it, and it has been waking her up at night. No wetness at the site.  I have reviewed the past medical history, past surgical history, social history and family history with the patient and they are unchanged from previous note.  ALLERGIES:  is allergic to levofloxacin, sulfa antibiotics, azithromycin, doxycycline , latex, and wound dressing adhesive.  MEDICATIONS:  Current Outpatient Medications  Medication Sig Dispense Refill   acetaminophen  (TYLENOL ) 500 MG tablet Take 1,000 mg by mouth every 6 (six) hours as needed for mild pain (pain score 1-3). Take 2 tablets (1,000 mg total) by mouth every six (6) hours.     albuterol  (VENTOLIN  HFA) 108 (90 Base) MCG/ACT inhaler Inhale 2 puffs into the lungs every 4 (  four) hours as needed for wheezing or shortness of breath. 6.7 g 2   ascorbic acid (VITAMIN C) 500 MG tablet Take 500 mg by mouth daily. Take 1 tablet (500 mg total) by mouth daily     budesonide -formoterol  (SYMBICORT ) 160-4.5 MCG/ACT inhaler Inhale 2 puffs into the lungs 2 (two) times daily. Rinse mouth well after use. 1 each 11   butalbital-acetaminophen -caffeine (FIORICET) 50-325-40 MG tablet Take 1 tablet by mouth every 4  (four) hours as needed for headache.     CARBOPLATIN  IV Inject into the vein.     co-enzyme Q-10 30 MG capsule Take 30 mg by mouth 3 (three) times daily. Take 1 capsule (30 mg total) by mouth Three (3) times a day.     dexamethasone  (DECADRON ) 4 MG tablet Take 2 tablets daily for 2 days, start the day after chemotherapy. Take with food. 30 tablet 1   docusate sodium (COLACE) 100 MG capsule Take 100 mg by mouth 3 (three) times daily as needed for mild constipation. Take 1 capsule (100 mg total) by mouth Three (3) times a day as needed for constipation.     famotidine  (PEPCID ) 20 MG tablet Take 20 mg by mouth daily.     guaiFENesin  (MUCINEX ) 600 MG 12 hr tablet Take 1,200 mg by mouth 2 (two) times daily. Take 1 tablet (1200 mg total) by mouth every twelve (12) hours.     hydrOXYzine  (VISTARIL ) 25 MG capsule Take 1 capsule (25 mg total) by mouth 3 (three) times daily. For anxiety and sleep. 90 capsule 5   ibuprofen (ADVIL) 100 MG tablet Take 200-400 mg by mouth every 6 (six) hours as needed for pain.     lidocaine -prilocaine  (EMLA ) cream Apply to affected area once 30 g 3   loratadine (CLARITIN) 10 MG tablet Take 10 mg by mouth daily.     magnesium oxide (MAG-OX) 400 (240 Mg) MG tablet Take 400 mg by mouth daily. Take 1 tablet (400 mg total) by mouth daily.     melatonin 1 MG TABS tablet Take 1 mg by mouth at bedtime. Take 5 tablets (5 mg total) by mouth nightly.     montelukast  (SINGULAIR ) 10 MG tablet Take 1 tablet (10 mg total) by mouth at bedtime. (Patient taking differently: Take 10 mg by mouth daily as needed (allergies).) 90 tablet 3   omeprazole (PRILOSEC) 20 MG capsule Take 20 mg by mouth daily.     ondansetron  (ZOFRAN ) 8 MG tablet Take 1 tablet (8 mg total) by mouth every 8 (eight) hours as needed for nausea or vomiting. Start on the third day after chemotherapy. 30 tablet 1   ondansetron  (ZOFRAN -ODT) 4 MG disintegrating tablet Take 1 tablet (4 mg total) by mouth every 8 (eight) hours as  needed for nausea or vomiting. 30 tablet 5   PACLitaxel  (TAXOL  IV) Inject into the vein.     Palonosetron  HCl (ALOXI  IV) Inject into the vein.     prochlorperazine  (COMPAZINE ) 10 MG tablet Take 1 tablet (10 mg total) by mouth every 6 (six) hours as needed for nausea or vomiting. 30 tablet 1   sennosides-docusate sodium (SENOKOT-S) 8.6-50 MG tablet Take 2 tablets by mouth daily.     traMADol  (ULTRAM ) 50 MG tablet Take 50 mg by mouth every 6 (six) hours as needed.     traZODone  (DESYREL ) 150 MG tablet USE FROM 1/3 TO 1 TABLET NIGHTLY AS NEEDED FOR SLEEP. 90 tablet 1   No current facility-administered medications for this visit.  Facility-Administered Medications Ordered in Other Visits  Medication Dose Route Frequency Provider Last Rate Last Admin   0.9 %  sodium chloride  infusion   Intravenous Continuous [name], MD 10 mL/hr at 12/04/23 0957 New Bag at 12/04/23 0957   CARBOplatin  (PARAPLATIN ) 280 mg in sodium chloride  0.9 % 100 mL chemo infusion  280 mg Intravenous Once Vianca Bracher, MD       cetirizine  (QUZYTTIR ) injection 10 mg  10 mg Intravenous Once Derry Kassel, MD       dexamethasone  (DECADRON ) injection 10 mg  10 mg Intravenous Once Bertha Lokken, MD       famotidine  (PEPCID ) IVPB 20 mg premix  20 mg Intravenous Once Dontavis Tschantz, MD       heparin  lock flush 100 unit/mL  500 Units Intracatheter Once PRN Trayce Caravello, MD       PACLitaxel  (TAXOL ) 72 mg in sodium chloride  0.9 % 150 mL chemo infusion (</= 80mg /m2)  45 mg/m2 (Treatment Plan Recorded) Intravenous Once Senaida Chilcote, MD       palonosetron  (ALOXI ) injection 0.25 mg  0.25 mg Intravenous Once [name], MD        REVIEW OF SYSTEMS:   Constitutional: Denies fevers, chills or abnormal weight loss Eyes: Denies blurriness of vision Ears, nose, mouth, throat, and face: Denies mucositis or sore throat Respiratory: Denies cough, dyspnea or wheezes Cardiovascular: Denies palpitation, chest  discomfort or lower extremity swelling Gastrointestinal:  Denies nausea, heartburn or change in bowel habits Skin: Denies abnormal skin rashes Lymphatics: Denies new lymphadenopathy or easy bruising Neurological:Denies numbness, tingling or new weaknesses Behavioral/Psych: Mood is stable, no new changes  All other systems were reviewed with the patient and are negative.   VITALS:  Blood pressure 117/71, last menstrual period 07/26/2019.  Wt Readings from Last 3 Encounters:  12/04/23 142 lb 8 oz (64.6 kg)  11/27/23 139 lb 15.9 oz (63.5 kg)  11/03/23 134 lb (60.8 kg)    There is no height or weight on file to calculate BMI.  Performance status (ECOG): 0 - Asymptomatic  PHYSICAL EXAM:   GENERAL:alert, no distress and comfortable SKIN: Port insertion surgical site tender, erythematous.  No pus. LYMPH:  no palpable lymphadenopathy in the cervical, axillary or inguinal LUNGS: clear to auscultation and percussion with normal breathing effort HEART: regular rate & rhythm and no murmurs and no lower extremity edema ABDOMEN:abdomen soft, non-tender and normal bowel sounds Musculoskeletal:no cyanosis of digits and no clubbing  NEURO: alert & oriented x 3 with fluent speech.  Postsurgical scar on the right occipital region.  LABORATORY DATA:  I have reviewed the data as listed  Lab Results  Component Value Date   WBC 7.3 12/04/2023   NEUTROABS 5.7 12/04/2023   HGB 13.0 12/04/2023   HCT 38.5 12/04/2023   MCV 98.0 12/04/2023   PLT 193 12/04/2023      Chemistry      Component Value Date/Time   NA 136 12/04/2023 0843   NA 145 (H) 05/22/2023 1054   K 3.8 12/04/2023 0843   CL 100 12/04/2023 0843   CO2 26 12/04/2023 0843   BUN 13 12/04/2023 0843   BUN 7 05/22/2023 1054   CREATININE 0.49 12/04/2023 0843   GLU 83 08/25/2015 0000      Component Value Date/Time   CALCIUM 9.3 12/04/2023 0843   ALKPHOS 45 12/04/2023 0843   AST 14 (L) 12/04/2023 0843   ALT 15 12/04/2023 0843    BILITOT 0.4 12/04/2023 0843   BILITOT 0.5  05/22/2023 1054       RADIOGRAPHIC STUDIES: None to review

## 2023-12-04 NOTE — Progress Notes (Signed)
 Nutrition Follow-up:  Pt with lung cancer metastatic to brain. S/p cerebellar mets resection (3/19 at [facility]). She is concurrent chemoRT to lung with weekly paclitaxel /carboplatin . Patient is under the care of Dr. Orvis Blare.   Met with patient in infusion. She is doing well overall. Pt denies radiation induced dysphagia/odynophagia. Appetite has been strong in the last couple weeks secondary to steroid taper. Pt took last dose yesterday. She noticed appetite was not as good. Pt reports intermittent mild nausea. She has not needed antiemetics. Pt is drinking a good amount of water. Some days, pt drinks a gallon of water. She is concerned this may dilute the chemotherapy.    Medications: reviewed   Labs: reviewed   Anthropometrics: Wt 142 lb 8 oz today - increased   5/5 - 139 lb 15.9 oz    NUTRITION DIAGNOSIS: Food and nutrition related knowledge deficit improved    INTERVENTION:  Encourage small frequent meals/snacks high in calories and protein Discussed importance of hydration, suggested limiting fluids before meals given decreased appetite Encourage daily protein shake Provided ensure samples, coupons and shake/smoothie recipes    MONITORING, EVALUATION, GOAL: wt trends, intake    NEXT VISIT: Thursday May 29 via telephone

## 2023-12-04 NOTE — Patient Instructions (Signed)
 [facility] [place] - A DEPT OF [facility]  Discharge Instructions: Thank you for choosing [facility] to provide your oncology and hematology care.  If you have a lab appointment with the Cancer Center - please note that after April 8th, 2024, all labs will be drawn in the cancer center.  You do not have to check in or register with the main entrance as you have in the past but will complete your check-in in the cancer center.  Wear comfortable clothing and clothing appropriate for easy access to any Portacath or PICC line.   We strive to give you quality time with your provider. You may need to reschedule your appointment if you arrive late (15 or more minutes).  Arriving late affects you and other patients whose appointments are after yours.  Also, if you miss three or more appointments without notifying the office, you may be dismissed from the clinic at the provider's discretion.      For prescription refill requests, have your pharmacy contact our office and allow 72 hours for refills to be completed.    Today you received the following chemotherapy and/or immunotherapy agents Taxol/Carbo      To help prevent nausea and vomiting after your treatment, we encourage you to take your nausea medication as directed.  BELOW ARE SYMPTOMS THAT SHOULD BE REPORTED IMMEDIATELY: *FEVER GREATER THAN 100.4 F (38 C) OR HIGHER *CHILLS OR SWEATING *NAUSEA AND VOMITING THAT IS NOT CONTROLLED WITH YOUR NAUSEA MEDICATION *UNUSUAL SHORTNESS OF BREATH *UNUSUAL BRUISING OR BLEEDING *URINARY PROBLEMS (pain or burning when urinating, or frequent urination) *BOWEL PROBLEMS (unusual diarrhea, constipation, pain near the anus) TENDERNESS IN MOUTH AND THROAT WITH OR WITHOUT PRESENCE OF ULCERS (sore throat, sores in mouth, or a toothache) UNUSUAL RASH, SWELLING OR PAIN  UNUSUAL VAGINAL DISCHARGE OR ITCHING   Items with * indicate a potential emergency and should be followed  up as soon as possible or go to the Emergency Department if any problems should occur.  Please show the CHEMOTHERAPY ALERT CARD or IMMUNOTHERAPY ALERT CARD at check-in to the Emergency Department and triage nurse.  Should you have questions after your visit or need to cancel or reschedule your appointment, please contact [facility] [place] - A DEPT OF [facility] [phone]  and follow the prompts.  Office hours are 8:00 a.m. to 4:30 p.m. Monday - Friday. Please note that voicemails left after 4:00 p.m. may not be returned until the following business day.  We are closed weekends and major holidays. You have access to a nurse at all times for urgent questions. Please call the main number to the clinic [phone] and follow the prompts.  For any non-urgent questions, you may also contact your provider using MyChart. We now offer e-Visits for anyone 53 and older to request care online for non-urgent symptoms. For details visit [email or web address].   Also download the MyChart app! Go to the app store, search "MyChart", open the app, select [facility], and log in with your MyChart username and password.

## 2023-12-04 NOTE — Patient Instructions (Signed)
 VISIT SUMMARY:  Today, we discussed your follow-up after radiation therapy for brain metastases. We reviewed your symptoms, including muscle pain, side effects from radiation, nausea during chemotherapy, and irritation at the site of your port placement scar. We also discussed your current medications and work-life balance.  YOUR PLAN:  -BRAIN METASTASES: Brain metastases are cancer cells that have spread to the brain from another part of the body. You are experiencing headaches and hair loss from previous radiation therapy. An MRI is scheduled for the week before July 19th as part of your study protocol. It is important to monitor for any new neurological symptoms, and frequent MRI monitoring every 3-6 months will be necessary.   -NAUSEA DUE TO CHEMOTHERAPY: Chemotherapy-induced nausea is a common side effect of cancer treatment. You are managing your nausea well with Zofran  and have not experienced vomiting. Continue taking Zofran  as needed and monitor for any increase in nausea or vomiting.  -SCAR IRRITATION AND PAIN: The irritation and tenderness at the site of your port placement scar are likely due to friction from clothing. There is no sign of infection. We recommend applying silicone patches to the scar and using Tylenol  for pain management as needed. If symptoms persist or worsen, please come back for a re-evaluation next week.  INSTRUCTIONS:  Please coordinate your MRI for the week before July 19th. Continue monitoring for any new neurological symptoms and increased nausea or vomiting. If the irritation and tenderness at your port placement scar persist or worsen, schedule a follow-up appointment next week.

## 2023-12-04 NOTE — Progress Notes (Signed)
 Patient tolerated chemotherapy with no complaints voiced.  Side effects with management reviewed with understanding verbalized.  Port site clean and dry with no bruising or swelling noted at site.  Good blood return noted before and after administration of chemotherapy.  Patient left in satisfactory condition with VSS and no s/s of distress noted. All follow ups as scheduled.   Morio Widen [employer]

## 2023-12-04 NOTE — Assessment & Plan Note (Signed)
 Patient quit smoking since a diagnosis of lung cancer  - Encouraged to abstain from smoking

## 2023-12-04 NOTE — Assessment & Plan Note (Signed)
 Scar from port placement is irritated and tender.  No signs of infection.   - Apply silicone patches to the scar. - Use Tylenol  for pain management as needed. - Re-evaluate next week if symptoms persist or worsen.

## 2023-12-04 NOTE — Progress Notes (Signed)
 Per patient she is in menopause.  Ok to [employer] urine pregnancy test from chemotherapy plan.  V.O. Dr Roberta Chin, PharmD

## 2023-12-04 NOTE — Assessment & Plan Note (Signed)
 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   S/p cerebellar lesion resection consistent with lung primary.   Oncology history as below. PET scan showed limited disease to lung and lymph nodes.  Discussed at tumor board, surgery is not a feasible option secondary to lymph node involvement.  Recommended chemoradiation. Caris: PD-L1: 100%   -Started thoracic radiation today.  C2D1 today.  Tolerated the first cycle well with some nausea.  Labs and physical exam stable today and will proceed with chemotherapy.  Reemphasized common side effects including immunosuppression, nausea, vomiting. -For nausea please take Compazine  as prescribed.  Zofran  can be taken after day 3.  Will hold off on steroids at this time -Discussed that we will obtain a CT scan to assess response 4 weeks after completing chemo RT and if stable or response is better we will do immunotherapy for 1 year with durvalumab -Will coordinate care and imaging with Dr. Sulema Endo as, radiation oncologist.  Return to clinic in 1 week before third cycle of chemotherapy

## 2023-12-05 ENCOUNTER — Inpatient Hospital Stay: Admitting: Oncology

## 2023-12-05 ENCOUNTER — Inpatient Hospital Stay

## 2023-12-05 DIAGNOSIS — J45909 Unspecified asthma, uncomplicated: Secondary | ICD-10-CM | POA: Diagnosis not present

## 2023-12-05 DIAGNOSIS — G936 Cerebral edema: Secondary | ICD-10-CM | POA: Diagnosis not present

## 2023-12-05 DIAGNOSIS — F4310 Post-traumatic stress disorder, unspecified: Secondary | ICD-10-CM | POA: Diagnosis not present

## 2023-12-05 DIAGNOSIS — Z825 Family history of asthma and other chronic lower respiratory diseases: Secondary | ICD-10-CM | POA: Diagnosis not present

## 2023-12-05 DIAGNOSIS — Z3202 Encounter for pregnancy test, result negative: Secondary | ICD-10-CM | POA: Diagnosis not present

## 2023-12-05 DIAGNOSIS — K219 Gastro-esophageal reflux disease without esophagitis: Secondary | ICD-10-CM | POA: Diagnosis not present

## 2023-12-05 DIAGNOSIS — C3411 Malignant neoplasm of upper lobe, right bronchus or lung: Secondary | ICD-10-CM | POA: Diagnosis not present

## 2023-12-05 DIAGNOSIS — R220 Localized swelling, mass and lump, head: Secondary | ICD-10-CM | POA: Diagnosis not present

## 2023-12-05 DIAGNOSIS — J439 Emphysema, unspecified: Secondary | ICD-10-CM | POA: Diagnosis not present

## 2023-12-05 DIAGNOSIS — I7300 Raynaud's syndrome without gangrene: Secondary | ICD-10-CM | POA: Diagnosis not present

## 2023-12-05 DIAGNOSIS — Z87891 Personal history of nicotine dependence: Secondary | ICD-10-CM | POA: Diagnosis not present

## 2023-12-05 DIAGNOSIS — F32A Depression, unspecified: Secondary | ICD-10-CM | POA: Diagnosis not present

## 2023-12-05 DIAGNOSIS — F419 Anxiety disorder, unspecified: Secondary | ICD-10-CM | POA: Diagnosis not present

## 2023-12-06 DIAGNOSIS — J45909 Unspecified asthma, uncomplicated: Secondary | ICD-10-CM | POA: Diagnosis not present

## 2023-12-06 DIAGNOSIS — Z3202 Encounter for pregnancy test, result negative: Secondary | ICD-10-CM | POA: Diagnosis not present

## 2023-12-06 DIAGNOSIS — R220 Localized swelling, mass and lump, head: Secondary | ICD-10-CM | POA: Diagnosis not present

## 2023-12-06 DIAGNOSIS — F419 Anxiety disorder, unspecified: Secondary | ICD-10-CM | POA: Diagnosis not present

## 2023-12-06 DIAGNOSIS — Z87891 Personal history of nicotine dependence: Secondary | ICD-10-CM | POA: Diagnosis not present

## 2023-12-06 DIAGNOSIS — J439 Emphysema, unspecified: Secondary | ICD-10-CM | POA: Diagnosis not present

## 2023-12-06 DIAGNOSIS — F32A Depression, unspecified: Secondary | ICD-10-CM | POA: Diagnosis not present

## 2023-12-06 DIAGNOSIS — K219 Gastro-esophageal reflux disease without esophagitis: Secondary | ICD-10-CM | POA: Diagnosis not present

## 2023-12-06 DIAGNOSIS — Z825 Family history of asthma and other chronic lower respiratory diseases: Secondary | ICD-10-CM | POA: Diagnosis not present

## 2023-12-06 DIAGNOSIS — C3411 Malignant neoplasm of upper lobe, right bronchus or lung: Secondary | ICD-10-CM | POA: Diagnosis not present

## 2023-12-06 DIAGNOSIS — G936 Cerebral edema: Secondary | ICD-10-CM | POA: Diagnosis not present

## 2023-12-06 DIAGNOSIS — F4310 Post-traumatic stress disorder, unspecified: Secondary | ICD-10-CM | POA: Diagnosis not present

## 2023-12-06 DIAGNOSIS — I7300 Raynaud's syndrome without gangrene: Secondary | ICD-10-CM | POA: Diagnosis not present

## 2023-12-07 DIAGNOSIS — F32A Depression, unspecified: Secondary | ICD-10-CM | POA: Diagnosis not present

## 2023-12-07 DIAGNOSIS — F4310 Post-traumatic stress disorder, unspecified: Secondary | ICD-10-CM | POA: Diagnosis not present

## 2023-12-07 DIAGNOSIS — I7300 Raynaud's syndrome without gangrene: Secondary | ICD-10-CM | POA: Diagnosis not present

## 2023-12-07 DIAGNOSIS — Z87891 Personal history of nicotine dependence: Secondary | ICD-10-CM | POA: Diagnosis not present

## 2023-12-07 DIAGNOSIS — K219 Gastro-esophageal reflux disease without esophagitis: Secondary | ICD-10-CM | POA: Diagnosis not present

## 2023-12-07 DIAGNOSIS — J439 Emphysema, unspecified: Secondary | ICD-10-CM | POA: Diagnosis not present

## 2023-12-07 DIAGNOSIS — G936 Cerebral edema: Secondary | ICD-10-CM | POA: Diagnosis not present

## 2023-12-07 DIAGNOSIS — Z825 Family history of asthma and other chronic lower respiratory diseases: Secondary | ICD-10-CM | POA: Diagnosis not present

## 2023-12-07 DIAGNOSIS — Z3202 Encounter for pregnancy test, result negative: Secondary | ICD-10-CM | POA: Diagnosis not present

## 2023-12-07 DIAGNOSIS — F419 Anxiety disorder, unspecified: Secondary | ICD-10-CM | POA: Diagnosis not present

## 2023-12-07 DIAGNOSIS — C3411 Malignant neoplasm of upper lobe, right bronchus or lung: Secondary | ICD-10-CM | POA: Diagnosis not present

## 2023-12-07 DIAGNOSIS — J45909 Unspecified asthma, uncomplicated: Secondary | ICD-10-CM | POA: Diagnosis not present

## 2023-12-07 DIAGNOSIS — R220 Localized swelling, mass and lump, head: Secondary | ICD-10-CM | POA: Diagnosis not present

## 2023-12-08 DIAGNOSIS — F32A Depression, unspecified: Secondary | ICD-10-CM | POA: Diagnosis not present

## 2023-12-08 DIAGNOSIS — K219 Gastro-esophageal reflux disease without esophagitis: Secondary | ICD-10-CM | POA: Diagnosis not present

## 2023-12-08 DIAGNOSIS — R220 Localized swelling, mass and lump, head: Secondary | ICD-10-CM | POA: Diagnosis not present

## 2023-12-08 DIAGNOSIS — F4310 Post-traumatic stress disorder, unspecified: Secondary | ICD-10-CM | POA: Diagnosis not present

## 2023-12-08 DIAGNOSIS — Z825 Family history of asthma and other chronic lower respiratory diseases: Secondary | ICD-10-CM | POA: Diagnosis not present

## 2023-12-08 DIAGNOSIS — Z87891 Personal history of nicotine dependence: Secondary | ICD-10-CM | POA: Diagnosis not present

## 2023-12-08 DIAGNOSIS — I7300 Raynaud's syndrome without gangrene: Secondary | ICD-10-CM | POA: Diagnosis not present

## 2023-12-08 DIAGNOSIS — Z3202 Encounter for pregnancy test, result negative: Secondary | ICD-10-CM | POA: Diagnosis not present

## 2023-12-08 DIAGNOSIS — C3411 Malignant neoplasm of upper lobe, right bronchus or lung: Secondary | ICD-10-CM | POA: Diagnosis not present

## 2023-12-08 DIAGNOSIS — G936 Cerebral edema: Secondary | ICD-10-CM | POA: Diagnosis not present

## 2023-12-08 DIAGNOSIS — J45909 Unspecified asthma, uncomplicated: Secondary | ICD-10-CM | POA: Diagnosis not present

## 2023-12-08 DIAGNOSIS — J439 Emphysema, unspecified: Secondary | ICD-10-CM | POA: Diagnosis not present

## 2023-12-08 DIAGNOSIS — F419 Anxiety disorder, unspecified: Secondary | ICD-10-CM | POA: Diagnosis not present

## 2023-12-11 ENCOUNTER — Inpatient Hospital Stay

## 2023-12-11 ENCOUNTER — Inpatient Hospital Stay: Admitting: Oncology

## 2023-12-11 DIAGNOSIS — F32A Depression, unspecified: Secondary | ICD-10-CM | POA: Diagnosis not present

## 2023-12-11 DIAGNOSIS — F17200 Nicotine dependence, unspecified, uncomplicated: Secondary | ICD-10-CM | POA: Diagnosis not present

## 2023-12-11 DIAGNOSIS — Z5111 Encounter for antineoplastic chemotherapy: Secondary | ICD-10-CM | POA: Diagnosis not present

## 2023-12-11 DIAGNOSIS — Z3202 Encounter for pregnancy test, result negative: Secondary | ICD-10-CM | POA: Diagnosis not present

## 2023-12-11 DIAGNOSIS — F4310 Post-traumatic stress disorder, unspecified: Secondary | ICD-10-CM | POA: Diagnosis not present

## 2023-12-11 DIAGNOSIS — F419 Anxiety disorder, unspecified: Secondary | ICD-10-CM | POA: Diagnosis not present

## 2023-12-11 DIAGNOSIS — C3411 Malignant neoplasm of upper lobe, right bronchus or lung: Secondary | ICD-10-CM | POA: Diagnosis not present

## 2023-12-11 DIAGNOSIS — J45909 Unspecified asthma, uncomplicated: Secondary | ICD-10-CM | POA: Diagnosis not present

## 2023-12-11 DIAGNOSIS — Z825 Family history of asthma and other chronic lower respiratory diseases: Secondary | ICD-10-CM | POA: Diagnosis not present

## 2023-12-11 DIAGNOSIS — R220 Localized swelling, mass and lump, head: Secondary | ICD-10-CM | POA: Diagnosis not present

## 2023-12-11 DIAGNOSIS — Z87891 Personal history of nicotine dependence: Secondary | ICD-10-CM | POA: Diagnosis not present

## 2023-12-11 DIAGNOSIS — G936 Cerebral edema: Secondary | ICD-10-CM | POA: Diagnosis not present

## 2023-12-11 DIAGNOSIS — J439 Emphysema, unspecified: Secondary | ICD-10-CM | POA: Diagnosis not present

## 2023-12-11 DIAGNOSIS — C7801 Secondary malignant neoplasm of right lung: Secondary | ICD-10-CM | POA: Diagnosis not present

## 2023-12-11 DIAGNOSIS — C779 Secondary and unspecified malignant neoplasm of lymph node, unspecified: Secondary | ICD-10-CM | POA: Diagnosis not present

## 2023-12-11 DIAGNOSIS — C7931 Secondary malignant neoplasm of brain: Secondary | ICD-10-CM | POA: Diagnosis not present

## 2023-12-11 DIAGNOSIS — L905 Scar conditions and fibrosis of skin: Secondary | ICD-10-CM | POA: Diagnosis not present

## 2023-12-11 DIAGNOSIS — Z79899 Other long term (current) drug therapy: Secondary | ICD-10-CM | POA: Diagnosis not present

## 2023-12-11 DIAGNOSIS — K219 Gastro-esophageal reflux disease without esophagitis: Secondary | ICD-10-CM | POA: Diagnosis not present

## 2023-12-11 DIAGNOSIS — I7300 Raynaud's syndrome without gangrene: Secondary | ICD-10-CM | POA: Diagnosis not present

## 2023-12-12 ENCOUNTER — Inpatient Hospital Stay: Admitting: Oncology

## 2023-12-12 ENCOUNTER — Inpatient Hospital Stay (HOSPITAL_BASED_OUTPATIENT_CLINIC_OR_DEPARTMENT_OTHER): Admitting: Oncology

## 2023-12-12 ENCOUNTER — Inpatient Hospital Stay

## 2023-12-12 VITALS — BP 119/81 | HR 84 | Temp 97.0°F | Resp 18

## 2023-12-12 VITALS — Wt 140.2 lb

## 2023-12-12 VITALS — BP 103/71 | HR 88 | Temp 97.0°F | Resp 18

## 2023-12-12 DIAGNOSIS — C3411 Malignant neoplasm of upper lobe, right bronchus or lung: Secondary | ICD-10-CM | POA: Diagnosis not present

## 2023-12-12 DIAGNOSIS — Z87891 Personal history of nicotine dependence: Secondary | ICD-10-CM | POA: Diagnosis not present

## 2023-12-12 DIAGNOSIS — Z825 Family history of asthma and other chronic lower respiratory diseases: Secondary | ICD-10-CM | POA: Diagnosis not present

## 2023-12-12 DIAGNOSIS — I7300 Raynaud's syndrome without gangrene: Secondary | ICD-10-CM | POA: Diagnosis not present

## 2023-12-12 DIAGNOSIS — Z3202 Encounter for pregnancy test, result negative: Secondary | ICD-10-CM | POA: Diagnosis not present

## 2023-12-12 DIAGNOSIS — Z95828 Presence of other vascular implants and grafts: Secondary | ICD-10-CM

## 2023-12-12 DIAGNOSIS — D72810 Lymphocytopenia: Secondary | ICD-10-CM

## 2023-12-12 DIAGNOSIS — F419 Anxiety disorder, unspecified: Secondary | ICD-10-CM | POA: Diagnosis not present

## 2023-12-12 DIAGNOSIS — D72819 Decreased white blood cell count, unspecified: Secondary | ICD-10-CM | POA: Insufficient documentation

## 2023-12-12 DIAGNOSIS — F32A Depression, unspecified: Secondary | ICD-10-CM | POA: Diagnosis not present

## 2023-12-12 DIAGNOSIS — C7931 Secondary malignant neoplasm of brain: Secondary | ICD-10-CM

## 2023-12-12 DIAGNOSIS — C779 Secondary and unspecified malignant neoplasm of lymph node, unspecified: Secondary | ICD-10-CM | POA: Diagnosis not present

## 2023-12-12 DIAGNOSIS — C7801 Secondary malignant neoplasm of right lung: Secondary | ICD-10-CM | POA: Diagnosis not present

## 2023-12-12 DIAGNOSIS — Z79899 Other long term (current) drug therapy: Secondary | ICD-10-CM | POA: Diagnosis not present

## 2023-12-12 DIAGNOSIS — F4310 Post-traumatic stress disorder, unspecified: Secondary | ICD-10-CM | POA: Diagnosis not present

## 2023-12-12 DIAGNOSIS — L905 Scar conditions and fibrosis of skin: Secondary | ICD-10-CM

## 2023-12-12 DIAGNOSIS — R110 Nausea: Secondary | ICD-10-CM | POA: Insufficient documentation

## 2023-12-12 DIAGNOSIS — G936 Cerebral edema: Secondary | ICD-10-CM | POA: Diagnosis not present

## 2023-12-12 DIAGNOSIS — K219 Gastro-esophageal reflux disease without esophagitis: Secondary | ICD-10-CM | POA: Diagnosis not present

## 2023-12-12 DIAGNOSIS — Z5111 Encounter for antineoplastic chemotherapy: Secondary | ICD-10-CM | POA: Diagnosis not present

## 2023-12-12 DIAGNOSIS — F17200 Nicotine dependence, unspecified, uncomplicated: Secondary | ICD-10-CM | POA: Diagnosis not present

## 2023-12-12 DIAGNOSIS — J439 Emphysema, unspecified: Secondary | ICD-10-CM | POA: Diagnosis not present

## 2023-12-12 DIAGNOSIS — R220 Localized swelling, mass and lump, head: Secondary | ICD-10-CM | POA: Diagnosis not present

## 2023-12-12 DIAGNOSIS — J45909 Unspecified asthma, uncomplicated: Secondary | ICD-10-CM | POA: Diagnosis not present

## 2023-12-12 LAB — COMPREHENSIVE METABOLIC PANEL WITH GFR
ALT: 16 U/L (ref 0–44)
AST: 12 U/L — ABNORMAL LOW (ref 15–41)
Albumin: 3.7 g/dL (ref 3.5–5.0)
Alkaline Phosphatase: 52 U/L (ref 38–126)
Anion gap: 9 (ref 5–15)
BUN: 10 mg/dL (ref 6–20)
CO2: 24 mmol/L (ref 22–32)
Calcium: 9.2 mg/dL (ref 8.9–10.3)
Chloride: 100 mmol/L (ref 98–111)
Creatinine, Ser: 0.47 mg/dL (ref 0.44–1.00)
GFR, Estimated: >60 mL/min (ref >60)
Glucose, Bld: 91 mg/dL (ref 70–99)
Potassium: 3.9 mmol/L (ref 3.5–5.1)
Sodium: 133 mmol/L — ABNORMAL LOW (ref 135–145)
Total Bilirubin: 0.6 mg/dL (ref 0.0–1.2)
Total Protein: 6.7 g/dL (ref 6.5–8.1)

## 2023-12-12 LAB — CBC WITH DIFFERENTIAL/PLATELET
Abs Immature Granulocytes: 0.01 10*3/uL (ref 0.00–0.07)
Basophils Absolute: 0 10*3/uL (ref 0.0–0.1)
Basophils Relative: 1 %
Eosinophils Absolute: 0.1 10*3/uL (ref 0.0–0.5)
Eosinophils Relative: 3 %
HCT: 37.4 % (ref 36.0–46.0)
Hemoglobin: 13 g/dL (ref 12.0–15.0)
Immature Granulocytes: 0 %
Lymphocytes Relative: 15 %
Lymphs Abs: 0.5 10*3/uL — ABNORMAL LOW (ref 0.7–4.0)
MCH: 33.5 pg (ref 26.0–34.0)
MCHC: 34.8 g/dL (ref 30.0–36.0)
MCV: 96.4 fL (ref 80.0–100.0)
Monocytes Absolute: 0.4 10*3/uL (ref 0.1–1.0)
Monocytes Relative: 13 %
Neutro Abs: 2.4 10*3/uL (ref 1.7–7.7)
Neutrophils Relative %: 68 %
Platelets: 197 10*3/uL (ref 150–400)
RBC: 3.88 MIL/uL (ref 3.87–5.11)
RDW: 12.9 % (ref 11.5–15.5)
WBC: 3.5 10*3/uL — ABNORMAL LOW (ref 4.0–10.5)
nRBC: 0 % (ref 0.0–0.2)

## 2023-12-12 LAB — MAGNESIUM: Magnesium: 2 mg/dL (ref 1.7–2.4)

## 2023-12-12 MED ORDER — BUTALBITAL-APAP-CAFFEINE 50-325-40 MG PO TABS: 1.0000 | ORAL_TABLET | ORAL | 1 refills | Status: AC | PRN

## 2023-12-12 MED ORDER — HEPARIN SOD (PORK) LOCK FLUSH 100 UNIT/ML IV SOLN
500.0000 [IU] | Freq: Once | INTRAVENOUS | Status: AC | PRN
  Administered 2023-12-12: 500 [IU]

## 2023-12-12 MED ORDER — PALONOSETRON HCL INJECTION 0.25 MG/5ML
0.2500 mg | Freq: Once | INTRAVENOUS | Status: AC
  Administered 2023-12-12: 0.25 mg via INTRAVENOUS
  Filled 2023-12-12: qty 5

## 2023-12-12 MED ORDER — SODIUM CHLORIDE 0.9 % IV SOLN
45.0000 mg/m2 | Freq: Once | INTRAVENOUS | Status: AC
  Administered 2023-12-12: 72 mg via INTRAVENOUS
  Filled 2023-12-12: qty 12

## 2023-12-12 MED ORDER — CETIRIZINE HCL 10 MG/ML IV SOLN
10.0000 mg | Freq: Once | INTRAVENOUS | Status: AC
  Administered 2023-12-12: 10 mg via INTRAVENOUS
  Filled 2023-12-12: qty 1

## 2023-12-12 MED ORDER — SODIUM CHLORIDE 0.9 % IV SOLN: INTRAVENOUS | Status: DC

## 2023-12-12 MED ORDER — SODIUM CHLORIDE 0.9 % IV SOLN
275.0000 mg | Freq: Once | INTRAVENOUS | Status: AC
  Administered 2023-12-12: 280 mg via INTRAVENOUS
  Filled 2023-12-12: qty 28

## 2023-12-12 MED ORDER — DEXAMETHASONE SODIUM PHOSPHATE 10 MG/ML IJ SOLN
10.0000 mg | Freq: Once | INTRAMUSCULAR | Status: AC
  Administered 2023-12-12: 10 mg via INTRAVENOUS
  Filled 2023-12-12: qty 1

## 2023-12-12 MED ORDER — SODIUM CHLORIDE 0.9% FLUSH
10.0000 mL | INTRAVENOUS | Status: DC | PRN
  Administered 2023-12-12: 10 mL

## 2023-12-12 MED ORDER — FAMOTIDINE IN NACL 20-0.9 MG/50ML-% IV SOLN
20.0000 mg | Freq: Once | INTRAVENOUS | Status: AC
  Administered 2023-12-12: 20 mg via INTRAVENOUS
  Filled 2023-12-12: qty 50

## 2023-12-12 MED ORDER — SODIUM CHLORIDE FLUSH 0.9 % IV SOLN
10.0000 mL | Freq: Once | INTRAVENOUS | Status: AC
  Administered 2023-12-12: 10 mL via INTRAVENOUS
  Filled 2023-12-12: qty 10

## 2023-12-12 NOTE — Assessment & Plan Note (Signed)
 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   S/p cerebellar lesion resection consistent with lung primary.   Oncology history as below. PET scan showed limited disease to lung and lymph nodes.  Discussed at tumor board, surgery is not a feasible option secondary to lymph node involvement.  Recommended chemoradiation. Caris: PD-L1: 100%   -Started thoracic radiation. C3D1 today.  Reports some nausea manageable with Compazine .  Labs and physical exam stable today and will proceed with chemotherapy.  Reemphasized common side effects including immunosuppression, nausea, vomiting. -For nausea please take Compazine  as prescribed.  Zofran  can be taken after day 3.  Will hold off on steroids at this time -Discussed that we will obtain a CT scan to assess response 4 weeks after completing chemo RT and if stable or response is better we will do immunotherapy for 1 year with durvalumab -Will coordinate care and imaging with Dr. Cipriano Creeks, radiation oncologist.  Return to clinic in 1 week before fourth cycle of chemotherapy

## 2023-12-12 NOTE — Patient Instructions (Signed)
 VISIT SUMMARY:  Today, we discussed your ongoing chemotherapy and radiation treatments, which have been causing nausea and headaches. We also reviewed your current medications and their effectiveness, as well as your recent symptoms and overall health.  YOUR PLAN:  -CHEMOTHERAPY AND RADIATION THERAPY: You are currently undergoing chemotherapy and radiation therapy, which can lower your white blood cell count, making you more susceptible to infections. Your neutrophil count is normal, so there is no increased risk of infection at this time. We will continue to monitor your white blood cell count, and you should wear a mask around sick individuals to prevent infection.  -CHEMOTHERAPY-INDUCED NAUSEA: Chemotherapy can cause nausea, but you have been managing it well with Compazine  when taken as scheduled. Please continue taking Compazine  as directed to keep your nausea under control.  -HEADACHES: You have been experiencing persistent headaches that affect your sleep and daily activities. Fioricet has been helping to relieve these headaches. We recommend that you continue taking Fioricet as prescribed and contact Dr. Chyrl Crawford for further evaluation of your headaches.  INSTRUCTIONS:  Please follow up with Dr. Chyrl Crawford for further evaluation of your headaches. Continue to monitor your white blood cell count and wear a mask around sick individuals to prevent infection. Keep taking Compazine  as scheduled to manage your nausea.

## 2023-12-12 NOTE — Assessment & Plan Note (Signed)
 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 Patient completed gamma knife at [facility].  Currently on a clinical trial assessing gamma knife versus gamma tile Completed steroid taper.  - Continue to follow with [facility]. - MRI protocol per the study -Patient reports headaches that are only manageable with Fioricet.  Recommended to follow-up regarding headaches with [facility] team.

## 2023-12-12 NOTE — Progress Notes (Signed)
 Patient Care Team: Roise Cleaver, MD as PCP - General (Family Medicine) [name], MD as Consulting Physician (Rheumatology) [name], MD as Medical Oncologist (Medical Oncology) Gerhard Knuckles, RN as Oncology Nurse Navigator (Medical Oncology) Marc Senior, MD as Consulting Physician (Pulmonary Disease)  Clinic Day:  12/12/2023  Referring physician: Roise Cleaver, MD   CHIEF COMPLAINT:  CC: Metastatic lung adenocarcinoma   ASSESSMENT & PLAN:   Assessment & Plan: Julie Horn  is a 48 y.o. 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   S/p cerebellar lesion resection consistent with lung primary.   Oncology history as below. PET scan showed limited disease to lung and lymph nodes.  Discussed at tumor board, surgery is not a feasible option secondary to lymph node involvement.  Recommended chemoradiation. Caris: PD-L1: 100%   -Started thoracic radiation. C3D1 today.  Reports some nausea manageable with Compazine .  Labs and physical exam stable today and will proceed with chemotherapy.  Reemphasized common side effects including immunosuppression, nausea, vomiting. -For nausea please take Compazine  as prescribed.  Zofran  can be taken after day 3.  Will hold off on steroids at this time -Discussed that we will obtain a CT scan to assess response 4 weeks after completing chemo RT and if stable or response is better we will do immunotherapy for 1 year with durvalumab -Will coordinate care and imaging with Dr. Cipriano Creeks, radiation oncologist.  Return to clinic in 1 week before fourth cycle of chemotherapy   Metastasis to brain [facility]) Cerebellar lesion s/p resection consistent with the poorly differentiated adenocarcinoma of lung primary. Patient completed gamma knife at [facility].  Currently on a clinical trial assessing  gamma knife versus gamma tile Completed steroid taper.  - Continue to follow with [facility]. - MRI protocol per the study -Patient reports headaches that are only manageable with Fioricet.  Recommended to follow-up regarding headaches with [facility] team.  Scar pain Scar from port placement is irritated and tender.  No signs of infection.  Improved with silicone patches  - Continue using silicone patches to the scar. - Use Tylenol  for pain management as needed.  Nicotine dependence with current use Patient quit smoking since a diagnosis of lung cancer  - Encouraged to abstain from smoking  Nausea without vomiting Patient has chemotherapy-induced nausea.  Now better controlled with Compazine   - Continue prescribed antinausea medications  Leukopenia Patient has leukopenia without neutropenia at this time. Likely chemotherapy and radiation therapy-induced  - Continue to monitor for now   The patient understands the plans discussed today and is in agreement with them.  She knows to contact our office if she develops concerns prior to her next appointment.  I provided 30 minutes of face-to-face time during this encounter and > 50% was spent counseling as documented under my assessment and plan.    [name], MD  [facility] [place] - A DEPT OF [facility] [address] [place] [address] Dept: [phone] Dept Fax: [phone]      ONCOLOGY HISTORY:   Oncology History  Metastatic lung carcinoma, right (HCC)  09/25/2023 Imaging   CT CAP w contrast:  1. Spiculated mass in the posterior right pulmonary apex measuring 2.6 x 1.4 cm, consistent with primary bronchogenic malignancy. 2. Enlarged right hilar lymph node measuring 1.5 x 1.2 cm, consistent with nodal metastatic disease.  3. Tiny hypodensities in the central liver, incompletely characterized, although statistically likely benign cysts or hemangiomas. Small  metastases not strictly excluded. 4. Soft tissue nodule within the right gluteal subcutaneous fat;given location most likely an injection granuloma, however subcutaneous soft tissue metastases are occasionally seen in metastatic lung malignancy. Attention on follow-up.     09/25/2023 Imaging   MRI brain w wo contrast:  1. Heterogeneously contrast-enhancing mass of the right cerebellar hemisphere with a large amount of surrounding vasogenic edema. Metastasis and hemangioblastoma are the most common infratentorial tumors in adults. Astrocytoma is another possibility. 2. Mass effect on the fourth ventricle without hydrocephalus.   09/27/2023 Initial Diagnosis   Metastatic lung carcinoma, right (HCC)   10/05/2023 PET scan   IMPRESSION: 1. Hypermetabolic RIGHT upper lobe lung nodule consistent primary bronchogenic carcinoma. 2. Mildly hypermetabolic RIGHT lower paratracheal and RIGHT hilar lymph nodes are concerning for nodal metastasis. No contralateral hypermetabolic lymph nodes. 3. No evidence of distant metastatic disease. 4. Intense activity through the distal anal canal is favored physiologic. Recommend digital rectal exam.   10/11/2023 Procedure   Resection of cerebellar metastasis   10/11/2023 Pathology Results   Diagnosis   A: Brain tumor, biopsy - Metastatic poorly differentiated adenocarcinoma consistent with lung primary.   B: Brain tumor, right, resection - Metastatic poorly differentiated adenocarcinoma consistent with lung primary.   C: Brain tumor, right, specimen trap - Metastatic poorly differentiated adenocarcinoma consistent with lung primary.     10/26/2023 Imaging   MRI brain:  Status post right occipital craniotomy for mass resection. Marked reduction in edema and mass effect, with a now normal appearance of the fourth ventricle. Blood products are present in the region of the approach and resection, with a small amount of adjacent enhancement as would be expected at  this time. The study is indeterminate for residual marginal tumor and additional follow-up is warranted.   10/27/2023 Pathology Results   Caris:  PD-L1: Positive, TPS: 100%, 3+ TMB: High, 15 mutation per MB  ALK, EGFR, KRAS, MET, RET, ROS1: Negative   11/03/2023 Procedure   Port placement   11/12/2023 - 11/17/2023 Radiation Therapy   5 fractions of SBRT for R cerebellar bed   11/27/2023 -  Chemotherapy   Patient is on Treatment Plan : LUNG Carboplatin  + Paclitaxel  + XRT q7d         Current Treatment:  Chemo RT  INTERVAL HISTORY:  Julie Horn is here today for follow up.  She reported some nausea after chemotherapy.  She has been taking Compazine  as scheduled to prevent this.  This has helped her significantly.  She also experience persistent headaches that wake her up at night and persistent throughout the day and Fioricet is the only thing that helps.  She has been using Scotch tape on her "scar and this has been helpful with previous irritation that she mentioned before.  She feels tired, which she attributes to sitting for long periods. No other new symptoms are reported apart from those discussed.  Overall, patient is feeling well and is able to continue to work.  I have reviewed the past medical history, past surgical history, social history and family history with the patient and they are unchanged from previous note.  ALLERGIES:  is allergic to levofloxacin, sulfa antibiotics, azithromycin, doxycycline , latex, and wound dressing adhesive.  MEDICATIONS:  Current Outpatient Medications  Medication Sig Dispense Refill   acetaminophen  (TYLENOL ) 500 MG tablet Take 1,000 mg by mouth every 6 (six) hours as needed  for mild pain (pain score 1-3). Take 2 tablets (1,000 mg total) by mouth every six (6) hours.     albuterol  (VENTOLIN  HFA) 108 (90 Base) MCG/ACT inhaler Inhale 2 puffs into the lungs every 4 (four) hours as needed for wheezing or shortness of breath. 6.7 g 2   ascorbic  acid (VITAMIN C) 500 MG tablet Take 500 mg by mouth daily. Take 1 tablet (500 mg total) by mouth daily     budesonide -formoterol  (SYMBICORT ) 160-4.5 MCG/ACT inhaler Inhale 2 puffs into the lungs 2 (two) times daily. Rinse mouth well after use. 1 each 11   CARBOPLATIN  IV Inject into the vein.     co-enzyme Q-10 30 MG capsule Take 30 mg by mouth 3 (three) times daily. Take 1 capsule (30 mg total) by mouth Three (3) times a day.     dexamethasone  (DECADRON ) 4 MG tablet Take 2 tablets daily for 2 days, start the day after chemotherapy. Take with food. 30 tablet 1   docusate sodium (COLACE) 100 MG capsule Take 100 mg by mouth 3 (three) times daily as needed for mild constipation. Take 1 capsule (100 mg total) by mouth Three (3) times a day as needed for constipation.     famotidine  (PEPCID ) 20 MG tablet Take 20 mg by mouth daily.     guaiFENesin  (MUCINEX ) 600 MG 12 hr tablet Take 1,200 mg by mouth 2 (two) times daily. Take 1 tablet (1200 mg total) by mouth every twelve (12) hours.     hydrOXYzine  (VISTARIL ) 25 MG capsule Take 1 capsule (25 mg total) by mouth 3 (three) times daily. For anxiety and sleep. 90 capsule 5   ibuprofen (ADVIL) 100 MG tablet Take 200-400 mg by mouth every 6 (six) hours as needed for pain.     lidocaine -prilocaine  (EMLA ) cream Apply to affected area once 30 g 3   loratadine (CLARITIN) 10 MG tablet Take 10 mg by mouth daily.     magnesium oxide (MAG-OX) 400 (240 Mg) MG tablet Take 400 mg by mouth daily. Take 1 tablet (400 mg total) by mouth daily.     melatonin 1 MG TABS tablet Take 1 mg by mouth at bedtime. Take 5 tablets (5 mg total) by mouth nightly.     montelukast  (SINGULAIR ) 10 MG tablet Take 1 tablet (10 mg total) by mouth at bedtime. (Patient taking differently: Take 10 mg by mouth daily as needed (allergies).) 90 tablet 3   omeprazole (PRILOSEC) 20 MG capsule Take 20 mg by mouth daily.     ondansetron  (ZOFRAN ) 8 MG tablet Take 1 tablet (8 mg total) by mouth every 8 (eight)  hours as needed for nausea or vomiting. Start on the third day after chemotherapy. 30 tablet 1   ondansetron  (ZOFRAN -ODT) 4 MG disintegrating tablet Take 1 tablet (4 mg total) by mouth every 8 (eight) hours as needed for nausea or vomiting. 30 tablet 5   PACLitaxel  (TAXOL  IV) Inject into the vein.     Palonosetron  HCl (ALOXI  IV) Inject into the vein.     prochlorperazine  (COMPAZINE ) 10 MG tablet Take 1 tablet (10 mg total) by mouth every 6 (six) hours as needed for nausea or vomiting. 30 tablet 1   sennosides-docusate sodium (SENOKOT-S) 8.6-50 MG tablet Take 2 tablets by mouth daily.     traZODone  (DESYREL ) 150 MG tablet USE FROM 1/3 TO 1 TABLET NIGHTLY AS NEEDED FOR SLEEP. 90 tablet 1   butalbital-acetaminophen -caffeine (FIORICET) 50-325-40 MG tablet Take 1 tablet by mouth every 4 (four)  hours as needed for headache. 14 tablet 1   traMADol  (ULTRAM ) 50 MG tablet Take 50 mg by mouth every 6 (six) hours as needed. (Patient not taking: Reported on 12/12/2023)     No current facility-administered medications for this visit.   Facility-Administered Medications Ordered in Other Visits  Medication Dose Route Frequency Provider Last Rate Last Admin   0.9 %  sodium chloride  infusion   Intravenous Continuous Brittain Hosie, MD   Stopped at 12/12/23 1408   sodium chloride  flush (NS) 0.9 % injection 10 mL  10 mL Intracatheter PRN Chanin Frumkin, MD   10 mL at 12/12/23 1433    REVIEW OF SYSTEMS:   Constitutional: Denies fevers, chills or abnormal weight loss Eyes: Denies blurriness of vision Ears, nose, mouth, throat, and face: Denies mucositis or sore throat Respiratory: Denies cough, dyspnea or wheezes Cardiovascular: Denies palpitation, chest discomfort or lower extremity swelling Gastrointestinal:  Denies nausea, heartburn or change in bowel habits Skin: Denies abnormal skin rashes Lymphatics: Denies new lymphadenopathy or easy bruising Neurological:Denies numbness, tingling or new  weaknesses Behavioral/Psych: Mood is stable, no new changes  All other systems were reviewed with the patient and are negative.   VITALS:  Weight 140 lb 3.4 oz (63.6 kg), last menstrual period 07/26/2019.  Wt Readings from Last 3 Encounters:  12/12/23 140 lb 3.4 oz (63.6 kg)  12/04/23 142 lb 8 oz (64.6 kg)  11/27/23 139 lb 15.9 oz (63.5 kg)    Body mass index is 24.07 kg/m.  Performance status (ECOG): 0 - Asymptomatic  PHYSICAL EXAM:   GENERAL:alert, no distress and comfortable SKIN: Port insertion surgical site tender, erythematous.  No pus. LYMPH:  no palpable lymphadenopathy in the cervical, axillary or inguinal LUNGS: clear to auscultation and percussion with normal breathing effort HEART: regular rate & rhythm and no murmurs and no lower extremity edema ABDOMEN:abdomen soft, non-tender and normal bowel sounds Musculoskeletal:no cyanosis of digits and no clubbing  NEURO: alert & oriented x 3 with fluent speech.  Postsurgical scar on the right occipital region.  LABORATORY DATA:  I have reviewed the data as listed  Lab Results  Component Value Date   WBC 3.5 (L) 12/12/2023   NEUTROABS 2.4 12/12/2023   HGB 13.0 12/12/2023   HCT 37.4 12/12/2023   MCV 96.4 12/12/2023   PLT 197 12/12/2023      Chemistry      Component Value Date/Time   NA 133 (L) 12/12/2023 0940   NA 145 (H) 05/22/2023 1054   K 3.9 12/12/2023 0940   CL 100 12/12/2023 0940   CO2 24 12/12/2023 0940   BUN 10 12/12/2023 0940   BUN 7 05/22/2023 1054   CREATININE 0.47 12/12/2023 0940   GLU 83 08/25/2015 0000      Component Value Date/Time   CALCIUM 9.2 12/12/2023 0940   ALKPHOS 52 12/12/2023 0940   AST 12 (L) 12/12/2023 0940   ALT 16 12/12/2023 0940   BILITOT 0.6 12/12/2023 0940   BILITOT 0.5 05/22/2023 1054       RADIOGRAPHIC STUDIES: None to review

## 2023-12-12 NOTE — Assessment & Plan Note (Signed)
 Scar from port placement is irritated and tender.  No signs of infection.  Improved with silicone patches  - Continue using silicone patches to the scar. - Use Tylenol  for pain management as needed.

## 2023-12-12 NOTE — Assessment & Plan Note (Signed)
 Patient quit smoking since a diagnosis of lung cancer  - Encouraged to abstain from smoking

## 2023-12-12 NOTE — Progress Notes (Signed)

## 2023-12-12 NOTE — Assessment & Plan Note (Signed)
 Patient has leukopenia without neutropenia at this time. Likely chemotherapy and radiation therapy-induced  - Continue to monitor for now

## 2023-12-12 NOTE — Patient Instructions (Signed)
 [facility] [place] - A DEPT OF [facility]  Discharge Instructions: Thank you for choosing [facility] to provide your oncology and hematology care.  If you have a lab appointment with the Cancer Center - please note that after April 8th, 2024, all labs will be drawn in the cancer center.  You do not have to check in or register with the main entrance as you have in the past but will complete your check-in in the cancer center.  Wear comfortable clothing and clothing appropriate for easy access to any Portacath or PICC line.   We strive to give you quality time with your provider. You may need to reschedule your appointment if you arrive late (15 or more minutes).  Arriving late affects you and other patients whose appointments are after yours.  Also, if you miss three or more appointments without notifying the office, you may be dismissed from the clinic at the provider's discretion.      For prescription refill requests, have your pharmacy contact our office and allow 72 hours for refills to be completed.    Today you received the following chemotherapy and/or immunotherapy agents taxol  and Rebekah [place]      To help prevent nausea and vomiting after your treatment, we encourage you to take your nausea medication as directed.  BELOW ARE SYMPTOMS THAT SHOULD BE REPORTED IMMEDIATELY: *FEVER GREATER THAN 100.4 F (38 C) OR HIGHER *CHILLS OR SWEATING *NAUSEA AND VOMITING THAT IS NOT CONTROLLED WITH YOUR NAUSEA MEDICATION *UNUSUAL SHORTNESS OF BREATH *UNUSUAL BRUISING OR BLEEDING *URINARY PROBLEMS (pain or burning when urinating, or frequent urination) *BOWEL PROBLEMS (unusual diarrhea, constipation, pain near the anus) TENDERNESS IN MOUTH AND THROAT WITH OR WITHOUT PRESENCE OF ULCERS (sore throat, sores in mouth, or a toothache) UNUSUAL RASH, SWELLING OR PAIN  UNUSUAL VAGINAL DISCHARGE OR ITCHING   Items with * indicate a potential emergency and should be  followed up as soon as possible or go to the Emergency Department if any problems should occur.  Please show the CHEMOTHERAPY ALERT CARD or IMMUNOTHERAPY ALERT CARD at check-in to the Emergency Department and triage nurse.  Should you have questions after your visit or need to cancel or reschedule your appointment, please contact [facility]  - A DEPT OF [facility] [phone]  and follow the prompts.  Office hours are 8:00 a.m. to 4:30 p.m. Monday - Friday. Please note that voicemails left after 4:00 p.m. may not be returned until the following business day.  We are closed weekends and major holidays. You have access to a nurse at all times for urgent questions. Please call the main number to the clinic [phone] and follow the prompts.  For any non-urgent questions, you may also contact your provider using MyChart. We now offer e-Visits for anyone 49 and older to request care online for non-urgent symptoms. For details visit [email or web address].   Also download the MyChart app! Go to the app store, search "MyChart", open the app, select [place], and log in with your MyChart username and password.

## 2023-12-12 NOTE — Assessment & Plan Note (Signed)
 Patient has chemotherapy-induced nausea.  Now better controlled with Compazine   - Continue prescribed antinausea medications

## 2023-12-13 DIAGNOSIS — Z3202 Encounter for pregnancy test, result negative: Secondary | ICD-10-CM | POA: Diagnosis not present

## 2023-12-13 DIAGNOSIS — Z825 Family history of asthma and other chronic lower respiratory diseases: Secondary | ICD-10-CM | POA: Diagnosis not present

## 2023-12-13 DIAGNOSIS — I7300 Raynaud's syndrome without gangrene: Secondary | ICD-10-CM | POA: Diagnosis not present

## 2023-12-13 DIAGNOSIS — R220 Localized swelling, mass and lump, head: Secondary | ICD-10-CM | POA: Diagnosis not present

## 2023-12-13 DIAGNOSIS — J45909 Unspecified asthma, uncomplicated: Secondary | ICD-10-CM | POA: Diagnosis not present

## 2023-12-13 DIAGNOSIS — F419 Anxiety disorder, unspecified: Secondary | ICD-10-CM | POA: Diagnosis not present

## 2023-12-13 DIAGNOSIS — G936 Cerebral edema: Secondary | ICD-10-CM | POA: Diagnosis not present

## 2023-12-13 DIAGNOSIS — Z87891 Personal history of nicotine dependence: Secondary | ICD-10-CM | POA: Diagnosis not present

## 2023-12-13 DIAGNOSIS — J439 Emphysema, unspecified: Secondary | ICD-10-CM | POA: Diagnosis not present

## 2023-12-13 DIAGNOSIS — F4310 Post-traumatic stress disorder, unspecified: Secondary | ICD-10-CM | POA: Diagnosis not present

## 2023-12-13 DIAGNOSIS — C3411 Malignant neoplasm of upper lobe, right bronchus or lung: Secondary | ICD-10-CM | POA: Diagnosis not present

## 2023-12-13 DIAGNOSIS — K219 Gastro-esophageal reflux disease without esophagitis: Secondary | ICD-10-CM | POA: Diagnosis not present

## 2023-12-13 DIAGNOSIS — F32A Depression, unspecified: Secondary | ICD-10-CM | POA: Diagnosis not present

## 2023-12-14 DIAGNOSIS — C3411 Malignant neoplasm of upper lobe, right bronchus or lung: Secondary | ICD-10-CM | POA: Diagnosis not present

## 2023-12-14 DIAGNOSIS — I7300 Raynaud's syndrome without gangrene: Secondary | ICD-10-CM | POA: Diagnosis not present

## 2023-12-14 DIAGNOSIS — Z87891 Personal history of nicotine dependence: Secondary | ICD-10-CM | POA: Diagnosis not present

## 2023-12-14 DIAGNOSIS — Z3202 Encounter for pregnancy test, result negative: Secondary | ICD-10-CM | POA: Diagnosis not present

## 2023-12-14 DIAGNOSIS — R220 Localized swelling, mass and lump, head: Secondary | ICD-10-CM | POA: Diagnosis not present

## 2023-12-14 DIAGNOSIS — J439 Emphysema, unspecified: Secondary | ICD-10-CM | POA: Diagnosis not present

## 2023-12-14 DIAGNOSIS — G936 Cerebral edema: Secondary | ICD-10-CM | POA: Diagnosis not present

## 2023-12-14 DIAGNOSIS — K219 Gastro-esophageal reflux disease without esophagitis: Secondary | ICD-10-CM | POA: Diagnosis not present

## 2023-12-14 DIAGNOSIS — F32A Depression, unspecified: Secondary | ICD-10-CM | POA: Diagnosis not present

## 2023-12-14 DIAGNOSIS — F4310 Post-traumatic stress disorder, unspecified: Secondary | ICD-10-CM | POA: Diagnosis not present

## 2023-12-14 DIAGNOSIS — F419 Anxiety disorder, unspecified: Secondary | ICD-10-CM | POA: Diagnosis not present

## 2023-12-14 DIAGNOSIS — J45909 Unspecified asthma, uncomplicated: Secondary | ICD-10-CM | POA: Diagnosis not present

## 2023-12-14 DIAGNOSIS — Z825 Family history of asthma and other chronic lower respiratory diseases: Secondary | ICD-10-CM | POA: Diagnosis not present

## 2023-12-15 DIAGNOSIS — F4310 Post-traumatic stress disorder, unspecified: Secondary | ICD-10-CM | POA: Diagnosis not present

## 2023-12-15 DIAGNOSIS — C3411 Malignant neoplasm of upper lobe, right bronchus or lung: Secondary | ICD-10-CM | POA: Diagnosis not present

## 2023-12-15 DIAGNOSIS — Z825 Family history of asthma and other chronic lower respiratory diseases: Secondary | ICD-10-CM | POA: Diagnosis not present

## 2023-12-15 DIAGNOSIS — F32A Depression, unspecified: Secondary | ICD-10-CM | POA: Diagnosis not present

## 2023-12-15 DIAGNOSIS — Z87891 Personal history of nicotine dependence: Secondary | ICD-10-CM | POA: Diagnosis not present

## 2023-12-15 DIAGNOSIS — I7300 Raynaud's syndrome without gangrene: Secondary | ICD-10-CM | POA: Diagnosis not present

## 2023-12-15 DIAGNOSIS — J439 Emphysema, unspecified: Secondary | ICD-10-CM | POA: Diagnosis not present

## 2023-12-15 DIAGNOSIS — F419 Anxiety disorder, unspecified: Secondary | ICD-10-CM | POA: Diagnosis not present

## 2023-12-15 DIAGNOSIS — K219 Gastro-esophageal reflux disease without esophagitis: Secondary | ICD-10-CM | POA: Diagnosis not present

## 2023-12-15 DIAGNOSIS — J45909 Unspecified asthma, uncomplicated: Secondary | ICD-10-CM | POA: Diagnosis not present

## 2023-12-15 DIAGNOSIS — Z3202 Encounter for pregnancy test, result negative: Secondary | ICD-10-CM | POA: Diagnosis not present

## 2023-12-15 DIAGNOSIS — R220 Localized swelling, mass and lump, head: Secondary | ICD-10-CM | POA: Diagnosis not present

## 2023-12-15 DIAGNOSIS — G936 Cerebral edema: Secondary | ICD-10-CM | POA: Diagnosis not present

## 2023-12-19 ENCOUNTER — Inpatient Hospital Stay

## 2023-12-19 ENCOUNTER — Inpatient Hospital Stay (HOSPITAL_BASED_OUTPATIENT_CLINIC_OR_DEPARTMENT_OTHER): Admitting: Oncology

## 2023-12-19 VITALS — BP 122/75 | HR 92

## 2023-12-19 DIAGNOSIS — Z87891 Personal history of nicotine dependence: Secondary | ICD-10-CM | POA: Diagnosis not present

## 2023-12-19 DIAGNOSIS — F17200 Nicotine dependence, unspecified, uncomplicated: Secondary | ICD-10-CM | POA: Diagnosis not present

## 2023-12-19 DIAGNOSIS — I7300 Raynaud's syndrome without gangrene: Secondary | ICD-10-CM | POA: Diagnosis not present

## 2023-12-19 DIAGNOSIS — C7801 Secondary malignant neoplasm of right lung: Secondary | ICD-10-CM

## 2023-12-19 DIAGNOSIS — F32A Depression, unspecified: Secondary | ICD-10-CM | POA: Diagnosis not present

## 2023-12-19 DIAGNOSIS — Z825 Family history of asthma and other chronic lower respiratory diseases: Secondary | ICD-10-CM | POA: Diagnosis not present

## 2023-12-19 DIAGNOSIS — L905 Scar conditions and fibrosis of skin: Secondary | ICD-10-CM

## 2023-12-19 DIAGNOSIS — Z79899 Other long term (current) drug therapy: Secondary | ICD-10-CM | POA: Diagnosis not present

## 2023-12-19 DIAGNOSIS — G939 Disorder of brain, unspecified: Secondary | ICD-10-CM | POA: Diagnosis not present

## 2023-12-19 DIAGNOSIS — R220 Localized swelling, mass and lump, head: Secondary | ICD-10-CM | POA: Diagnosis not present

## 2023-12-19 DIAGNOSIS — J439 Emphysema, unspecified: Secondary | ICD-10-CM | POA: Diagnosis not present

## 2023-12-19 DIAGNOSIS — K219 Gastro-esophageal reflux disease without esophagitis: Secondary | ICD-10-CM | POA: Diagnosis not present

## 2023-12-19 DIAGNOSIS — D72810 Lymphocytopenia: Secondary | ICD-10-CM

## 2023-12-19 DIAGNOSIS — G936 Cerebral edema: Secondary | ICD-10-CM | POA: Diagnosis not present

## 2023-12-19 DIAGNOSIS — C779 Secondary and unspecified malignant neoplasm of lymph node, unspecified: Secondary | ICD-10-CM | POA: Diagnosis not present

## 2023-12-19 DIAGNOSIS — C7931 Secondary malignant neoplasm of brain: Secondary | ICD-10-CM

## 2023-12-19 DIAGNOSIS — C3411 Malignant neoplasm of upper lobe, right bronchus or lung: Secondary | ICD-10-CM | POA: Diagnosis not present

## 2023-12-19 DIAGNOSIS — J45909 Unspecified asthma, uncomplicated: Secondary | ICD-10-CM | POA: Diagnosis not present

## 2023-12-19 DIAGNOSIS — Z3202 Encounter for pregnancy test, result negative: Secondary | ICD-10-CM | POA: Diagnosis not present

## 2023-12-19 DIAGNOSIS — F17210 Nicotine dependence, cigarettes, uncomplicated: Secondary | ICD-10-CM | POA: Diagnosis not present

## 2023-12-19 DIAGNOSIS — Z5111 Encounter for antineoplastic chemotherapy: Secondary | ICD-10-CM | POA: Diagnosis not present

## 2023-12-19 DIAGNOSIS — F4310 Post-traumatic stress disorder, unspecified: Secondary | ICD-10-CM | POA: Diagnosis not present

## 2023-12-19 DIAGNOSIS — F419 Anxiety disorder, unspecified: Secondary | ICD-10-CM | POA: Diagnosis not present

## 2023-12-19 DIAGNOSIS — R110 Nausea: Secondary | ICD-10-CM

## 2023-12-19 LAB — COMPREHENSIVE METABOLIC PANEL WITH GFR
ALT: 19 U/L (ref 0–44)
AST: 14 U/L — ABNORMAL LOW (ref 15–41)
Albumin: 3.5 g/dL (ref 3.5–5.0)
Alkaline Phosphatase: 51 U/L (ref 38–126)
Anion gap: 8 (ref 5–15)
BUN: 9 mg/dL (ref 6–20)
CO2: 25 mmol/L (ref 22–32)
Calcium: 8.9 mg/dL (ref 8.9–10.3)
Chloride: 105 mmol/L (ref 98–111)
Creatinine, Ser: 0.45 mg/dL (ref 0.44–1.00)
GFR, Estimated: >60 mL/min (ref >60)
Glucose, Bld: 93 mg/dL (ref 70–99)
Potassium: 3.8 mmol/L (ref 3.5–5.1)
Sodium: 138 mmol/L (ref 135–145)
Total Bilirubin: 0.6 mg/dL (ref 0.0–1.2)
Total Protein: 6.2 g/dL — ABNORMAL LOW (ref 6.5–8.1)

## 2023-12-19 LAB — CBC WITH DIFFERENTIAL/PLATELET
Abs Immature Granulocytes: 0.01 10*3/uL (ref 0.00–0.07)
Basophils Absolute: 0 10*3/uL (ref 0.0–0.1)
Basophils Relative: 1 %
Eosinophils Absolute: 0.1 10*3/uL (ref 0.0–0.5)
Eosinophils Relative: 2 %
HCT: 34.4 % — ABNORMAL LOW (ref 36.0–46.0)
Hemoglobin: 11.9 g/dL — ABNORMAL LOW (ref 12.0–15.0)
Immature Granulocytes: 0 %
Lymphocytes Relative: 17 %
Lymphs Abs: 0.4 10*3/uL — ABNORMAL LOW (ref 0.7–4.0)
MCH: 33.1 pg (ref 26.0–34.0)
MCHC: 34.6 g/dL (ref 30.0–36.0)
MCV: 95.8 fL (ref 80.0–100.0)
Monocytes Absolute: 0.3 10*3/uL (ref 0.1–1.0)
Monocytes Relative: 12 %
Neutro Abs: 1.7 10*3/uL (ref 1.7–7.7)
Neutrophils Relative %: 68 %
Platelets: 181 10*3/uL (ref 150–400)
RBC: 3.59 MIL/uL — ABNORMAL LOW (ref 3.87–5.11)
RDW: 12.6 % (ref 11.5–15.5)
WBC: 2.5 10*3/uL — ABNORMAL LOW (ref 4.0–10.5)
nRBC: 0 % (ref 0.0–0.2)

## 2023-12-19 LAB — MAGNESIUM: Magnesium: 2.4 mg/dL (ref 1.7–2.4)

## 2023-12-19 MED ORDER — FAMOTIDINE IN NACL 20-0.9 MG/50ML-% IV SOLN
20.0000 mg | Freq: Once | INTRAVENOUS | Status: AC
  Administered 2023-12-19: 20 mg via INTRAVENOUS

## 2023-12-19 MED ORDER — CETIRIZINE HCL 10 MG/ML IV SOLN
10.0000 mg | Freq: Once | INTRAVENOUS | Status: AC
  Administered 2023-12-19: 10 mg via INTRAVENOUS
  Filled 2023-12-19: qty 1

## 2023-12-19 MED ORDER — HEPARIN SOD (PORK) LOCK FLUSH 100 UNIT/ML IV SOLN
500.0000 [IU] | Freq: Once | INTRAVENOUS | Status: AC | PRN
  Administered 2023-12-19: 500 [IU]

## 2023-12-19 MED ORDER — SODIUM CHLORIDE 0.9 % IV SOLN
275.0000 mg | Freq: Once | INTRAVENOUS | Status: AC
  Administered 2023-12-19: 280 mg via INTRAVENOUS
  Filled 2023-12-19: qty 28

## 2023-12-19 MED ORDER — SODIUM CHLORIDE 0.9 % IV SOLN: INTRAVENOUS | Status: DC

## 2023-12-19 MED ORDER — SODIUM CHLORIDE 0.9% FLUSH
10.0000 mL | Freq: Once | INTRAVENOUS | Status: AC
  Administered 2023-12-19: 10 mL via INTRAVENOUS

## 2023-12-19 MED ORDER — DEXAMETHASONE SODIUM PHOSPHATE 10 MG/ML IJ SOLN
10.0000 mg | Freq: Once | INTRAMUSCULAR | Status: AC
  Administered 2023-12-19: 10 mg via INTRAVENOUS
  Filled 2023-12-19: qty 1

## 2023-12-19 MED ORDER — SODIUM CHLORIDE 0.9 % IV SOLN
45.0000 mg/m2 | Freq: Once | INTRAVENOUS | Status: AC
  Administered 2023-12-19: 72 mg via INTRAVENOUS
  Filled 2023-12-19: qty 12

## 2023-12-19 MED ORDER — PALONOSETRON HCL INJECTION 0.25 MG/5ML
0.2500 mg | Freq: Once | INTRAVENOUS | Status: AC
  Administered 2023-12-19: 0.25 mg via INTRAVENOUS
  Filled 2023-12-19: qty 5

## 2023-12-19 NOTE — Assessment & Plan Note (Signed)
 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 Patient completed gamma knife at [facility].  Currently on a clinical trial assessing gamma knife versus gamma tile Completed steroid taper.  - Continue to follow with [facility]. - MRI protocol per the study - Patient reports headaches that are only manageable with Fioricet.  Recommended to follow-up regarding headaches with [facility] team.  Patient reached out to the team and is awaiting on reply.

## 2023-12-19 NOTE — Progress Notes (Signed)
 Patient tolerated chemotherapy with no complaints voiced.  Side effects with management reviewed with understanding verbalized.  Port site clean and dry with no bruising or swelling noted at site.  Good blood return noted before and after administration of chemotherapy.  Band aid applied.  Patient left in satisfactory condition with VSS and no s/s of distress noted. All follow ups as scheduled.   Venkat Ankney [employer]

## 2023-12-19 NOTE — Assessment & Plan Note (Addendum)
 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   S/p cerebellar lesion resection consistent with lung primary.   Oncology history as below. PET scan showed limited disease to lung and lymph nodes.  Discussed at tumor board, surgery is not a feasible option secondary to lymph node involvement.  Recommended chemoradiation. Caris: PD-L1: 100%  -Started thoracic radiation. C4D1 today.  Reports some nausea manageable with Compazine .  Labs and physical exam stable today and will proceed with chemotherapy.  Reemphasized common side effects including immunosuppression, nausea, vomiting. -Has some leukopenia on labs today.  Normal ANC.  Continue with chemo RT -For nausea please take Compazine  as prescribed.  Zofran  can be taken after day 3.  Will hold off on steroids at this time -Discussed that we will obtain a CT scan to assess response 4 weeks after completing chemo RT and if stable or response is better we will do immunotherapy for 1 year with durvalumab -Will coordinate care and imaging with Dr. Cipriano Creeks, radiation oncologist.  Return to clinic in 1 week before fifth cycle of chemotherapy

## 2023-12-19 NOTE — Assessment & Plan Note (Signed)
 Patient quit smoking since a diagnosis of lung cancer  - Encouraged to abstain from smoking

## 2023-12-19 NOTE — Patient Instructions (Signed)
 [facility] [place] - A DEPT OF [facility]  Discharge Instructions: Thank you for choosing [facility] to provide your oncology and hematology care.  If you have a lab appointment with the Cancer Center - please note that after April 8th, 2024, all labs will be drawn in the cancer center.  You do not have to check in or register with the main entrance as you have in the past but will complete your check-in in the cancer center.  Wear comfortable clothing and clothing appropriate for easy access to any Portacath or PICC line.   We strive to give you quality time with your provider. You may need to reschedule your appointment if you arrive late (15 or more minutes).  Arriving late affects you and other patients whose appointments are after yours.  Also, if you miss three or more appointments without notifying the office, you may be dismissed from the clinic at the provider's discretion.      For prescription refill requests, have your pharmacy contact our office and allow 72 hours for refills to be completed.    Today you received the following chemotherapy and/or immunotherapy agents Taxol/Carboplatin      To help prevent nausea and vomiting after your treatment, we encourage you to take your nausea medication as directed.  BELOW ARE SYMPTOMS THAT SHOULD BE REPORTED IMMEDIATELY: *FEVER GREATER THAN 100.4 F (38 C) OR HIGHER *CHILLS OR SWEATING *NAUSEA AND VOMITING THAT IS NOT CONTROLLED WITH YOUR NAUSEA MEDICATION *UNUSUAL SHORTNESS OF BREATH *UNUSUAL BRUISING OR BLEEDING *URINARY PROBLEMS (pain or burning when urinating, or frequent urination) *BOWEL PROBLEMS (unusual diarrhea, constipation, pain near the anus) TENDERNESS IN MOUTH AND THROAT WITH OR WITHOUT PRESENCE OF ULCERS (sore throat, sores in mouth, or a toothache) UNUSUAL RASH, SWELLING OR PAIN  UNUSUAL VAGINAL DISCHARGE OR ITCHING   Items with * indicate a potential emergency and should be  followed up as soon as possible or go to the Emergency Department if any problems should occur.  Please show the CHEMOTHERAPY ALERT CARD or IMMUNOTHERAPY ALERT CARD at check-in to the Emergency Department and triage nurse.  Should you have questions after your visit or need to cancel or reschedule your appointment, please contact [facility] [place] - A DEPT OF Eligha Bridegroom [facility] [phone]  and follow the prompts.  Office hours are 8:00 a.m. to 4:30 p.m. Monday - Friday. Please note that voicemails left after 4:00 p.m. may not be returned until the following business day.  We are closed weekends and major holidays. You have access to a nurse at all times for urgent questions. Please call the main number to the clinic [phone] and follow the prompts.  For any non-urgent questions, you may also contact your provider using MyChart. We now offer e-Visits for anyone 45 and older to request care online for non-urgent symptoms. For details visit [email or web address].   Also download the MyChart app! Go to the app store, search "MyChart", open the app, select [place], and log in with your MyChart username and password.

## 2023-12-19 NOTE — Assessment & Plan Note (Signed)
 Scar from port placement is irritated and tender.  No signs of infection.  Improved with silicone patches  - Continue using silicone patches to the scar. - Use Tylenol  for pain management as needed.

## 2023-12-19 NOTE — Patient Instructions (Signed)
 VISIT SUMMARY:  Today, we discussed your ongoing symptoms related to chemotherapy and radiation therapy, including fatigue, leukopenia, mild anemia, nausea, and skin sensitivity. We also reviewed your recent improvements in nausea and headaches.  YOUR PLAN:  -LEUKOPENIA: Leukopenia is a condition where you have a lower than normal white blood cell count, which can make you more susceptible to infections. We will continue your chemotherapy and radiation therapy while monitoring your white blood cell count. Please seek emergency care if you develop a fever of 100.40F or higher.  -ANEMIA: Anemia is a condition where you have a lower than normal red blood cell count, which can cause fatigue and weakness. Your hemoglobin level is slightly below normal at 11.9 g/dL, likely due to your treatments. We will continue to monitor your hemoglobin levels.  -FATIGUE: Fatigue is extreme tiredness and lack of energy, which is likely due to your leukopenia and ongoing treatments. This may improve before your treatments conclude.  -NAUSEA: Nausea is the feeling of needing to vomit, which is common with chemotherapy. Your nausea has improved, but you still experience some queasiness. This typically worsens before it gets better.  -HEADACHES: Your headaches have improved significantly with Fioricet, and you have not experienced any headaches recently. Continue using Fioricet as needed for headache management.  INSTRUCTIONS:  Please continue with your current chemotherapy and radiation therapy. Monitor your white blood cell count and hemoglobin levels as advised. Seek emergency care if you develop a fever of 100.40F or higher. Continue using scar tape for skin sensitivity and Fioricet for headaches as needed.

## 2023-12-19 NOTE — Assessment & Plan Note (Signed)
 Patient has leukopenia without neutropenia at this time. Likely chemotherapy and radiation therapy-induced  - Continue to monitor for now

## 2023-12-19 NOTE — Assessment & Plan Note (Signed)
 Patient has chemotherapy-induced nausea.  Now better controlled with Compazine   - Continue prescribed antinausea medications

## 2023-12-19 NOTE — Progress Notes (Signed)
 Patient Care Team: Roise Cleaver, MD as PCP - General (Family Medicine) [name], MD as Consulting Physician (Rheumatology) [name], MD as Medical Oncologist (Medical Oncology) Gerhard Knuckles, RN as Oncology Nurse Navigator (Medical Oncology) Marc Senior, MD as Consulting Physician (Pulmonary Disease)  Clinic Day:  12/19/2023  Referring physician: Roise Cleaver, MD   CHIEF COMPLAINT:  CC: Metastatic lung adenocarcinoma   ASSESSMENT & PLAN:   Assessment & Plan: Julie Horn  is a 48 y.o. 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   S/p cerebellar lesion resection consistent with lung primary.   Oncology history as below. PET scan showed limited disease to lung and lymph nodes.  Discussed at tumor board, surgery is not a feasible option secondary to lymph node involvement.  Recommended chemoradiation. Caris: PD-L1: 100%  -Started thoracic radiation. C4D1 today.  Reports some nausea manageable with Compazine .  Labs and physical exam stable today and will proceed with chemotherapy.  Reemphasized common side effects including immunosuppression, nausea, vomiting. -Has some leukopenia on labs today.  Normal ANC.  Continue with chemo RT -For nausea please take Compazine  as prescribed.  Zofran  can be taken after day 3.  Will hold off on steroids at this time -Discussed that we will obtain a CT scan to assess response 4 weeks after completing chemo RT and if stable or response is better we will do immunotherapy for 1 year with durvalumab -Will coordinate care and imaging with Dr. Cipriano Creeks, radiation oncologist.  Return to clinic in 1 week before fifth cycle of chemotherapy   Metastasis to brain [facility]) Cerebellar lesion s/p resection consistent with the poorly differentiated adenocarcinoma of lung primary. Patient  completed gamma knife at [facility].  Currently on a clinical trial assessing gamma knife versus gamma tile Completed steroid taper.  - Continue to follow with [facility]. - MRI protocol per the study -Patient reports headaches that are only manageable with Fioricet.  Recommended to follow-up regarding headaches with [facility] team.  Patient reached out to the team and is awaiting on reply.  Scar pain Scar from port placement is irritated and tender.  No signs of infection.  Improved with silicone patches  - Continue using silicone patches to the scar. - Use Tylenol  for pain management as needed.  Nicotine dependence with current use Patient quit smoking since a diagnosis of lung cancer  - Encouraged to abstain from smoking  Nausea without vomiting Patient has chemotherapy-induced nausea.  Now better controlled with Compazine   - Continue prescribed antinausea medications  Leukopenia Patient has leukopenia without neutropenia at this time. Likely chemotherapy and radiation therapy-induced  - Continue to monitor for now   The patient understands the plans discussed today and is in agreement with them.  She knows to contact our office if she develops concerns prior to her next appointment.  I provided 20 minutes of face-to-face time during this encounter and > 50% was spent counseling as documented under my assessment and plan.    [name], MD  [facility] [place] - A DEPT OF [facility] [address] [place] [address] Dept: [phone] Dept Fax: [phone]      ONCOLOGY HISTORY:   Oncology History  Metastatic lung carcinoma, right (HCC)  09/25/2023 Imaging   CT CAP w contrast:  1. Spiculated mass in the posterior right pulmonary apex measuring  2.6 x 1.4 cm, consistent with primary bronchogenic malignancy. 2. Enlarged right hilar lymph node measuring 1.5 x 1.2 cm, consistent with nodal metastatic disease. 3. Tiny  hypodensities in the central liver, incompletely characterized, although statistically likely benign cysts or hemangiomas. Small metastases not strictly excluded. 4. Soft tissue nodule within the right gluteal subcutaneous fat;given location most likely an injection granuloma, however subcutaneous soft tissue metastases are occasionally seen in metastatic lung malignancy. Attention on follow-up.     09/25/2023 Imaging   MRI brain w wo contrast:  1. Heterogeneously contrast-enhancing mass of the right cerebellar hemisphere with a large amount of surrounding vasogenic edema. Metastasis and hemangioblastoma are the most common infratentorial tumors in adults. Astrocytoma is another possibility. 2. Mass effect on the fourth ventricle without hydrocephalus.   09/27/2023 Initial Diagnosis   Metastatic lung carcinoma, right (HCC)   10/05/2023 PET scan   IMPRESSION: 1. Hypermetabolic RIGHT upper lobe lung nodule consistent primary bronchogenic carcinoma. 2. Mildly hypermetabolic RIGHT lower paratracheal and RIGHT hilar lymph nodes are concerning for nodal metastasis. No contralateral hypermetabolic lymph nodes. 3. No evidence of distant metastatic disease. 4. Intense activity through the distal anal canal is favored physiologic. Recommend digital rectal exam.   10/11/2023 Procedure   Resection of cerebellar metastasis   10/11/2023 Pathology Results   Diagnosis   A: Brain tumor, biopsy - Metastatic poorly differentiated adenocarcinoma consistent with lung primary.   B: Brain tumor, right, resection - Metastatic poorly differentiated adenocarcinoma consistent with lung primary.   C: Brain tumor, right, specimen trap - Metastatic poorly differentiated adenocarcinoma consistent with lung primary.     10/26/2023 Imaging   MRI brain:  Status post right occipital craniotomy for mass resection. Marked reduction in edema and mass effect, with a now normal appearance of the fourth ventricle. Blood  products are present in the region of the approach and resection, with a small amount of adjacent enhancement as would be expected at this time. The study is indeterminate for residual marginal tumor and additional follow-up is warranted.   10/27/2023 Pathology Results   Caris:  PD-L1: Positive, TPS: 100%, 3+ TMB: High, 15 mutation per MB  ALK, EGFR, KRAS, MET, RET, ROS1: Negative   11/03/2023 Procedure   Port placement   11/12/2023 - 11/17/2023 Radiation Therapy   5 fractions of SBRT for R cerebellar bed   11/27/2023 -  Chemotherapy   Patient is on Treatment Plan : LUNG Carboplatin  + Paclitaxel  + XRT q7d         Current Treatment:  Chemo RT  INTERVAL HISTORY:  Julie Horn is here today for follow up.  Patient has significant fatigue after the previous cycle of chemotherapy but got some rest during the long weekend.Her nausea has improved over the past week, although she still experiences occasional queasiness. She has started taking vitamin B12 and folic acid to help with fatigue.Headaches have improved significantly with Fioricet, and she did not experience any headaches last week.  No new complaints aside from fatigue and queasiness. Improvement in nausea and headaches, and no current pain.  I have reviewed the past medical history, past surgical history, social history and family history with the patient and they are unchanged from previous note.  ALLERGIES:  is allergic to levofloxacin, sulfa antibiotics, azithromycin, doxycycline , latex, and wound dressing adhesive.  MEDICATIONS:  Current Outpatient Medications  Medication Sig Dispense Refill   acetaminophen  (TYLENOL ) 500 MG tablet Take 1,000 mg by mouth every 6 (six) hours as needed for mild pain (pain  score 1-3). Take 2 tablets (1,000 mg total) by mouth every six (6) hours.     albuterol  (VENTOLIN  HFA) 108 (90 Base) MCG/ACT inhaler Inhale 2 puffs into the lungs every 4 (four) hours as needed for wheezing or shortness of  breath. 6.7 g 2   ascorbic acid (VITAMIN C) 500 MG tablet Take 500 mg by mouth daily. Take 1 tablet (500 mg total) by mouth daily     budesonide -formoterol  (SYMBICORT ) 160-4.5 MCG/ACT inhaler Inhale 2 puffs into the lungs 2 (two) times daily. Rinse mouth well after use. 1 each 11   butalbital-acetaminophen -caffeine (FIORICET) 50-325-40 MG tablet Take 1 tablet by mouth every 4 (four) hours as needed for headache. 14 tablet 1   CARBOPLATIN  IV Inject into the vein.     co-enzyme Q-10 30 MG capsule Take 30 mg by mouth 3 (three) times daily. Take 1 capsule (30 mg total) by mouth Three (3) times a day.     Cyanocobalamin (VITAMIN B 12) 500 MCG TABS Take 500 mg by mouth daily.     dexamethasone  (DECADRON ) 4 MG tablet Take 2 tablets daily for 2 days, start the day after chemotherapy. Take with food. 30 tablet 1   docusate sodium (COLACE) 100 MG capsule Take 100 mg by mouth 3 (three) times daily as needed for mild constipation. Take 1 capsule (100 mg total) by mouth Three (3) times a day as needed for constipation.     famotidine  (PEPCID ) 20 MG tablet Take 20 mg by mouth daily.     folic acid (FOLVITE) 400 MCG tablet Take 400 mcg by mouth daily.     guaiFENesin  (MUCINEX ) 600 MG 12 hr tablet Take 1,200 mg by mouth 2 (two) times daily. Take 1 tablet (1200 mg total) by mouth every twelve (12) hours.     hydrOXYzine  (VISTARIL ) 25 MG capsule Take 1 capsule (25 mg total) by mouth 3 (three) times daily. For anxiety and sleep. 90 capsule 5   ibuprofen (ADVIL) 100 MG tablet Take 200-400 mg by mouth every 6 (six) hours as needed for pain.     lidocaine  (XYLOCAINE ) 2 % solution Take 10 mLs by mouth.     lidocaine -prilocaine  (EMLA ) cream Apply to affected area once 30 g 3   loratadine (CLARITIN) 10 MG tablet Take 10 mg by mouth daily.     magnesium oxide (MAG-OX) 400 (240 Mg) MG tablet Take 400 mg by mouth daily. Take 1 tablet (400 mg total) by mouth daily.     melatonin 1 MG TABS tablet Take 1 mg by mouth at bedtime.  Take 5 tablets (5 mg total) by mouth nightly.     montelukast  (SINGULAIR ) 10 MG tablet Take 1 tablet (10 mg total) by mouth at bedtime. (Patient taking differently: Take 10 mg by mouth daily as needed (allergies).) 90 tablet 3   omeprazole (PRILOSEC) 20 MG capsule Take 20 mg by mouth daily.     ondansetron  (ZOFRAN ) 8 MG tablet Take 1 tablet (8 mg total) by mouth every 8 (eight) hours as needed for nausea or vomiting. Start on the third day after chemotherapy. 30 tablet 1   ondansetron  (ZOFRAN -ODT) 4 MG disintegrating tablet Take 1 tablet (4 mg total) by mouth every 8 (eight) hours as needed for nausea or vomiting. 30 tablet 5   PACLitaxel  (TAXOL  IV) Inject into the vein.     Palonosetron  HCl (ALOXI  IV) Inject into the vein.     prochlorperazine  (COMPAZINE ) 10 MG tablet Take 1 tablet (10 mg total) by  mouth every 6 (six) hours as needed for nausea or vomiting. 30 tablet 1   sennosides-docusate sodium (SENOKOT-S) 8.6-50 MG tablet Take 2 tablets by mouth daily.     traMADol  (ULTRAM ) 50 MG tablet Take 50 mg by mouth every 6 (six) hours as needed.     traZODone  (DESYREL ) 150 MG tablet USE FROM 1/3 TO 1 TABLET NIGHTLY AS NEEDED FOR SLEEP. 90 tablet 1   No current facility-administered medications for this visit.   Facility-Administered Medications Ordered in Other Visits  Medication Dose Route Frequency Provider Last Rate Last Admin   0.9 %  sodium chloride  infusion   Intravenous Continuous [name], MD 10 mL/hr at 12/19/23 1137 New Bag at 12/19/23 1137   CARBOplatin  (PARAPLATIN ) 280 mg in sodium chloride  0.9 % 100 mL chemo infusion  280 mg Intravenous Once Afsana Liera, MD       heparin  lock flush 100 unit/mL  500 Units Intracatheter Once PRN Aubrina Nieman, MD       PACLitaxel  (TAXOL ) 72 mg in sodium chloride  0.9 % 150 mL chemo infusion (</= 80mg /m2)  45 mg/m2 (Treatment Plan Recorded) Intravenous Once Marquisha Nikolov, MD 162 mL/hr at 12/19/23 1238 72 mg at 12/19/23 1238    REVIEW OF  SYSTEMS:   Constitutional: Denies fevers, chills or abnormal weight loss Eyes: Denies blurriness of vision Ears, nose, mouth, throat, and face: Denies mucositis or sore throat Respiratory: Denies cough, dyspnea or wheezes Cardiovascular: Denies palpitation, chest discomfort or lower extremity swelling Gastrointestinal:  Denies nausea, heartburn or change in bowel habits Skin: Denies abnormal skin rashes Lymphatics: Denies new lymphadenopathy or easy bruising Neurological:Denies numbness, tingling or new weaknesses Behavioral/Psych: Mood is stable, no new changes  All other systems were reviewed with the patient and are negative.   VITALS:  Last menstrual period 07/26/2019.  Wt Readings from Last 3 Encounters:  12/19/23 143 lb 8 oz (65.1 kg)  12/12/23 140 lb 3.4 oz (63.6 kg)  12/04/23 142 lb 8 oz (64.6 kg)    There is no height or weight on file to calculate BMI.  Performance status (ECOG): 0 - Asymptomatic  PHYSICAL EXAM:   GENERAL:alert, no distress and comfortable SKIN: Port insertion surgical site tender, erythematous.  No pus. LUNGS: clear to auscultation and percussion with normal breathing effort HEART: regular rate & rhythm and no murmurs and no lower extremity edema ABDOMEN:abdomen soft, non-tender and normal bowel sounds Musculoskeletal:no cyanosis of digits and no clubbing  NEURO: alert & oriented x 3 with fluent speech.  Postsurgical scar on the right occipital region.  LABORATORY DATA:  I have reviewed the data as listed  Lab Results  Component Value Date   WBC 2.5 (L) 12/19/2023   NEUTROABS 1.7 12/19/2023   HGB 11.9 (L) 12/19/2023   HCT 34.4 (L) 12/19/2023   MCV 95.8 12/19/2023   PLT 181 12/19/2023      Chemistry      Component Value Date/Time   NA 138 12/19/2023 0948   NA 145 (H) 05/22/2023 1054   K 3.8 12/19/2023 0948   CL 105 12/19/2023 0948   CO2 25 12/19/2023 0948   BUN 9 12/19/2023 0948   BUN 7 05/22/2023 1054   CREATININE 0.45 12/19/2023  0948   GLU 83 08/25/2015 0000      Component Value Date/Time   CALCIUM 8.9 12/19/2023 0948   ALKPHOS 51 12/19/2023 0948   AST 14 (L) 12/19/2023 0948   ALT 19 12/19/2023 0948   BILITOT 0.6 12/19/2023 4098  BILITOT 0.5 05/22/2023 1054       RADIOGRAPHIC STUDIES: None to review

## 2023-12-20 DIAGNOSIS — Z3202 Encounter for pregnancy test, result negative: Secondary | ICD-10-CM | POA: Diagnosis not present

## 2023-12-20 DIAGNOSIS — Z825 Family history of asthma and other chronic lower respiratory diseases: Secondary | ICD-10-CM | POA: Diagnosis not present

## 2023-12-20 DIAGNOSIS — F4310 Post-traumatic stress disorder, unspecified: Secondary | ICD-10-CM | POA: Diagnosis not present

## 2023-12-20 DIAGNOSIS — J439 Emphysema, unspecified: Secondary | ICD-10-CM | POA: Diagnosis not present

## 2023-12-20 DIAGNOSIS — I7300 Raynaud's syndrome without gangrene: Secondary | ICD-10-CM | POA: Diagnosis not present

## 2023-12-20 DIAGNOSIS — F419 Anxiety disorder, unspecified: Secondary | ICD-10-CM | POA: Diagnosis not present

## 2023-12-20 DIAGNOSIS — G936 Cerebral edema: Secondary | ICD-10-CM | POA: Diagnosis not present

## 2023-12-20 DIAGNOSIS — J45909 Unspecified asthma, uncomplicated: Secondary | ICD-10-CM | POA: Diagnosis not present

## 2023-12-20 DIAGNOSIS — K219 Gastro-esophageal reflux disease without esophagitis: Secondary | ICD-10-CM | POA: Diagnosis not present

## 2023-12-20 DIAGNOSIS — C3411 Malignant neoplasm of upper lobe, right bronchus or lung: Secondary | ICD-10-CM | POA: Diagnosis not present

## 2023-12-20 DIAGNOSIS — F32A Depression, unspecified: Secondary | ICD-10-CM | POA: Diagnosis not present

## 2023-12-20 DIAGNOSIS — R220 Localized swelling, mass and lump, head: Secondary | ICD-10-CM | POA: Diagnosis not present

## 2023-12-20 DIAGNOSIS — Z87891 Personal history of nicotine dependence: Secondary | ICD-10-CM | POA: Diagnosis not present

## 2023-12-21 ENCOUNTER — Other Ambulatory Visit: Payer: Self-pay | Admitting: Oncology

## 2023-12-21 ENCOUNTER — Inpatient Hospital Stay: Admitting: Dietician

## 2023-12-21 ENCOUNTER — Telehealth: Payer: Self-pay | Admitting: Dietician

## 2023-12-21 DIAGNOSIS — F32A Depression, unspecified: Secondary | ICD-10-CM | POA: Diagnosis not present

## 2023-12-21 DIAGNOSIS — F419 Anxiety disorder, unspecified: Secondary | ICD-10-CM | POA: Diagnosis not present

## 2023-12-21 DIAGNOSIS — K219 Gastro-esophageal reflux disease without esophagitis: Secondary | ICD-10-CM | POA: Diagnosis not present

## 2023-12-21 DIAGNOSIS — Z825 Family history of asthma and other chronic lower respiratory diseases: Secondary | ICD-10-CM | POA: Diagnosis not present

## 2023-12-21 DIAGNOSIS — Z87891 Personal history of nicotine dependence: Secondary | ICD-10-CM | POA: Diagnosis not present

## 2023-12-21 DIAGNOSIS — F4310 Post-traumatic stress disorder, unspecified: Secondary | ICD-10-CM | POA: Diagnosis not present

## 2023-12-21 DIAGNOSIS — I7300 Raynaud's syndrome without gangrene: Secondary | ICD-10-CM | POA: Diagnosis not present

## 2023-12-21 DIAGNOSIS — Z3202 Encounter for pregnancy test, result negative: Secondary | ICD-10-CM | POA: Diagnosis not present

## 2023-12-21 DIAGNOSIS — J45909 Unspecified asthma, uncomplicated: Secondary | ICD-10-CM | POA: Diagnosis not present

## 2023-12-21 DIAGNOSIS — J439 Emphysema, unspecified: Secondary | ICD-10-CM | POA: Diagnosis not present

## 2023-12-21 DIAGNOSIS — G936 Cerebral edema: Secondary | ICD-10-CM | POA: Diagnosis not present

## 2023-12-21 DIAGNOSIS — C3411 Malignant neoplasm of upper lobe, right bronchus or lung: Secondary | ICD-10-CM | POA: Diagnosis not present

## 2023-12-21 DIAGNOSIS — R220 Localized swelling, mass and lump, head: Secondary | ICD-10-CM | POA: Diagnosis not present

## 2023-12-21 MED ORDER — SUCRALFATE 1 GM/10ML PO SUSP: 1.0000 g | Freq: Three times a day (TID) | ORAL | 2 refills | Status: DC

## 2023-12-21 NOTE — Telephone Encounter (Signed)
 Nutrition Follow-up:  Pt with lung cancer metastatic to brain. S/p cerebellar mets resection (3/19 at [facility]). She is concurrent chemoRT to lung with weekly paclitaxel /carboplatin . Patient is under the care of Dr. Orvis Blare.   Spoke with patient via telephone. 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 Pt is using lidocaine  solution, however this is not providing much benefit. Says pills are difficult to swallow. She is tolerating softer textures fairly well. Patient endorses decreased appetite secondary to this. Patient reports nausea that started yesterday afternoon. This has a "hold" on her this morning. Patient reports taking antiemetics as prescribed. Zofran  odt is working well for her. Patient denies episodes of vomiting. Patient continues to drink lots of water. She reports occasional water retention which is present today. She does not take fluid pill. MD made aware. Patient denies constipation or diarrhea. Reports no headaches in the last week.    Medications: reviewed   Labs: reviewed   Anthropometrics: Wt 143 lb 8 oz on 5/27  5/20 - 140 lb 3.4 oz 5/5 - 139 lb 15.9 oz  4/10 - 134 lb 12.8 oz    NUTRITION DIAGNOSIS: Food and nutrition related knowledge deficit improving    INTERVENTION:  Encourage soft moist foods high in calories and protein - suggested adding gravies/sauces to foods  Suggested trying applesauce with po medications for ease of swallow Discussed esophagitis with provider - Dr. Orvis Blare to call in carafate Continue antiemetics for nausea Encourage daily Ensure Complete given decreased appetite and odynophagia - will provide samples + coupons at next [facility] visit on 6/2    MONITORING, EVALUATION, GOAL: wt trends, intake   NEXT VISIT: Monday June 9 during infusion

## 2023-12-22 ENCOUNTER — Telehealth: Payer: Self-pay

## 2023-12-22 DIAGNOSIS — J45909 Unspecified asthma, uncomplicated: Secondary | ICD-10-CM | POA: Diagnosis not present

## 2023-12-22 DIAGNOSIS — K219 Gastro-esophageal reflux disease without esophagitis: Secondary | ICD-10-CM | POA: Diagnosis not present

## 2023-12-22 DIAGNOSIS — Z3202 Encounter for pregnancy test, result negative: Secondary | ICD-10-CM | POA: Diagnosis not present

## 2023-12-22 DIAGNOSIS — I7300 Raynaud's syndrome without gangrene: Secondary | ICD-10-CM | POA: Diagnosis not present

## 2023-12-22 DIAGNOSIS — Z825 Family history of asthma and other chronic lower respiratory diseases: Secondary | ICD-10-CM | POA: Diagnosis not present

## 2023-12-22 DIAGNOSIS — F4310 Post-traumatic stress disorder, unspecified: Secondary | ICD-10-CM | POA: Diagnosis not present

## 2023-12-22 DIAGNOSIS — R220 Localized swelling, mass and lump, head: Secondary | ICD-10-CM | POA: Diagnosis not present

## 2023-12-22 DIAGNOSIS — F32A Depression, unspecified: Secondary | ICD-10-CM | POA: Diagnosis not present

## 2023-12-22 DIAGNOSIS — F419 Anxiety disorder, unspecified: Secondary | ICD-10-CM | POA: Diagnosis not present

## 2023-12-22 DIAGNOSIS — C3411 Malignant neoplasm of upper lobe, right bronchus or lung: Secondary | ICD-10-CM | POA: Diagnosis not present

## 2023-12-22 DIAGNOSIS — J439 Emphysema, unspecified: Secondary | ICD-10-CM | POA: Diagnosis not present

## 2023-12-22 DIAGNOSIS — G936 Cerebral edema: Secondary | ICD-10-CM | POA: Diagnosis not present

## 2023-12-22 DIAGNOSIS — Z87891 Personal history of nicotine dependence: Secondary | ICD-10-CM | POA: Diagnosis not present

## 2023-12-22 NOTE — Telephone Encounter (Signed)
 Notified Patient of prior authorization denial for Sucralfate 1gram/10ml Suspension. Advised that appeal had been filed but that it could take up to 30 days. Informed patient that tablets could be dissolved. Patient stated that she would see how much the medicine would cost and request the tablets if she could not afford the suspension. No other needs or concerns noted at this time.

## 2023-12-23 ENCOUNTER — Other Ambulatory Visit: Payer: Self-pay | Admitting: Oncology

## 2023-12-25 ENCOUNTER — Inpatient Hospital Stay

## 2023-12-25 ENCOUNTER — Inpatient Hospital Stay: Attending: Hematology

## 2023-12-25 ENCOUNTER — Inpatient Hospital Stay (HOSPITAL_BASED_OUTPATIENT_CLINIC_OR_DEPARTMENT_OTHER): Admitting: Oncology

## 2023-12-25 ENCOUNTER — Encounter: Payer: Self-pay | Admitting: Oncology

## 2023-12-25 VITALS — BP 104/68 | HR 89 | Temp 98.6°F | Resp 18

## 2023-12-25 DIAGNOSIS — F17210 Nicotine dependence, cigarettes, uncomplicated: Secondary | ICD-10-CM | POA: Insufficient documentation

## 2023-12-25 DIAGNOSIS — G939 Disorder of brain, unspecified: Secondary | ICD-10-CM | POA: Diagnosis not present

## 2023-12-25 DIAGNOSIS — C7801 Secondary malignant neoplasm of right lung: Secondary | ICD-10-CM | POA: Diagnosis not present

## 2023-12-25 DIAGNOSIS — C7931 Secondary malignant neoplasm of brain: Secondary | ICD-10-CM | POA: Diagnosis not present

## 2023-12-25 DIAGNOSIS — Z79899 Other long term (current) drug therapy: Secondary | ICD-10-CM | POA: Diagnosis not present

## 2023-12-25 DIAGNOSIS — C3411 Malignant neoplasm of upper lobe, right bronchus or lung: Secondary | ICD-10-CM | POA: Insufficient documentation

## 2023-12-25 DIAGNOSIS — F17200 Nicotine dependence, unspecified, uncomplicated: Secondary | ICD-10-CM

## 2023-12-25 DIAGNOSIS — Z5111 Encounter for antineoplastic chemotherapy: Secondary | ICD-10-CM | POA: Insufficient documentation

## 2023-12-25 DIAGNOSIS — L905 Scar conditions and fibrosis of skin: Secondary | ICD-10-CM | POA: Diagnosis not present

## 2023-12-25 DIAGNOSIS — D72810 Lymphocytopenia: Secondary | ICD-10-CM

## 2023-12-25 DIAGNOSIS — R110 Nausea: Secondary | ICD-10-CM

## 2023-12-25 DIAGNOSIS — R1310 Dysphagia, unspecified: Secondary | ICD-10-CM | POA: Insufficient documentation

## 2023-12-25 LAB — CBC WITH DIFFERENTIAL/PLATELET
Abs Immature Granulocytes: 0.02 10*3/uL (ref 0.00–0.07)
Basophils Absolute: 0 10*3/uL (ref 0.0–0.1)
Basophils Relative: 1 %
Eosinophils Absolute: 0 10*3/uL (ref 0.0–0.5)
Eosinophils Relative: 1 %
HCT: 34.8 % — ABNORMAL LOW (ref 36.0–46.0)
Hemoglobin: 12.4 g/dL (ref 12.0–15.0)
Immature Granulocytes: 1 %
Lymphocytes Relative: 16 %
Lymphs Abs: 0.4 10*3/uL — ABNORMAL LOW (ref 0.7–4.0)
MCH: 34.1 pg — ABNORMAL HIGH (ref 26.0–34.0)
MCHC: 35.6 g/dL (ref 30.0–36.0)
MCV: 95.6 fL (ref 80.0–100.0)
Monocytes Absolute: 0.3 10*3/uL (ref 0.1–1.0)
Monocytes Relative: 11 %
Neutro Abs: 1.7 10*3/uL (ref 1.7–7.7)
Neutrophils Relative %: 70 %
Platelets: 149 10*3/uL — ABNORMAL LOW (ref 150–400)
RBC: 3.64 MIL/uL — ABNORMAL LOW (ref 3.87–5.11)
RDW: 12.8 % (ref 11.5–15.5)
WBC: 2.4 10*3/uL — ABNORMAL LOW (ref 4.0–10.5)
nRBC: 0 % (ref 0.0–0.2)

## 2023-12-25 LAB — COMPREHENSIVE METABOLIC PANEL WITH GFR
ALT: 16 U/L (ref 0–44)
AST: 14 U/L — ABNORMAL LOW (ref 15–41)
Albumin: 3.6 g/dL (ref 3.5–5.0)
Alkaline Phosphatase: 51 U/L (ref 38–126)
Anion gap: 8 (ref 5–15)
BUN: 10 mg/dL (ref 6–20)
CO2: 24 mmol/L (ref 22–32)
Calcium: 9.1 mg/dL (ref 8.9–10.3)
Chloride: 105 mmol/L (ref 98–111)
Creatinine, Ser: 0.5 mg/dL (ref 0.44–1.00)
GFR, Estimated: >60 mL/min (ref >60)
Glucose, Bld: 90 mg/dL (ref 70–99)
Potassium: 3.7 mmol/L (ref 3.5–5.1)
Sodium: 137 mmol/L (ref 135–145)
Total Bilirubin: 0.7 mg/dL (ref 0.0–1.2)
Total Protein: 6.4 g/dL — ABNORMAL LOW (ref 6.5–8.1)

## 2023-12-25 LAB — MAGNESIUM: Magnesium: 2 mg/dL (ref 1.7–2.4)

## 2023-12-25 MED ORDER — SODIUM CHLORIDE 0.9 % IV SOLN: INTRAVENOUS | Status: DC

## 2023-12-25 MED ORDER — DEXAMETHASONE SODIUM PHOSPHATE 10 MG/ML IJ SOLN
10.0000 mg | Freq: Once | INTRAMUSCULAR | Status: AC
  Administered 2023-12-25: 10 mg via INTRAVENOUS
  Filled 2023-12-25: qty 1

## 2023-12-25 MED ORDER — HEPARIN SOD (PORK) LOCK FLUSH 100 UNIT/ML IV SOLN
500.0000 [IU] | Freq: Once | INTRAVENOUS | Status: AC | PRN
  Administered 2023-12-25: 500 [IU]

## 2023-12-25 MED ORDER — SODIUM CHLORIDE 0.9 % IV SOLN
275.0000 mg | Freq: Once | INTRAVENOUS | Status: AC
  Administered 2023-12-25: 280 mg via INTRAVENOUS
  Filled 2023-12-25: qty 28

## 2023-12-25 MED ORDER — SODIUM CHLORIDE 0.9% FLUSH
10.0000 mL | Freq: Once | INTRAVENOUS | Status: AC
  Administered 2023-12-25: 10 mL via INTRAVENOUS

## 2023-12-25 MED ORDER — SODIUM CHLORIDE 0.9% FLUSH
10.0000 mL | INTRAVENOUS | Status: DC | PRN
  Administered 2023-12-25: 10 mL

## 2023-12-25 MED ORDER — FAMOTIDINE IN NACL 20-0.9 MG/50ML-% IV SOLN
20.0000 mg | Freq: Once | INTRAVENOUS | Status: AC
  Administered 2023-12-25: 20 mg via INTRAVENOUS
  Filled 2023-12-25: qty 50

## 2023-12-25 MED ORDER — CETIRIZINE HCL 10 MG/ML IV SOLN
10.0000 mg | Freq: Once | INTRAVENOUS | Status: AC
  Administered 2023-12-25: 10 mg via INTRAVENOUS
  Filled 2023-12-25: qty 1

## 2023-12-25 MED ORDER — PALONOSETRON HCL INJECTION 0.25 MG/5ML
0.2500 mg | Freq: Once | INTRAVENOUS | Status: AC
  Administered 2023-12-25: 0.25 mg via INTRAVENOUS
  Filled 2023-12-25: qty 5

## 2023-12-25 MED ORDER — SODIUM CHLORIDE 0.9 % IV SOLN
45.0000 mg/m2 | Freq: Once | INTRAVENOUS | Status: AC
  Administered 2023-12-25: 72 mg via INTRAVENOUS
  Filled 2023-12-25: qty 12

## 2023-12-25 NOTE — Assessment & Plan Note (Signed)
 Patient has chemotherapy-induced nausea.  Now better controlled with Compazine   - Continue prescribed antinausea medications

## 2023-12-25 NOTE — Assessment & Plan Note (Signed)
 Patient has leukopenia without neutropenia at this time. Likely chemotherapy and radiation therapy-induced  - Continue to monitor for now

## 2023-12-25 NOTE — Assessment & Plan Note (Signed)
 Scar from port placement is irritated and tender.  No signs of infection.  Improved with silicone patches  - Continue using silicone patches to the scar. - Use Tylenol  for pain management as needed.

## 2023-12-25 NOTE — Progress Notes (Signed)
 Patient Care Team: Roise Cleaver, MD as PCP - General (Family Medicine) [name], MD as Consulting Physician (Rheumatology) [name], MD as Medical Oncologist (Medical Oncology) Gerhard Knuckles, RN as Oncology Nurse Navigator (Medical Oncology) Marc Senior, MD as Consulting Physician (Pulmonary Disease)  Clinic Day:  12/25/2023  Referring physician: Roise Cleaver, MD   CHIEF COMPLAINT:  CC: Metastatic lung adenocarcinoma   ASSESSMENT & PLAN:   Assessment & Plan: Julie Horn  is a 48 y.o. 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   S/p cerebellar lesion resection consistent with lung primary.   Oncology history as below. PET scan showed limited disease to lung and lymph nodes.  Discussed at tumor board, surgery is not a feasible option secondary to lymph node involvement.  Recommended chemoradiation. Caris: PD-L1: 100%  -Started thoracic radiation. C5D1 today.  Reports some nausea manageable with Compazine .  Labs and physical exam stable today and will proceed with chemotherapy.  Reemphasized common side effects including immunosuppression, nausea, vomiting. -Has some leukopenia on labs today.  Normal ANC.  Continue with chemo RT -For nausea please take Compazine  as prescribed.  Zofran  can be taken after day 3.  Will hold off on steroids at this time -Discussed that we will obtain a CT scan to assess response 4 weeks after completing chemo RT and if stable or response is better we will do immunotherapy for 1 year with durvalumab -Will coordinate care and imaging with Dr. Cipriano Creeks, radiation oncologist.  Return to clinic in 1 week before sixth cycle of chemotherapy.  That will likely be her last dose of chemotherapy considering last dose of radiation is on 01/09/2024.   Metastasis to brain [facility]) Cerebellar  lesion s/p resection consistent with the poorly differentiated adenocarcinoma of lung primary. Patient completed gamma knife at [facility].  Currently on a clinical trial assessing gamma knife versus gamma tile Completed steroid taper.  - Continue to follow with [facility]. - MRI protocol per the study - Patient reports headaches that are only manageable with Fioricet.  Recommended to follow-up regarding headaches with [facility] team.  Patient reached out to the team and is awaiting on reply.  Scar pain Scar from port placement is irritated and tender.  No signs of infection.  Improved with silicone patches  - Continue using silicone patches to the scar. - Use Tylenol  for pain management as needed.  Nicotine dependence with current use Patient quit smoking since a diagnosis of lung cancer  - Encouraged to abstain from smoking  Nausea without vomiting Patient has chemotherapy-induced nausea.  Now better controlled with Compazine   - Continue prescribed antinausea medications  Leukopenia Patient has leukopenia without neutropenia at this time. 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  She knows to contact our office if she develops concerns prior to her next appointment.  I provided 20 minutes of face-to-face time during this encounter and > 50% was spent counseling as documented under my assessment and plan.    [name], MD  [facility] [place] - A DEPT OF [facility] [address] [place] [address] Dept: [phone] Dept Fax: [phone]  ONCOLOGY HISTORY:   Oncology History  Metastatic lung carcinoma, right (HCC)  09/25/2023 Imaging   CT CAP w contrast:  1. Spiculated mass  in the posterior right pulmonary apex measuring 2.6 x 1.4 cm, consistent with primary bronchogenic malignancy. 2. Enlarged right hilar lymph node measuring 1.5 x 1.2 cm, consistent with nodal metastatic disease. 3. Tiny hypodensities in the central liver, incompletely characterized, although statistically likely benign cysts or hemangiomas. Small metastases not strictly excluded. 4. Soft tissue nodule within the right gluteal subcutaneous fat;given location most likely an injection granuloma, however subcutaneous soft tissue metastases are occasionally seen in metastatic lung malignancy. Attention on follow-up.     09/25/2023 Imaging   MRI brain w wo contrast:  1. Heterogeneously contrast-enhancing mass of the right cerebellar hemisphere with a large amount of surrounding vasogenic edema. Metastasis and hemangioblastoma are the most common infratentorial tumors in adults. Astrocytoma is another possibility. 2. Mass effect on the fourth ventricle without hydrocephalus.   09/27/2023 Initial Diagnosis   Metastatic lung carcinoma, right (HCC)   10/05/2023 PET scan   IMPRESSION: 1. Hypermetabolic RIGHT upper lobe lung nodule consistent primary bronchogenic carcinoma. 2. Mildly hypermetabolic RIGHT lower paratracheal and RIGHT hilar lymph nodes are concerning for nodal metastasis. No contralateral hypermetabolic lymph nodes. 3. No evidence of distant metastatic disease. 4. Intense activity through the distal anal canal is favored physiologic. Recommend digital rectal exam.   10/11/2023 Procedure   Resection of cerebellar metastasis   10/11/2023 Pathology Results   Diagnosis   A: Brain tumor, biopsy - Metastatic poorly differentiated adenocarcinoma consistent with lung primary.   B: Brain tumor, right, resection - Metastatic poorly differentiated adenocarcinoma consistent with lung primary.   C: Brain tumor, right, specimen trap - Metastatic poorly differentiated adenocarcinoma consistent with  lung primary.     10/26/2023 Imaging   MRI brain:  Status post right occipital craniotomy for mass resection. Marked reduction in edema and mass effect, with a now normal appearance of the fourth ventricle. Blood products are present in the region of the approach and resection, with a small amount of adjacent enhancement as would be expected at this time. The study is indeterminate for residual marginal tumor and additional follow-up is warranted.   10/27/2023 Pathology Results   Caris:  PD-L1: Positive, TPS: 100%, 3+ TMB: High, 15 mutation per MB  ALK, EGFR, KRAS, MET, RET, ROS1: Negative   11/03/2023 Procedure   Port placement   11/12/2023 - 11/17/2023 Radiation Therapy   5 fractions of SBRT for R cerebellar bed   11/27/2023 -  Chemotherapy   Patient is on Treatment Plan : LUNG Carboplatin  + Paclitaxel  + XRT q7d         Current Treatment:  Chemo RT  INTERVAL HISTORY:  Julie Horn is here today for follow up.  She experiences difficulty swallowing, describing it as a sensation of food and pills scratching her throat, particularly in the morning. She feels as though the pills 'sit there' in her throat, although she acknowledges this may not be the case.  She stated that energy wise she is feeling better this week.   No new complaints aside from fatigue and queasiness. Improvement in nausea and headaches, and no current pain.  I have reviewed the past medical history, past surgical history, social history and family history with the patient and they are unchanged from previous note.  ALLERGIES:  is allergic to levofloxacin, sulfa antibiotics, azithromycin, doxycycline , latex, and wound dressing adhesive.  MEDICATIONS:  Current Outpatient Medications  Medication Sig Dispense Refill   acetaminophen  (TYLENOL ) 500 MG tablet Take 1,000 mg by mouth every 6 (six) hours as needed for mild pain (pain score 1-3). Take 2 tablets (1,000 mg total) by mouth every six (6) hours.      albuterol  (VENTOLIN  HFA) 108 (90 Base) MCG/ACT inhaler Inhale 2 puffs into the lungs every 4 (four) hours as needed for wheezing or shortness of breath. 6.7 g 2   ascorbic acid (VITAMIN C) 500 MG tablet Take 500 mg by mouth daily. Take 1 tablet (500 mg total) by mouth daily     budesonide -formoterol  (SYMBICORT ) 160-4.5 MCG/ACT inhaler Inhale 2 puffs into the lungs 2 (two) times daily. Rinse mouth well after use. 1 each 11   butalbital -acetaminophen -caffeine  (FIORICET) 50-325-40 MG tablet Take 1 tablet by mouth every 4 (four) hours as needed for headache. 14 tablet 1   CARBOPLATIN  IV Inject into the vein.     co-enzyme Q-10 30 MG capsule Take 30 mg by mouth 3 (three) times daily. Take 1 capsule (30 mg total) by mouth Three (3) times a day.     Cyanocobalamin (VITAMIN B 12) 500 MCG TABS Take 500 mg by mouth daily.     dexamethasone  (DECADRON ) 4 MG tablet Take 2 tablets daily for 2 days, start the day after chemotherapy. Take with food. 30 tablet 1   docusate sodium (COLACE) 100 MG capsule Take 100 mg by mouth 3 (three) times daily as needed for mild constipation. Take 1 capsule (100 mg total) by mouth Three (3) times a day as needed for constipation.     famotidine  (PEPCID ) 20 MG tablet Take 20 mg by mouth daily.     folic acid (FOLVITE) 400 MCG tablet Take 400 mcg by mouth daily.     guaiFENesin  (MUCINEX ) 600 MG 12 hr tablet Take 1,200 mg by mouth 2 (two) times daily. Take 1 tablet (1200 mg total) by mouth every twelve (12) hours.     hydrOXYzine  (VISTARIL ) 25 MG capsule Take 1 capsule (25 mg total) by mouth 3 (three) times daily. For anxiety and sleep. 90 capsule 5   ibuprofen (ADVIL) 100 MG tablet Take 200-400 mg by mouth every 6 (six) hours as needed for pain.     lidocaine  (XYLOCAINE ) 2 % solution Take 10 mLs by mouth.     lidocaine -prilocaine  (EMLA ) cream Apply to affected area once 30 g 3   loratadine (CLARITIN) 10 MG tablet Take 10 mg by mouth daily.     magnesium oxide (MAG-OX) 400 (240 Mg)  MG tablet Take 400 mg by mouth daily. Take 1 tablet (400 mg total) by mouth daily.     melatonin 1 MG TABS tablet Take 1 mg by mouth at bedtime. Take 5 tablets (5 mg total) by mouth nightly.     montelukast  (SINGULAIR ) 10 MG tablet Take 1 tablet (10 mg total) by mouth at bedtime. (Patient taking differently: Take 10 mg by mouth daily as needed (allergies).) 90 tablet 3   omeprazole (PRILOSEC) 20 MG capsule Take 20 mg by mouth daily.     ondansetron  (ZOFRAN ) 8 MG tablet Take 1 tablet (8 mg total) by mouth every 8 (eight) hours as needed for nausea or vomiting. Start on the third day after chemotherapy. 30 tablet 1   ondansetron  (ZOFRAN -ODT) 4 MG disintegrating tablet Take 1 tablet (4 mg total) by mouth every 8 (eight) hours as needed for nausea or vomiting. 30 tablet 5   PACLitaxel  (TAXOL  IV) Inject into the vein.  Palonosetron  HCl (ALOXI  IV) Inject into the vein.     prochlorperazine  (COMPAZINE ) 10 MG tablet Take 1 tablet (10 mg total) by mouth every 6 (six) hours as needed for nausea or vomiting. 30 tablet 1   sennosides-docusate sodium (SENOKOT-S) 8.6-50 MG tablet Take 2 tablets by mouth daily.     sucralfate  (CARAFATE ) 1 GM/10ML suspension TAKE 10 MLS (1 G TOTAL) BY MOUTH 4 (FOUR) TIMES DAILY - WITH MEALS AND AT BEDTIME. 420 mL 2   traZODone  (DESYREL ) 150 MG tablet USE FROM 1/3 TO 1 TABLET NIGHTLY AS NEEDED FOR SLEEP. 90 tablet 1   No current facility-administered medications for this visit.    REVIEW OF SYSTEMS:   Constitutional: Denies fevers, chills or abnormal weight loss Eyes: Denies blurriness of vision Ears, nose, mouth, throat, and face: Denies mucositis or sore throat Respiratory: Denies cough, dyspnea or wheezes Cardiovascular: Denies palpitation, chest discomfort or lower extremity swelling Gastrointestinal:  Denies nausea, heartburn or change in bowel habits Skin: Denies abnormal skin rashes Lymphatics: Denies new lymphadenopathy or easy bruising Neurological:Denies  numbness, tingling or new weaknesses Behavioral/Psych: Mood is stable, no new changes  All other systems were reviewed with the patient and are negative.   VITALS:  Last menstrual period 07/26/2019.  Wt Readings from Last 3 Encounters:  12/25/23 141 lb 12.1 oz (64.3 kg)  12/19/23 143 lb 8 oz (65.1 kg)  12/12/23 140 lb 3.4 oz (63.6 kg)    There is no height or weight on file to calculate BMI.  Performance status (ECOG): 0 - Asymptomatic  PHYSICAL EXAM:   GENERAL:alert, no distress and comfortable SKIN: Port insertion surgical site tender, erythematous.  No pus. LUNGS: clear to auscultation and percussion with normal breathing effort HEART: regular rate & rhythm and no murmurs and no lower extremity edema ABDOMEN:abdomen soft, non-tender and normal bowel sounds Musculoskeletal:no cyanosis of digits and no clubbing  NEURO: alert & oriented x 3 with fluent speech.  Postsurgical scar on the right occipital region.  LABORATORY DATA:  I have reviewed the data as listed  Lab Results  Component Value Date   WBC 2.4 (L) 12/25/2023   NEUTROABS 1.7 12/25/2023   HGB 12.4 12/25/2023   HCT 34.8 (L) 12/25/2023   MCV 95.6 12/25/2023   PLT 149 (L) 12/25/2023      Chemistry      Component Value Date/Time   NA 137 12/25/2023 0840   NA 145 (H) 05/22/2023 1054   K 3.7 12/25/2023 0840   CL 105 12/25/2023 0840   CO2 24 12/25/2023 0840   BUN 10 12/25/2023 0840   BUN 7 05/22/2023 1054   CREATININE 0.50 12/25/2023 0840   GLU 83 08/25/2015 0000      Component Value Date/Time   CALCIUM PENDING 12/25/2023 0840   ALKPHOS 51 12/25/2023 0840   AST 14 (L) 12/25/2023 0840   ALT 16 12/25/2023 0840   BILITOT 0.7 12/25/2023 0840   BILITOT 0.5 05/22/2023 1054       RADIOGRAPHIC STUDIES:  None to review

## 2023-12-25 NOTE — Assessment & Plan Note (Signed)
 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

## 2023-12-25 NOTE — Assessment & Plan Note (Signed)
 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   S/p cerebellar lesion resection consistent with lung primary.   Oncology history as below. PET scan showed limited disease to lung and lymph nodes.  Discussed at tumor board, surgery is not a feasible option secondary to lymph node involvement.  Recommended chemoradiation. Caris: PD-L1: 100%  -Started thoracic radiation. C5D1 today.  Reports some nausea manageable with Compazine .  Labs and physical exam stable today and will proceed with chemotherapy.  Reemphasized common side effects including immunosuppression, nausea, vomiting. -Has some leukopenia on labs today.  Normal ANC.  Continue with chemo RT -For nausea please take Compazine  as prescribed.  Zofran  can be taken after day 3.  Will hold off on steroids at this time -Discussed that we will obtain a CT scan to assess response 4 weeks after completing chemo RT and if stable or response is better we will do immunotherapy for 1 year with durvalumab -Will coordinate care and imaging with Dr. Cipriano Creeks, radiation oncologist.  Return to clinic in 1 week before sixth cycle of chemotherapy.  That will likely be her last dose of chemotherapy considering last dose of radiation is on 01/09/2024.

## 2023-12-25 NOTE — Assessment & Plan Note (Signed)
 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 Patient completed gamma knife at [facility].  Currently on a clinical trial assessing gamma knife versus gamma tile Completed steroid taper.  - Continue to follow with [facility]. - MRI protocol per the study - Patient reports headaches that are only manageable with Fioricet.  Recommended to follow-up regarding headaches with [facility] team.  Patient reached out to the team and is awaiting on reply.

## 2023-12-25 NOTE — Assessment & Plan Note (Signed)
 Patient quit smoking since a diagnosis of lung cancer  - Encouraged to abstain from smoking

## 2023-12-25 NOTE — Patient Instructions (Signed)
 [facility] [place] - A DEPT OF [facility]  Discharge Instructions: Thank you for choosing [facility] to provide your oncology and hematology care.  If you have a lab appointment with the Cancer Center - please note that after April 8th, 2024, all labs will be drawn in the cancer center.  You do not have to check in or register with the main entrance as you have in the past but will complete your check-in in the cancer center.  Wear comfortable clothing and clothing appropriate for easy access to any Portacath or PICC line.   We strive to give you quality time with your provider. You may need to reschedule your appointment if you arrive late (15 or more minutes).  Arriving late affects you and other patients whose appointments are after yours.  Also, if you miss three or more appointments without notifying the office, you may be dismissed from the clinic at the provider's discretion.      For prescription refill requests, have your pharmacy contact our office and allow 72 hours for refills to be completed.    Today you received the following chemotherapy and/or immunotherapy agents taxol and carboplatin      To help prevent nausea and vomiting after your treatment, we encourage you to take your nausea medication as directed.  BELOW ARE SYMPTOMS THAT SHOULD BE REPORTED IMMEDIATELY: *FEVER GREATER THAN 100.4 F (38 C) OR HIGHER *CHILLS OR SWEATING *NAUSEA AND VOMITING THAT IS NOT CONTROLLED WITH YOUR NAUSEA MEDICATION *UNUSUAL SHORTNESS OF BREATH *UNUSUAL BRUISING OR BLEEDING *URINARY PROBLEMS (pain or burning when urinating, or frequent urination) *BOWEL PROBLEMS (unusual diarrhea, constipation, pain near the anus) TENDERNESS IN MOUTH AND THROAT WITH OR WITHOUT PRESENCE OF ULCERS (sore throat, sores in mouth, or a toothache) UNUSUAL RASH, SWELLING OR PAIN  UNUSUAL VAGINAL DISCHARGE OR ITCHING   Items with * indicate a potential emergency and should be  followed up as soon as possible or go to the Emergency Department if any problems should occur.  Please show the CHEMOTHERAPY ALERT CARD or IMMUNOTHERAPY ALERT CARD at check-in to the Emergency Department and triage nurse.  Should you have questions after your visit or need to cancel or reschedule your appointment, please contact [facility] [place] - A DEPT OF [facility] [phone]  and follow the prompts.  Office hours are 8:00 a.m. to 4:30 p.m. Monday - Friday. Please note that voicemails left after 4:00 p.m. may not be returned until the following business day.  We are closed weekends and major holidays. You have access to a nurse at all times for urgent questions. Please call the main number to the clinic [phone] and follow the prompts.  For any non-urgent questions, you may also contact your provider using MyChart. We now offer e-Visits for anyone 45 and older to request care online for non-urgent symptoms. For details visit [email or web address].   Also download the MyChart app! Go to the app store, search "MyChart", open the app, select [place], and log in with your MyChart username and password.

## 2023-12-25 NOTE — Telephone Encounter (Signed)
 Insurance is requesting an alternative.  Will not pay for Carafate .

## 2023-12-25 NOTE — Progress Notes (Signed)

## 2023-12-26 ENCOUNTER — Other Ambulatory Visit: Payer: Self-pay

## 2023-12-26 DIAGNOSIS — G939 Disorder of brain, unspecified: Secondary | ICD-10-CM | POA: Diagnosis not present

## 2023-12-26 DIAGNOSIS — C3411 Malignant neoplasm of upper lobe, right bronchus or lung: Secondary | ICD-10-CM | POA: Diagnosis not present

## 2023-12-27 DIAGNOSIS — C3411 Malignant neoplasm of upper lobe, right bronchus or lung: Secondary | ICD-10-CM | POA: Diagnosis not present

## 2023-12-27 DIAGNOSIS — G939 Disorder of brain, unspecified: Secondary | ICD-10-CM | POA: Diagnosis not present

## 2023-12-28 DIAGNOSIS — G939 Disorder of brain, unspecified: Secondary | ICD-10-CM | POA: Diagnosis not present

## 2023-12-28 DIAGNOSIS — C3411 Malignant neoplasm of upper lobe, right bronchus or lung: Secondary | ICD-10-CM | POA: Diagnosis not present

## 2023-12-29 ENCOUNTER — Encounter: Payer: Self-pay | Admitting: Oncology

## 2023-12-29 DIAGNOSIS — C3411 Malignant neoplasm of upper lobe, right bronchus or lung: Secondary | ICD-10-CM | POA: Diagnosis not present

## 2023-12-29 DIAGNOSIS — G939 Disorder of brain, unspecified: Secondary | ICD-10-CM | POA: Diagnosis not present

## 2023-12-31 ENCOUNTER — Other Ambulatory Visit: Payer: Self-pay

## 2024-01-01 ENCOUNTER — Other Ambulatory Visit: Payer: Self-pay | Admitting: *Deleted

## 2024-01-01 ENCOUNTER — Inpatient Hospital Stay: Admitting: Dietician

## 2024-01-01 ENCOUNTER — Inpatient Hospital Stay

## 2024-01-01 ENCOUNTER — Inpatient Hospital Stay (HOSPITAL_BASED_OUTPATIENT_CLINIC_OR_DEPARTMENT_OTHER): Admitting: Oncology

## 2024-01-01 VITALS — BP 118/79 | HR 89 | Temp 97.6°F | Resp 18

## 2024-01-01 DIAGNOSIS — C3411 Malignant neoplasm of upper lobe, right bronchus or lung: Secondary | ICD-10-CM | POA: Diagnosis not present

## 2024-01-01 DIAGNOSIS — F17200 Nicotine dependence, unspecified, uncomplicated: Secondary | ICD-10-CM | POA: Diagnosis not present

## 2024-01-01 DIAGNOSIS — R1310 Dysphagia, unspecified: Secondary | ICD-10-CM | POA: Diagnosis not present

## 2024-01-01 DIAGNOSIS — C7931 Secondary malignant neoplasm of brain: Secondary | ICD-10-CM | POA: Diagnosis not present

## 2024-01-01 DIAGNOSIS — L905 Scar conditions and fibrosis of skin: Secondary | ICD-10-CM

## 2024-01-01 DIAGNOSIS — F17210 Nicotine dependence, cigarettes, uncomplicated: Secondary | ICD-10-CM | POA: Diagnosis not present

## 2024-01-01 DIAGNOSIS — D72810 Lymphocytopenia: Secondary | ICD-10-CM

## 2024-01-01 DIAGNOSIS — C7801 Secondary malignant neoplasm of right lung: Secondary | ICD-10-CM

## 2024-01-01 DIAGNOSIS — R110 Nausea: Secondary | ICD-10-CM

## 2024-01-01 DIAGNOSIS — Z5111 Encounter for antineoplastic chemotherapy: Secondary | ICD-10-CM | POA: Diagnosis not present

## 2024-01-01 DIAGNOSIS — G939 Disorder of brain, unspecified: Secondary | ICD-10-CM | POA: Diagnosis not present

## 2024-01-01 DIAGNOSIS — Z79899 Other long term (current) drug therapy: Secondary | ICD-10-CM | POA: Diagnosis not present

## 2024-01-01 LAB — MAGNESIUM: Magnesium: 2.2 mg/dL (ref 1.7–2.4)

## 2024-01-01 LAB — CBC WITH DIFFERENTIAL/PLATELET
Abs Immature Granulocytes: 0 10*3/uL (ref 0.00–0.07)
Basophils Absolute: 0 10*3/uL (ref 0.0–0.1)
Basophils Relative: 1 %
Eosinophils Absolute: 0 10*3/uL (ref 0.0–0.5)
Eosinophils Relative: 2 %
HCT: 31.4 % — ABNORMAL LOW (ref 36.0–46.0)
Hemoglobin: 11.1 g/dL — ABNORMAL LOW (ref 12.0–15.0)
Immature Granulocytes: 0 %
Lymphocytes Relative: 20 %
Lymphs Abs: 0.3 10*3/uL — ABNORMAL LOW (ref 0.7–4.0)
MCH: 34.3 pg — ABNORMAL HIGH (ref 26.0–34.0)
MCHC: 35.4 g/dL (ref 30.0–36.0)
MCV: 96.9 fL (ref 80.0–100.0)
Monocytes Absolute: 0.3 10*3/uL (ref 0.1–1.0)
Monocytes Relative: 16 %
Neutro Abs: 1.1 10*3/uL — ABNORMAL LOW (ref 1.7–7.7)
Neutrophils Relative %: 61 %
Platelets: 142 10*3/uL — ABNORMAL LOW (ref 150–400)
RBC: 3.24 MIL/uL — ABNORMAL LOW (ref 3.87–5.11)
RDW: 13.2 % (ref 11.5–15.5)
WBC: 1.7 10*3/uL — ABNORMAL LOW (ref 4.0–10.5)
nRBC: 0 % (ref 0.0–0.2)

## 2024-01-01 LAB — COMPREHENSIVE METABOLIC PANEL WITH GFR
ALT: 15 U/L (ref 0–44)
AST: 12 U/L — ABNORMAL LOW (ref 15–41)
Albumin: 3.4 g/dL — ABNORMAL LOW (ref 3.5–5.0)
Alkaline Phosphatase: 44 U/L (ref 38–126)
Anion gap: 8 (ref 5–15)
BUN: 8 mg/dL (ref 6–20)
CO2: 25 mmol/L (ref 22–32)
Calcium: 8.9 mg/dL (ref 8.9–10.3)
Chloride: 106 mmol/L (ref 98–111)
Creatinine, Ser: 0.45 mg/dL (ref 0.44–1.00)
GFR, Estimated: >60 mL/min (ref >60)
Glucose, Bld: 91 mg/dL (ref 70–99)
Potassium: 3.7 mmol/L (ref 3.5–5.1)
Sodium: 139 mmol/L (ref 135–145)
Total Bilirubin: 0.7 mg/dL (ref 0.0–1.2)
Total Protein: 5.9 g/dL — ABNORMAL LOW (ref 6.5–8.1)

## 2024-01-01 MED ORDER — CETIRIZINE HCL 10 MG/ML IV SOLN
10.0000 mg | Freq: Once | INTRAVENOUS | Status: AC
  Administered 2024-01-01: 10 mg via INTRAVENOUS
  Filled 2024-01-01: qty 1

## 2024-01-01 MED ORDER — SODIUM CHLORIDE 0.9% FLUSH
10.0000 mL | INTRAVENOUS | Status: AC
  Administered 2024-01-01: 10 mL via INTRAVENOUS

## 2024-01-01 MED ORDER — SODIUM CHLORIDE 0.9 % IV SOLN: INTRAVENOUS | Status: DC

## 2024-01-01 MED ORDER — PALONOSETRON HCL INJECTION 0.25 MG/5ML
0.2500 mg | Freq: Once | INTRAVENOUS | Status: AC
  Administered 2024-01-01: 0.25 mg via INTRAVENOUS
  Filled 2024-01-01: qty 5

## 2024-01-01 MED ORDER — FAMOTIDINE IN NACL 20-0.9 MG/50ML-% IV SOLN
20.0000 mg | Freq: Once | INTRAVENOUS | Status: AC
  Administered 2024-01-01: 20 mg via INTRAVENOUS
  Filled 2024-01-01: qty 50

## 2024-01-01 MED ORDER — HEPARIN SOD (PORK) LOCK FLUSH 100 UNIT/ML IV SOLN
500.0000 [IU] | Freq: Once | INTRAVENOUS | Status: AC | PRN
  Administered 2024-01-01: 500 [IU]

## 2024-01-01 MED ORDER — DEXAMETHASONE SODIUM PHOSPHATE 10 MG/ML IJ SOLN
10.0000 mg | Freq: Once | INTRAMUSCULAR | Status: AC
  Administered 2024-01-01: 10 mg via INTRAVENOUS
  Filled 2024-01-01: qty 1

## 2024-01-01 MED ORDER — SODIUM CHLORIDE 0.9 % IV SOLN
45.0000 mg/m2 | Freq: Once | INTRAVENOUS | Status: AC
  Administered 2024-01-01: 72 mg via INTRAVENOUS
  Filled 2024-01-01: qty 12

## 2024-01-01 MED ORDER — SODIUM CHLORIDE 0.9 % IV SOLN
275.0000 mg | Freq: Once | INTRAVENOUS | Status: AC
  Administered 2024-01-01: 280 mg via INTRAVENOUS
  Filled 2024-01-01: qty 28

## 2024-01-01 MED ORDER — SODIUM CHLORIDE 0.9% FLUSH
10.0000 mL | INTRAVENOUS | Status: DC | PRN
  Administered 2024-01-01: 10 mL

## 2024-01-01 MED ORDER — SUCRALFATE 1 G PO TABS: 1.0000 g | ORAL_TABLET | Freq: Four times a day (QID) | ORAL | 0 refills | Status: DC

## 2024-01-01 NOTE — Progress Notes (Signed)
 Patient presents today for D1C6 Taxol , Carboplatin  and follow up visit with Dr. Orvis Blare. Vital signs within parameters for treatment. Labs pending.   ANC 1.1. Message received from Dr. Orvis Blare to proceed with treatment. Labs reviewed by MD. ANC > 1000 okay to treat.   Taxol  and carboplatin  given today per MD orders. Tolerated infusion without adverse affects. Vital signs stable. No complaints at this time. Discharged from clinic ambulatory in stable condition. Alert and oriented x 3. F/U with [facility] as scheduled.

## 2024-01-01 NOTE — Assessment & Plan Note (Signed)
 Scar from port placement is irritated and tender.  No signs of infection.  Improved with silicone patches  - Continue using silicone patches to the scar. - Use Tylenol  for pain management as needed.

## 2024-01-01 NOTE — Assessment & Plan Note (Signed)
 Patient quit smoking since a diagnosis of lung cancer  - Encouraged to abstain from smoking

## 2024-01-01 NOTE — Assessment & Plan Note (Signed)
 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 Patient completed gamma knife at [facility].  Currently on a clinical trial assessing gamma knife versus gamma tile Completed steroid taper.  - Continue to follow with [facility]. - MRI protocol per the study -Patient reports headaches that are only manageable with Fioricet.  Recommended to follow-up regarding headaches with [facility] team.

## 2024-01-01 NOTE — Patient Instructions (Addendum)
 [facility] - A DEPT OF [facility]  Discharge Instructions: Thank you for choosing [facility] to provide your oncology and hematology care.  If you have a lab appointment with the Cancer Center - please note that after April 8th, 2024, all labs will be drawn in the cancer center.  You do not have to check in or register with the main entrance as you have in the past but will complete your check-in in the cancer center.  Wear comfortable clothing and clothing appropriate for easy access to any Portacath or PICC line.   We strive to give you quality time with your provider. You may need to reschedule your appointment if you arrive late (15 or more minutes).  Arriving late affects you and other patients whose appointments are after yours.  Also, if you miss three or more appointments without notifying the office, you may be dismissed from the clinic at the provider's discretion.      For prescription refill requests, have your pharmacy contact our office and allow 72 hours for refills to be completed.    Today you received the following chemotherapy and/or immunotherapy agents Taxol , carboplatin         To help prevent nausea and vomiting after your treatment, we encourage you to take your nausea medication as directed.  BELOW ARE SYMPTOMS THAT SHOULD BE REPORTED IMMEDIATELY: *FEVER GREATER THAN 100.4 F (38 C) OR HIGHER *CHILLS OR SWEATING *NAUSEA AND VOMITING THAT IS NOT CONTROLLED WITH YOUR NAUSEA MEDICATION *UNUSUAL SHORTNESS OF BREATH *UNUSUAL BRUISING OR BLEEDING *URINARY PROBLEMS (pain or burning when urinating, or frequent urination) *BOWEL PROBLEMS (unusual diarrhea, constipation, pain near the anus) TENDERNESS IN MOUTH AND THROAT WITH OR WITHOUT PRESENCE OF ULCERS (sore throat, sores in mouth, or a toothache) UNUSUAL RASH, SWELLING OR PAIN  UNUSUAL VAGINAL DISCHARGE OR ITCHING   Items with * indicate a potential emergency and should be  followed up as soon as possible or go to the Emergency Department if any problems should occur.  Please show the CHEMOTHERAPY ALERT CARD or IMMUNOTHERAPY ALERT CARD at check-in to the Emergency Department and triage nurse.  Should you have questions after your visit or need to cancel or reschedule your appointment, please contact [facility] - A DEPT OF [facility] [phone]  and follow the prompts.  Office hours are 8:00 a.m. to 4:30 p.m. Monday - Friday. Please note that voicemails left after 4:00 p.m. may not be returned until the following business day.  We are closed weekends and major holidays. You have access to a nurse at all times for urgent questions. Please call the main number to the clinic [phone] and follow the prompts.  For any non-urgent questions, you may also contact your provider using MyChart. We now offer e-Visits for anyone 44 and older to request care online for non-urgent symptoms. For details visit [email or web address].   Also download the MyChart app! Go to the app store, search "MyChart", open the app, select [facility], and log in with your MyChart username and password.  Carboplatin  Injection What is this medication? CARBOPLATIN  (KAR boe pla tin) treats some types of cancer. It works by slowing down the growth of cancer cells. This medicine may be used for other purposes; ask your health care provider or pharmacist if you have questions. COMMON BRAND NAME(S): Paraplatin  What should I tell my care team before I take this medication? They need to know  if you have any of these conditions: Blood disorders Hearing problems Kidney disease Recent or ongoing radiation therapy An unusual or allergic reaction to carboplatin , cisplatin, other medications, foods, dyes, or preservatives Pregnant or trying to get pregnant Breast-feeding How should I use this medication? This medication is injected into a vein. It is given by your  care team in a hospital or clinic setting. Talk to your care team about the use of this medication in children. Special care may be needed. Overdosage: If you think you have taken too much of this medicine contact a poison control center or emergency room at once. NOTE: This medicine is only for you. Do not share this medicine with others. What if I miss a dose? Keep appointments for follow-up doses. It is important not to miss your dose. Call your care team if you are unable to keep an appointment. What may interact with this medication? Medications for seizures Some antibiotics, such as amikacin, gentamicin, neomycin, streptomycin, tobramycin Vaccines This list may not describe all possible interactions. Give your health care provider a list of all the medicines, herbs, non-prescription drugs, or dietary supplements you use. Also tell them if you smoke, drink alcohol, or use illegal drugs. Some items may interact with your medicine. What should I watch for while using this medication? Your condition will be monitored carefully while you are receiving this medication. You may need blood work while taking this medication. This medication may make you feel generally unwell. This is not uncommon, as chemotherapy can affect healthy cells as well as cancer cells. Report any side effects. Continue your course of treatment even though you feel ill unless your care team tells you to stop. In some cases, you may be given additional medications to help with side effects. Follow all directions for their use. This medication may increase your risk of getting an infection. Call your care team for advice if you get a fever, chills, sore throat, or other symptoms of a cold or flu. Do not treat yourself. Try to avoid being around people who are sick. Avoid taking medications that contain aspirin, acetaminophen , ibuprofen, naproxen, or ketoprofen unless instructed by your care team. These medications may hide a  fever. Be careful brushing or flossing your teeth or using a toothpick because you may get an infection or bleed more easily. If you have any dental work done, tell your dentist you are receiving this medication. Talk to your care team if you wish to become pregnant or think you might be pregnant. This medication can cause serious birth defects. Talk to your care team about effective forms of contraception. Do not breast-feed while taking this medication. What side effects may I notice from receiving this medication? Side effects that you should report to your care team as soon as possible: Allergic reactions--skin rash, itching, hives, swelling of the face, lips, tongue, or throat Infection--fever, chills, cough, sore throat, wounds that don't heal, pain or trouble when passing urine, general feeling of discomfort or being unwell Low red blood cell level--unusual weakness or fatigue, dizziness, headache, trouble breathing Pain, tingling, or numbness in the hands or feet, muscle weakness, change in vision, confusion or trouble speaking, loss of balance or coordination, trouble walking, seizures Unusual bruising or bleeding Side effects that usually do not require medical attention (report to your care team if they continue or are bothersome): Hair loss Nausea Unusual weakness or fatigue Vomiting This list may not describe all possible side effects. Call your doctor for medical  advice about side effects. You may report side effects to FDA at 1-800-FDA-1088. Where should I keep my medication? This medication is given in a hospital or clinic. It will not be stored at home. NOTE: This sheet is a summary. It may not cover all possible information. If you have questions about this medicine, talk to your doctor, pharmacist, or health care provider.  2024 Elsevier/Gold Standard (2021-11-02 00:00:00)Paclitaxel  Injection What is this medication? PACLITAXEL  (PAK li TAX el) treats some types of cancer. It  works by slowing down the growth of cancer cells. This medicine may be used for other purposes; ask your health care provider or pharmacist if you have questions. COMMON BRAND NAME(S): Onxol, Taxol  What should I tell my care team before I take this medication? They need to know if you have any of these conditions: Heart disease Liver disease Low white blood cell levels An unusual or allergic reaction to paclitaxel , other medications, foods, dyes, or preservatives If you or your partner are pregnant or trying to get pregnant Breast-feeding How should I use this medication? This medication is injected into a vein. It is given by your care team in a hospital or clinic setting. Talk to your care team about the use of this medication in children. While it may be given to children for selected conditions, precautions do apply. Overdosage: If you think you have taken too much of this medicine contact a poison control center or emergency room at once. NOTE: This medicine is only for you. Do not share this medicine with others. What if I miss a dose? Keep appointments for follow-up doses. It is important not to miss your dose. Call your care team if you are unable to keep an appointment. What may interact with this medication? Do not take this medication with any of the following: Live virus vaccines Other medications may affect the way this medication works. Talk with your care team about all of the medications you take. They may suggest changes to your treatment plan to lower the risk of side effects and to make sure your medications work as intended. This list may not describe all possible interactions. Give your health care provider a list of all the medicines, herbs, non-prescription drugs, or dietary supplements you use. Also tell them if you smoke, drink alcohol, or use illegal drugs. Some items may interact with your medicine. What should I watch for while using this medication? Your condition  will be monitored carefully while you are receiving this medication. You may need blood work while taking this medication. This medication may make you feel generally unwell. This is not uncommon as chemotherapy can affect healthy cells as well as cancer cells. Report any side effects. Continue your course of treatment even though you feel ill unless your care team tells you to stop. This medication can cause serious allergic reactions. To reduce the risk, your care team may give you other medications to take before receiving this one. Be sure to follow the directions from your care team. This medication may increase your risk of getting an infection. Call your care team for advice if you get a fever, chills, sore throat, or other symptoms of a cold or flu. Do not treat yourself. Try to avoid being around people who are sick. This medication may increase your risk to bruise or bleed. Call your care team if you notice any unusual bleeding. Be careful brushing or flossing your teeth or using a toothpick because you may get an infection  or bleed more easily. If you have any dental work done, tell your dentist you are receiving this medication. Talk to your care team if you may be pregnant. Serious birth defects can occur if you take this medication during pregnancy. Talk to your care team before breastfeeding. Changes to your treatment plan may be needed. What side effects may I notice from receiving this medication? Side effects that you should report to your care team as soon as possible: Allergic reactions--skin rash, itching, hives, swelling of the face, lips, tongue, or throat Heart rhythm changes--fast or irregular heartbeat, dizziness, feeling faint or lightheaded, chest pain, trouble breathing Increase in blood pressure Infection--fever, chills, cough, sore throat, wounds that don't heal, pain or trouble when passing urine, general feeling of discomfort or being unwell Low blood  pressure--dizziness, feeling faint or lightheaded, blurry vision Low red blood cell level--unusual weakness or fatigue, dizziness, headache, trouble breathing Painful swelling, warmth, or redness of the skin, blisters or sores at the infusion site Pain, tingling, or numbness in the hands or feet Slow heartbeat--dizziness, feeling faint or lightheaded, confusion, trouble breathing, unusual weakness or fatigue Unusual bruising or bleeding Side effects that usually do not require medical attention (report to your care team if they continue or are bothersome): Diarrhea Hair loss Joint pain Loss of appetite Muscle pain Nausea Vomiting This list may not describe all possible side effects. Call your doctor for medical advice about side effects. You may report side effects to FDA at 1-800-FDA-1088. Where should I keep my medication? This medication is given in a hospital or clinic. It will not be stored at home. NOTE: This sheet is a summary. It may not cover all possible information. If you have questions about this medicine, talk to your doctor, pharmacist, or health care provider.  2024 Elsevier/Gold Standard (2021-11-30 00:00:00)

## 2024-01-01 NOTE — Assessment & Plan Note (Addendum)
 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   S/p cerebellar lesion resection consistent with lung primary.   Oncology history as below. PET scan showed limited disease to lung and lymph nodes.  Discussed at tumor board, surgery is not a feasible option secondary to lymph node involvement.  Recommended chemoradiation. Caris: PD-L1: 100%  -Started thoracic radiation. C6D1 today.  Reports some nausea manageable with Compazine .  Labs and physical exam stable today and will proceed with chemotherapy.  Reemphasized common side effects including immunosuppression, nausea, vomiting.Last cycle of chemotherapy today as patient has only one more day of RT left next week. -Has leukopenia on labs today.  Normal ANC.  Continue with chemo RT -For nausea please take Compazine  as prescribed.  Zofran  can be taken after day 3.  Will hold off on steroids at this time -Discussed that we will obtain a CT scan to assess response 4 weeks after completing chemo RT and if stable or response is better we will do immunotherapy for 1 year with durvalumab -Will coordinate care and imaging with Dr. Cipriano Creeks, radiation oncologist.  Return to clinic in 5 weeks after CT scan to discuss results and further management

## 2024-01-01 NOTE — Assessment & Plan Note (Addendum)
 Patient has leukopenia without neutropenia at this time. Likely chemotherapy and radiation therapy-induced  - Continue to monitor for now - Discussed with Dr.Morris who said that he would continue RT. No indication to hold chemotherapy at this time - Recommended that patient to go to the ER with a fever >100.80F

## 2024-01-01 NOTE — Progress Notes (Signed)
 Nutrition Follow-up:  Pt with lung cancer metastatic to brain. S/p cerebellar mets resection (3/19 at [facility]). She is concurrent chemoRT to lung with weekly paclitaxel /carboplatin . Patient is under the care of Dr. Orvis Blare.   Met with patient in infusion. She is drinking [employer] at visit. Says this has been her saving grace. Patient reports tolerating therapy fairly well. She has felt really good in the last week. Appetite has been good. Patient denies NIS at this time.    Medications: reviewed   Labs: albumin 3.4  Anthropometrics: Pt 141 lb 12.1 oz on 6/2 - reports 144 lb today (no documentation at this time)  5/27 - 143 lb 8 oz    NUTRITION DIAGNOSIS: Food and nutrition related knowledge deficit improved     INTERVENTION:  Continue including good sources of protein at every meal/snack     MONITORING, EVALUATION, GOAL: wt trends, intake   NEXT VISIT: To be scheduled as needed. Patient has contact information

## 2024-01-01 NOTE — Assessment & Plan Note (Signed)
 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 Intermittent Not affecting food intake  - Use Carafate  as needed for symptomatic management

## 2024-01-01 NOTE — Progress Notes (Signed)
 Patient Care Team: Roise Cleaver, MD as PCP - General (Family Medicine) [name], MD as Consulting Physician (Rheumatology) [name], MD as Medical Oncologist (Medical Oncology) Gerhard Knuckles, RN as Oncology Nurse Navigator (Medical Oncology) Marc Senior, MD as Consulting Physician (Pulmonary Disease)  Clinic Day:  01/01/2024  Referring physician: Roise Cleaver, MD   CHIEF COMPLAINT:  CC: Metastatic lung adenocarcinoma   ASSESSMENT & PLAN:   Assessment & Plan: Julie Horn  is a 48 y.o. 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   S/p cerebellar lesion resection consistent with lung primary.   Oncology history as below. PET scan showed limited disease to lung and lymph nodes.  Discussed at tumor board, surgery is not a feasible option secondary to lymph node involvement.  Recommended chemoradiation. Caris: PD-L1: 100%  -Started thoracic radiation. C6D1 today.  Reports some nausea manageable with Compazine .  Labs and physical exam stable today and will proceed with chemotherapy.  Reemphasized common side effects including immunosuppression, nausea, vomiting.Last cycle of chemotherapy today as patient has only one more day of RT left next week. -Has leukopenia on labs today.  Normal ANC.  Continue with chemo RT -For nausea please take Compazine  as prescribed.  Zofran  can be taken after day 3.  Will hold off on steroids at this time -Discussed that we will obtain a CT scan to assess response 4 weeks after completing chemo RT and if stable or response is better we will do immunotherapy for 1 year with durvalumab -Will coordinate care and imaging with Dr. Cipriano Creeks, radiation oncologist.  Return to clinic in 5 weeks after CT scan to discuss results and further management   Metastasis to brain [facility]) Cerebellar  lesion s/p resection consistent with the poorly differentiated adenocarcinoma of lung primary. Patient completed gamma knife at [facility].  Currently on a clinical trial assessing gamma knife versus gamma tile Completed steroid taper.  - Continue to follow with [facility]. - MRI protocol per the study - Patient reports headaches that are only manageable with Fioricet.  Recommended to follow-up regarding headaches with [facility] team.   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 Intermittent Not affecting food intake  - Use Carafate  as needed for symptomatic management  Nicotine dependence with current use Patient quit smoking since a diagnosis of lung cancer  - Encouraged to abstain from smoking  Scar pain Scar from port placement is irritated and tender.  No signs of infection.  Improved with silicone patches  - Continue using silicone patches to the scar. - Use Tylenol  for pain management as needed.  Nausea without vomiting Patient has chemotherapy-induced nausea.  Now better controlled with Compazine   - Continue prescribed antinausea medications  Leukopenia Patient has leukopenia without neutropenia at this time. Likely chemotherapy and radiation therapy-induced  - Continue to monitor for now - Discussed with Dr.Morris who said that he would continue RT. No indication to hold chemotherapy at this time - Recommended that patient to go to the ER with a fever >100.81F   The patient understands the plans discussed today and is in agreement with them.  She knows to contact our office if she develops concerns prior to her next appointment.  I provided 20 minutes of face-to-face time during this encounter and > 50% was spent counseling as documented under my assessment and plan.    [name], MD  [facility]  [facility] [place] - A DEPT OF [facility] [address] [place] [address] Dept: [phone] Dept Fax:  [phone]      ONCOLOGY HISTORY:   Oncology History  Metastatic lung carcinoma, right (HCC)  09/25/2023 Imaging   CT CAP w contrast:  1. Spiculated mass in the posterior right pulmonary apex measuring 2.6 x 1.4 cm, consistent with primary bronchogenic malignancy. 2. Enlarged right hilar lymph node measuring 1.5 x 1.2 cm, consistent with nodal metastatic disease. 3. Tiny hypodensities in the central liver, incompletely characterized, although statistically likely benign cysts or hemangiomas. Small metastases not strictly excluded. 4. Soft tissue nodule within the right gluteal subcutaneous fat;given location most likely an injection granuloma, however subcutaneous soft tissue metastases are occasionally seen in metastatic lung malignancy. Attention on follow-up.     09/25/2023 Imaging   MRI brain w wo contrast:  1. Heterogeneously contrast-enhancing mass of the right cerebellar hemisphere with a large amount of surrounding vasogenic edema. Metastasis and hemangioblastoma are the most common infratentorial tumors in adults. Astrocytoma is another possibility. 2. Mass effect on the fourth ventricle without hydrocephalus.   09/27/2023 Initial Diagnosis   Metastatic lung carcinoma, right (HCC)   10/05/2023 PET scan   IMPRESSION: 1. Hypermetabolic RIGHT upper lobe lung nodule consistent primary bronchogenic carcinoma. 2. Mildly hypermetabolic RIGHT lower paratracheal and RIGHT hilar lymph nodes are concerning for nodal metastasis. No contralateral hypermetabolic lymph nodes. 3. No evidence of distant metastatic disease. 4. Intense activity through the distal anal canal is favored physiologic. Recommend digital rectal exam.   10/11/2023 Procedure   Resection of cerebellar metastasis   10/11/2023 Pathology Results   Diagnosis   A: Brain tumor, biopsy - Metastatic poorly differentiated adenocarcinoma consistent with lung primary.   B: Brain tumor, right, resection - Metastatic poorly  differentiated adenocarcinoma consistent with lung primary.   C: Brain tumor, right, specimen trap - Metastatic poorly differentiated adenocarcinoma consistent with lung primary.     10/26/2023 Imaging   MRI brain:  Status post right occipital craniotomy for mass resection. Marked reduction in edema and mass effect, with a now normal appearance of the fourth ventricle. Blood products are present in the region of the approach and resection, with a small amount of adjacent enhancement as would be expected at this time. The study is indeterminate for residual marginal tumor and additional follow-up is warranted.   10/27/2023 Pathology Results   Caris:  PD-L1: Positive, TPS: 100%, 3+ TMB: High, 15 mutation per MB  ALK, EGFR, KRAS, MET, RET, ROS1: Negative   11/03/2023 Procedure   Port placement   11/12/2023 - 11/17/2023 Radiation Therapy   5 fractions of SBRT for R cerebellar bed   11/27/2023 -  Chemotherapy   Patient is on Treatment Plan : LUNG Carboplatin  + Paclitaxel  + XRT q7d         Current Treatment:  Chemo RT  INTERVAL HISTORY:  Julie Horn is here today for follow up.  She is accompanied by her boyfriend today.  Patient has no new complaints today.  Reported improved symptoms.  She still complains of intermittent dysphagia but has not received Carafate  yet.  Overall is doing very well.  I have reviewed the past medical history, past surgical history, social history and family history with the patient and they are unchanged from previous note.  ALLERGIES:  is allergic to levofloxacin, sulfa antibiotics, azithromycin, doxycycline , latex, and wound dressing adhesive.  MEDICATIONS:  Current Outpatient Medications  Medication Sig  Dispense Refill   acetaminophen  (TYLENOL ) 500 MG tablet Take 1,000 mg by mouth every 6 (six) hours as needed for mild pain (pain score 1-3). Take 2 tablets (1,000 mg total) by mouth every six (6) hours.     albuterol  (VENTOLIN  HFA) 108 (90 Base)  MCG/ACT inhaler Inhale 2 puffs into the lungs every 4 (four) hours as needed for wheezing or shortness of breath. 6.7 g 2   ascorbic acid (VITAMIN C) 500 MG tablet Take 500 mg by mouth daily. Take 1 tablet (500 mg total) by mouth daily     budesonide -formoterol  (SYMBICORT ) 160-4.5 MCG/ACT inhaler Inhale 2 puffs into the lungs 2 (two) times daily. Rinse mouth well after use. 1 each 11   butalbital -acetaminophen -caffeine  (FIORICET) 50-325-40 MG tablet Take 1 tablet by mouth every 4 (four) hours as needed for headache. 14 tablet 1   CARBOPLATIN  IV Inject into the vein.     co-enzyme Q-10 30 MG capsule Take 30 mg by mouth 3 (three) times daily. Take 1 capsule (30 mg total) by mouth Three (3) times a day.     Cyanocobalamin (VITAMIN B 12) 500 MCG TABS Take 500 mg by mouth daily.     dexamethasone  (DECADRON ) 4 MG tablet Take 2 tablets daily for 2 days, start the day after chemotherapy. Take with food. 30 tablet 1   docusate sodium (COLACE) 100 MG capsule Take 100 mg by mouth 3 (three) times daily as needed for mild constipation. Take 1 capsule (100 mg total) by mouth Three (3) times a day as needed for constipation.     famotidine  (PEPCID ) 20 MG tablet Take 20 mg by mouth daily.     folic acid (FOLVITE) 400 MCG tablet Take 400 mcg by mouth daily.     guaiFENesin  (MUCINEX ) 600 MG 12 hr tablet Take 1,200 mg by mouth 2 (two) times daily. Take 1 tablet (1200 mg total) by mouth every twelve (12) hours.     hydrOXYzine  (VISTARIL ) 25 MG capsule Take 1 capsule (25 mg total) by mouth 3 (three) times daily. For anxiety and sleep. 90 capsule 5   ibuprofen (ADVIL) 100 MG tablet Take 200-400 mg by mouth every 6 (six) hours as needed for pain.     lidocaine  (XYLOCAINE ) 2 % solution Take 10 mLs by mouth.     lidocaine -prilocaine  (EMLA ) cream Apply to affected area once 30 g 3   loratadine (CLARITIN) 10 MG tablet Take 10 mg by mouth daily.     magnesium oxide (MAG-OX) 400 (240 Mg) MG tablet Take 400 mg by mouth daily.  Take 1 tablet (400 mg total) by mouth daily.     melatonin 1 MG TABS tablet Take 1 mg by mouth at bedtime. Take 5 tablets (5 mg total) by mouth nightly.     montelukast  (SINGULAIR ) 10 MG tablet Take 1 tablet (10 mg total) by mouth at bedtime. (Patient taking differently: Take 10 mg by mouth daily as needed (allergies).) 90 tablet 3   omeprazole (PRILOSEC) 20 MG capsule Take 20 mg by mouth daily.     ondansetron  (ZOFRAN ) 8 MG tablet Take 1 tablet (8 mg total) by mouth every 8 (eight) hours as needed for nausea or vomiting. Start on the third day after chemotherapy. 30 tablet 1   ondansetron  (ZOFRAN -ODT) 4 MG disintegrating tablet Take 1 tablet (4 mg total) by mouth every 8 (eight) hours as needed for nausea or vomiting. 30 tablet 5   PACLitaxel  (TAXOL  IV) Inject into the vein.  Palonosetron  HCl (ALOXI  IV) Inject into the vein.     prochlorperazine  (COMPAZINE ) 10 MG tablet Take 1 tablet (10 mg total) by mouth every 6 (six) hours as needed for nausea or vomiting. 30 tablet 1   sennosides-docusate sodium (SENOKOT-S) 8.6-50 MG tablet Take 2 tablets by mouth daily.     sucralfate  (CARAFATE ) 1 g tablet Take 1 tablet (1 g total) by mouth 4 (four) times daily. 120 tablet 0   traZODone  (DESYREL ) 150 MG tablet USE FROM 1/3 TO 1 TABLET NIGHTLY AS NEEDED FOR SLEEP. 90 tablet 1   No current facility-administered medications for this visit.   Facility-Administered Medications Ordered in Other Visits  Medication Dose Route Frequency Provider Last Rate Last Admin   0.9 %  sodium chloride  infusion   Intravenous Continuous [name], MD 10 mL/hr at 01/01/24 0955 New Bag at 01/01/24 0955   CARBOplatin  (PARAPLATIN ) 280 mg in sodium chloride  0.9 % 100 mL chemo infusion  280 mg Intravenous Once Manar Smalling, MD       heparin  lock flush 100 unit/mL  500 Units Intracatheter Once PRN Yariana Hoaglund, MD       PACLitaxel  (TAXOL ) 72 mg in sodium chloride  0.9 % 150 mL chemo infusion (</= 80mg /m2)  45 mg/m2  (Treatment Plan Recorded) Intravenous Once [name], MD       sodium chloride  flush (NS) 0.9 % injection 10 mL  10 mL Intracatheter PRN [name], MD        REVIEW OF SYSTEMS:   Constitutional: Denies fevers, chills or abnormal weight loss Eyes: Denies blurriness of vision Ears, nose, mouth, throat, and face: Denies mucositis or sore throat Respiratory: Denies cough, dyspnea or wheezes Cardiovascular: Denies palpitation, chest discomfort or lower extremity swelling Gastrointestinal:  Denies nausea, heartburn or change in bowel habits Skin: Denies abnormal skin rashes Lymphatics: Denies new lymphadenopathy or easy bruising Neurological:Denies numbness, tingling or new weaknesses Behavioral/Psych: Mood is stable, no new changes  All other systems were reviewed with the patient and are negative.   VITALS:  Last menstrual period 07/26/2019.  Wt Readings from Last 3 Encounters:  12/25/23 141 lb 12.1 oz (64.3 kg)  12/19/23 143 lb 8 oz (65.1 kg)  12/12/23 140 lb 3.4 oz (63.6 kg)    There is no height or weight on file to calculate BMI.  Performance status (ECOG): 0 - Asymptomatic  PHYSICAL EXAM:   GENERAL:alert, no distress and comfortable SKIN: Port insertion surgical site tender, erythematous.  No pus. LUNGS: clear to auscultation and percussion with normal breathing effort HEART: regular rate & rhythm and no murmurs and no lower extremity edema ABDOMEN:abdomen soft, non-tender and normal bowel sounds Musculoskeletal:no cyanosis of digits and no clubbing  NEURO: alert & oriented x 3 with fluent speech.  Postsurgical scar on the right occipital region.  LABORATORY DATA:  I have reviewed the data as listed  Lab Results  Component Value Date   WBC 1.7 (L) 01/01/2024   NEUTROABS 1.1 (L) 01/01/2024   HGB 11.1 (L) 01/01/2024   HCT 31.4 (L) 01/01/2024   MCV 96.9 01/01/2024   PLT 142 (L) 01/01/2024      Chemistry      Component Value Date/Time   NA 139  01/01/2024 0832   NA 145 (H) 05/22/2023 1054   K 3.7 01/01/2024 0832   CL 106 01/01/2024 0832   CO2 25 01/01/2024 0832   BUN 8 01/01/2024 0832   BUN 7 05/22/2023 1054   CREATININE 0.45 01/01/2024 6578  GLU 83 08/25/2015 0000      Component Value Date/Time   CALCIUM 8.9 01/01/2024 0832   ALKPHOS 44 01/01/2024 0832   AST 12 (L) 01/01/2024 0832   ALT 15 01/01/2024 0832   BILITOT 0.7 01/01/2024 0832   BILITOT 0.5 05/22/2023 1054       RADIOGRAPHIC STUDIES:  None to review

## 2024-01-01 NOTE — Telephone Encounter (Signed)
 Done

## 2024-01-01 NOTE — Assessment & Plan Note (Signed)
 Patient has chemotherapy-induced nausea.  Now better controlled with Compazine   - Continue prescribed antinausea medications

## 2024-01-01 NOTE — Patient Instructions (Addendum)
 VISIT SUMMARY:  Julie Horn, a 48 year old female, visited for a follow-up regarding her ongoing chemotherapy and radiation therapy for lung cancer. She reported improved fatigue and resolved issues with her medication. No fever or chills were noted.  YOUR PLAN:  -RADIATION THERAPY FOR CANCER: You are currently undergoing radiation therapy for lung cancer, with one session remaining. Radiation therapy uses high-energy rays to target and kill cancer cells. Your neutrophil counts are low but still adequate for chemotherapy. We are waiting for Dr. Cipriano Creeks to confirm whether you can continue with radiation and chemotherapy based on your current cell counts. If your fever exceeds 100.65F, please seek emergency care immediately. A CT scan is scheduled in five weeks, and we plan to start immunotherapy within four to six weeks after completing radiation therapy.  INSTRUCTIONS:  Please consult with Dr. Cipriano Creeks regarding the continuation of your radiation therapy. If Dr. Cipriano Creeks approves, you will proceed with chemotherapy. Remember to visit the emergency room if your fever exceeds 100.65F. Your CT scan is scheduled in five weeks, and we plan to start immunotherapy within four to six weeks after your radiation therapy is completed.

## 2024-01-02 ENCOUNTER — Other Ambulatory Visit: Payer: Self-pay

## 2024-01-02 DIAGNOSIS — C3411 Malignant neoplasm of upper lobe, right bronchus or lung: Secondary | ICD-10-CM | POA: Diagnosis not present

## 2024-01-02 DIAGNOSIS — G939 Disorder of brain, unspecified: Secondary | ICD-10-CM | POA: Diagnosis not present

## 2024-01-03 DIAGNOSIS — C3411 Malignant neoplasm of upper lobe, right bronchus or lung: Secondary | ICD-10-CM | POA: Diagnosis not present

## 2024-01-03 DIAGNOSIS — G939 Disorder of brain, unspecified: Secondary | ICD-10-CM | POA: Diagnosis not present

## 2024-01-04 DIAGNOSIS — C3411 Malignant neoplasm of upper lobe, right bronchus or lung: Secondary | ICD-10-CM | POA: Diagnosis not present

## 2024-01-04 DIAGNOSIS — G939 Disorder of brain, unspecified: Secondary | ICD-10-CM | POA: Diagnosis not present

## 2024-01-05 DIAGNOSIS — G939 Disorder of brain, unspecified: Secondary | ICD-10-CM | POA: Diagnosis not present

## 2024-01-05 DIAGNOSIS — C3411 Malignant neoplasm of upper lobe, right bronchus or lung: Secondary | ICD-10-CM | POA: Diagnosis not present

## 2024-01-06 ENCOUNTER — Other Ambulatory Visit: Payer: Self-pay | Admitting: Oncology

## 2024-01-06 DIAGNOSIS — C7801 Secondary malignant neoplasm of right lung: Secondary | ICD-10-CM

## 2024-01-08 ENCOUNTER — Encounter: Payer: Self-pay | Admitting: Oncology

## 2024-01-08 DIAGNOSIS — C3411 Malignant neoplasm of upper lobe, right bronchus or lung: Secondary | ICD-10-CM | POA: Diagnosis not present

## 2024-01-08 DIAGNOSIS — G939 Disorder of brain, unspecified: Secondary | ICD-10-CM | POA: Diagnosis not present

## 2024-01-15 ENCOUNTER — Ambulatory Visit: Admitting: Primary Care

## 2024-01-16 ENCOUNTER — Ambulatory Visit: Payer: Self-pay | Admitting: Pulmonary Disease

## 2024-01-16 ENCOUNTER — Ambulatory Visit (HOSPITAL_COMMUNITY)
Admission: RE | Admit: 2024-01-16 | Discharge: 2024-01-16 | Disposition: A | Discharge status: Home / Self Care | Source: Ambulatory Visit | Attending: Pulmonary Disease | Admitting: Pulmonary Disease

## 2024-01-16 DIAGNOSIS — J449 Chronic obstructive pulmonary disease, unspecified: Secondary | ICD-10-CM | POA: Diagnosis not present

## 2024-01-16 DIAGNOSIS — R0602 Shortness of breath: Secondary | ICD-10-CM | POA: Insufficient documentation

## 2024-01-16 LAB — PULMONARY FUNCTION TEST
DL/VA % pred: 58 %
DL/VA: 2.52 ml/min/mmHg/L
DLCO cor % pred: 51 %
DLCO cor: 11.07 ml/min/mmHg
DLCO unc % pred: 47 %
DLCO unc: 10.2 ml/min/mmHg
FEF 25-75 Post: 0.68 L/s
FEF 25-75 Pre: 0.31 L/s
FEF2575-%Change-Post: 117 %
FEF2575-%Pred-Post: 23 %
FEF2575-%Pred-Pre: 10 %
FEV1-%Change-Post: 40 %
FEV1-%Pred-Post: 41 %
FEV1-%Pred-Pre: 29 %
FEV1-Post: 1.2 L
FEV1-Pre: 0.85 L
FEV1FVC-%Change-Post: -4 %
FEV1FVC-%Pred-Pre: 49 %
FEV6-%Change-Post: 33 %
FEV6-%Pred-Post: 71 %
FEV6-%Pred-Pre: 53 %
FEV6-Post: 2.52 L
FEV6-Pre: 1.88 L
FEV6FVC-%Change-Post: -9 %
FEV6FVC-%Pred-Post: 82 %
FEV6FVC-%Pred-Pre: 90 %
FVC-%Change-Post: 47 %
FVC-%Pred-Post: 86 %
FVC-%Pred-Pre: 58 %
FVC-Post: 3.13 L
FVC-Pre: 2.12 L
Post FEV1/FVC ratio: 38 %
Post FEV6/FVC ratio: 80 %
Pre FEV1/FVC ratio: 40 %
Pre FEV6/FVC Ratio: 88 %
RV % pred: 195 %
RV: 3.34 L
TLC % pred: 120 %
TLC: 6.08 L

## 2024-01-16 MED ORDER — ALBUTEROL SULFATE (2.5 MG/3ML) 0.083% IN NEBU
2.5000 mg | INHALATION_SOLUTION | Freq: Once | RESPIRATORY_TRACT | Status: AC
  Administered 2024-01-16: 2.5 mg via RESPIRATORY_TRACT

## 2024-01-17 ENCOUNTER — Encounter: Payer: Self-pay | Admitting: Oncology

## 2024-01-18 ENCOUNTER — Other Ambulatory Visit: Payer: Self-pay

## 2024-01-30 ENCOUNTER — Encounter: Payer: Self-pay | Admitting: *Deleted

## 2024-01-30 NOTE — Progress Notes (Signed)
 Patient presents to the office for assessment of port site.  States that she has started experiencing tenderness above port site.  When assessed, site looks to be normal, with no signs of bruising or infection.  Keloid noted at insertion site, which could be the cause of tenderness.  Advised that she could try warm compresses to help the discomfort.  Verbalized understanding.

## 2024-02-05 DIAGNOSIS — R220 Localized swelling, mass and lump, head: Secondary | ICD-10-CM | POA: Diagnosis not present

## 2024-02-05 DIAGNOSIS — F32A Depression, unspecified: Secondary | ICD-10-CM | POA: Diagnosis not present

## 2024-02-05 DIAGNOSIS — Z87891 Personal history of nicotine dependence: Secondary | ICD-10-CM | POA: Diagnosis not present

## 2024-02-05 DIAGNOSIS — K219 Gastro-esophageal reflux disease without esophagitis: Secondary | ICD-10-CM | POA: Diagnosis not present

## 2024-02-05 DIAGNOSIS — Z825 Family history of asthma and other chronic lower respiratory diseases: Secondary | ICD-10-CM | POA: Diagnosis not present

## 2024-02-05 DIAGNOSIS — Z3202 Encounter for pregnancy test, result negative: Secondary | ICD-10-CM | POA: Diagnosis not present

## 2024-02-05 DIAGNOSIS — G936 Cerebral edema: Secondary | ICD-10-CM | POA: Diagnosis not present

## 2024-02-05 DIAGNOSIS — F4310 Post-traumatic stress disorder, unspecified: Secondary | ICD-10-CM | POA: Diagnosis not present

## 2024-02-05 DIAGNOSIS — C7931 Secondary malignant neoplasm of brain: Secondary | ICD-10-CM | POA: Diagnosis not present

## 2024-02-05 DIAGNOSIS — J45909 Unspecified asthma, uncomplicated: Secondary | ICD-10-CM | POA: Diagnosis not present

## 2024-02-05 DIAGNOSIS — F419 Anxiety disorder, unspecified: Secondary | ICD-10-CM | POA: Diagnosis not present

## 2024-02-05 DIAGNOSIS — I7300 Raynaud's syndrome without gangrene: Secondary | ICD-10-CM | POA: Diagnosis not present

## 2024-02-05 DIAGNOSIS — C3411 Malignant neoplasm of upper lobe, right bronchus or lung: Secondary | ICD-10-CM | POA: Diagnosis not present

## 2024-02-05 DIAGNOSIS — J439 Emphysema, unspecified: Secondary | ICD-10-CM | POA: Diagnosis not present

## 2024-02-06 ENCOUNTER — Encounter: Payer: Self-pay | Admitting: Oncology

## 2024-02-06 DIAGNOSIS — J439 Emphysema, unspecified: Secondary | ICD-10-CM | POA: Diagnosis not present

## 2024-02-06 DIAGNOSIS — C3411 Malignant neoplasm of upper lobe, right bronchus or lung: Secondary | ICD-10-CM | POA: Diagnosis not present

## 2024-02-06 DIAGNOSIS — Z87891 Personal history of nicotine dependence: Secondary | ICD-10-CM | POA: Diagnosis not present

## 2024-02-06 DIAGNOSIS — F32A Depression, unspecified: Secondary | ICD-10-CM | POA: Diagnosis not present

## 2024-02-06 DIAGNOSIS — F419 Anxiety disorder, unspecified: Secondary | ICD-10-CM | POA: Diagnosis not present

## 2024-02-06 DIAGNOSIS — Z3202 Encounter for pregnancy test, result negative: Secondary | ICD-10-CM | POA: Diagnosis not present

## 2024-02-06 DIAGNOSIS — G936 Cerebral edema: Secondary | ICD-10-CM | POA: Diagnosis not present

## 2024-02-06 DIAGNOSIS — F4310 Post-traumatic stress disorder, unspecified: Secondary | ICD-10-CM | POA: Diagnosis not present

## 2024-02-06 DIAGNOSIS — J45909 Unspecified asthma, uncomplicated: Secondary | ICD-10-CM | POA: Diagnosis not present

## 2024-02-06 DIAGNOSIS — I7300 Raynaud's syndrome without gangrene: Secondary | ICD-10-CM | POA: Diagnosis not present

## 2024-02-06 DIAGNOSIS — R220 Localized swelling, mass and lump, head: Secondary | ICD-10-CM | POA: Diagnosis not present

## 2024-02-06 DIAGNOSIS — Z825 Family history of asthma and other chronic lower respiratory diseases: Secondary | ICD-10-CM | POA: Diagnosis not present

## 2024-02-06 DIAGNOSIS — K219 Gastro-esophageal reflux disease without esophagitis: Secondary | ICD-10-CM | POA: Diagnosis not present

## 2024-02-07 ENCOUNTER — Ambulatory Visit (INDEPENDENT_AMBULATORY_CARE_PROVIDER_SITE_OTHER): Admitting: Internal Medicine

## 2024-02-07 ENCOUNTER — Encounter: Payer: Self-pay | Admitting: Internal Medicine

## 2024-02-07 ENCOUNTER — Ambulatory Visit (HOSPITAL_COMMUNITY)
Admission: RE | Admit: 2024-02-07 | Discharge: 2024-02-07 | Disposition: A | Discharge status: Home / Self Care | Source: Ambulatory Visit | Attending: Oncology | Admitting: Oncology

## 2024-02-07 VITALS — BP 102/60 | HR 87 | Ht 64.0 in | Wt 140.0 lb

## 2024-02-07 DIAGNOSIS — I700 Atherosclerosis of aorta: Secondary | ICD-10-CM | POA: Diagnosis not present

## 2024-02-07 DIAGNOSIS — C7801 Secondary malignant neoplasm of right lung: Secondary | ICD-10-CM | POA: Insufficient documentation

## 2024-02-07 DIAGNOSIS — C3490 Malignant neoplasm of unspecified part of unspecified bronchus or lung: Secondary | ICD-10-CM | POA: Diagnosis not present

## 2024-02-07 DIAGNOSIS — D259 Leiomyoma of uterus, unspecified: Secondary | ICD-10-CM | POA: Diagnosis not present

## 2024-02-07 DIAGNOSIS — J432 Centrilobular emphysema: Secondary | ICD-10-CM | POA: Diagnosis not present

## 2024-02-07 DIAGNOSIS — J449 Chronic obstructive pulmonary disease, unspecified: Secondary | ICD-10-CM | POA: Diagnosis not present

## 2024-02-07 MED ORDER — HEPARIN SOD (PORK) LOCK FLUSH 100 UNIT/ML IV SOLN
500.0000 [IU] | Freq: Once | INTRAVENOUS | Status: AC
  Administered 2024-02-07: 500 [IU] via INTRAVENOUS

## 2024-02-07 MED ORDER — AIRSUPRA 90-80 MCG/ACT IN AERO
2.0000 | INHALATION_SPRAY | RESPIRATORY_TRACT | 11 refills | Status: AC | PRN
Start: 2024-02-07 — End: ?

## 2024-02-07 MED ORDER — IOHEXOL 300 MG/ML  SOLN
100.0000 mL | Freq: Once | INTRAMUSCULAR | Status: AC | PRN
Start: 2024-02-07 — End: 2024-02-07
  Administered 2024-02-07: 100 mL via INTRAVENOUS

## 2024-02-07 NOTE — Assessment & Plan Note (Addendum)
 Quit smoking 06/2023  - FENO  11/02/23  = 17  - PFT's  01/16/24  FEV1 1.20 (41 % ) ratio 0.38  p 40 % improvement from saba p 0 prior to study with DLCO  10.2 (47%)   and FV curve classically concave  - 02/07/2024  After extensive coaching inhaler device,  effectiveness =    75% (trigger with delayed breath) > try air surpa prn as other inhalers have made her cough worse   Other options might include stiolto if find she is still limited from desired activities by doe but avoid high dose ICS here based on reported intol or use spacer.  Re SABA :  I spent extra time with pt today reviewing appropriate use of albuterol  for prn use on exertion with the following points: 1) saba is for relief of sob that does not improve by walking a slower pace or resting but rather if the pt does not improve after trying this first. 2) If the pt is convinced, as many are, that saba helps recover from activity faster then it's easy to tell if this is the case by re-challenging : ie stop, take the inhaler, then p 5 minutes try the exact same activity (intensity of workload) that just caused the symptoms and see if they are substantially diminished or not after saba 3) if there is an activity that reproducibly causes the symptoms, try the saba 15 min before the activity on alternate days   If in fact the saba really does help, then fine to continue to use it prn but advised may need to look closer at the maintenance regimen (nothing for now) being used to achieve better control of airways disease with exertion.   Discussed in detail all the  indications, usual  risks and alternatives  relative to the benefits with patient who agrees to proceed with Rx as outlined.         Each maintenance medication was reviewed in detail including emphasizing most importantly the difference between maintenance and prns and under what circumstances the prns are to be triggered using an action plan format where appropriate.  Total time for  H and P, chart review, counseling, reviewing hfa  device(s) and generating customized AVS unique to this office visit / same day charting = 40 min new pt eval

## 2024-02-07 NOTE — Progress Notes (Signed)
 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   S/p cerebellar lesion resection consistent with lung primary > RT and chemo   History of Present Illness  02/07/2024  Pulmonary/ 1st [facility] office eval/ Julie Horn / [facility] on no maint rx  Chief Complaint  Patient presents with   Follow-up    Transfer care from Dr. Tamea. She states that RT approx 4 wks ago. Her breathing may be worse slightly since the last visit.  She has minimal cough. She felt worse while on inhalers so stopped.   Dyspnea:  walking trail x 1.5 miles in half hour and having trouble with inclines  Cough: mostly with inhaler use if (symbicor 160 or trelegy) but not albuterol   Sleep: flat bed one pillow no resp cc  SABA use: once every few weeks  02: none     No obvious day to day or daytime pattern/variability or assoc excess/ purulent sputum or mucus plugs or hemoptysis or cp or chest tightness, subjective wheeze or overt sinus or hb symptoms.    Also denies any obvious fluctuation of symptoms with weather or environmental changes or other aggravating or alleviating factors except as outlined above   No unusual exposure hx or h/o childhood pna/ asthma or knowledge of premature birth.  Current Allergies, Complete Past Medical History, Past Surgical History, Family History, and Social History were reviewed in [employer] record.  ROS  The following are not active complaints unless bolded Hoarseness, sore throat, dysphagia, dental problems, itching, sneezing,  nasal congestion or discharge of excess mucus or  purulent secretions, ear ache,   fever, chills, sweats, unintended wt loss or wt gain, classically pleuritic or exertional cp,  orthopnea pnd or arm/hand swelling  or leg swelling, presyncope, palpitations, abdominal pain, anorexia, nausea, vomiting, diarrhea  or change in bowel habits or change in bladder habits, change in stools or change in urine, dysuria, hematuria,  rash, arthralgias, visual complaints, headache, numbness, weakness or ataxia or problems with walking or coordination,  change in mood or  memory.            Outpatient Medications Prior to Visit  Medication Sig Dispense Refill   acetaminophen  (TYLENOL ) 500 MG tablet Take 1,000 mg by mouth every 6 (six) hours as needed for mild pain (pain score 1-3). Take 2 tablets (1,000 mg total) by mouth every six (6) hours.     albuterol  (VENTOLIN  HFA) 108 (90 Base) MCG/ACT inhaler Inhale 2 puffs into the lungs every 4 (four) hours as needed for wheezing or shortness of breath. 6.7 g 2   ascorbic acid (VITAMIN C) 500 MG tablet Take 500 mg by mouth daily. Take 1 tablet (500 mg total) by mouth daily     butalbital -acetaminophen -caffeine  (FIORICET) 50-325-40 MG tablet Take 1 tablet by mouth every 4 (four) hours as needed for headache. 14 tablet 1   co-enzyme Q-10 30 MG capsule Take 30 mg by mouth 3 (three) times daily. Take 1 capsule (30 mg total) by mouth Three (3) times a day.  Cyanocobalamin (VITAMIN B 12) 500 MCG TABS Take 500 mg by mouth daily.     folic acid (FOLVITE) 400 MCG tablet Take 400 mcg by mouth daily.     guaiFENesin  (MUCINEX ) 600 MG 12 hr tablet Take 1,200 mg by mouth 2 (two) times daily. Take 1 tablet (1200 mg total) by mouth every twelve (12) hours.     hydrOXYzine  (VISTARIL ) 25 MG capsule Take 1 capsule (25 mg total) by mouth 3 (three) times daily. For anxiety and sleep. 90 capsule 5   ibuprofen (ADVIL) 100 MG tablet Take 200-400 mg by mouth every 6 (six) hours as needed for pain.     lidocaine -prilocaine  (EMLA ) cream Apply  to affected area once 30 g 3   magnesium oxide (MAG-OX) 400 (240 Mg) MG tablet Take 400 mg by mouth daily. Take 1 tablet (400 mg total) by mouth daily.     melatonin 1 MG TABS tablet Take 1 mg by mouth at bedtime. Take 5 tablets (5 mg total) by mouth nightly.     montelukast  (SINGULAIR ) 10 MG tablet Take 1 tablet (10 mg total) by mouth at bedtime. (Patient taking differently: Take 10 mg by mouth daily as needed (allergies).) 90 tablet 3   sennosides-docusate sodium (SENOKOT-S) 8.6-50 MG tablet Take 2 tablets by mouth daily.     traZODone  (DESYREL ) 150 MG tablet USE FROM 1/3 TO 1 TABLET NIGHTLY AS NEEDED FOR SLEEP. 90 tablet 1   budesonide -formoterol  (SYMBICORT ) 160-4.5 MCG/ACT inhaler Inhale 2 puffs into the lungs 2 (two) times daily. Rinse mouth well after use. 1 each 11   CARBOPLATIN  IV Inject into the vein.     dexamethasone  (DECADRON ) 4 MG tablet Take 2 tablets daily for 2 days, start the day after chemotherapy. Take with food. 30 tablet 1   docusate sodium (COLACE) 100 MG capsule Take 100 mg by mouth 3 (three) times daily as needed for mild constipation. Take 1 capsule (100 mg total) by mouth Three (3) times a day as needed for constipation.     famotidine  (PEPCID ) 20 MG tablet Take 20 mg by mouth daily.     lidocaine  (XYLOCAINE ) 2 % solution Take 10 mLs by mouth.     loratadine (CLARITIN) 10 MG tablet Take 10 mg by mouth daily.     omeprazole (PRILOSEC) 20 MG capsule Take 20 mg by mouth daily.     ondansetron  (ZOFRAN ) 8 MG tablet TAKE 1 TABLET (8 MG TOTAL) BY MOUTH EVERY 8 (EIGHT) HOURS AS NEEDED FOR NAUSEA OR VOMITING. START ON THE THIRD DAY AFTER CHEMOTHERAPY. 30 tablet 1   ondansetron  (ZOFRAN -ODT) 4 MG disintegrating tablet Take 1 tablet (4 mg total) by mouth every 8 (eight) hours as needed for nausea or vomiting. 30 tablet 5   PACLitaxel  (TAXOL  IV) Inject into the vein.     Palonosetron  HCl (ALOXI  IV) Inject into the vein.     prochlorperazine  (COMPAZINE ) 10 MG tablet Take 1 tablet (10 mg  total) by mouth every 6 (six) hours as needed for nausea or vomiting. 30 tablet 1   sucralfate  (CARAFATE ) 1 g tablet Take 1 tablet (1 g total) by mouth 4 (four) times daily. 120 tablet 0   No facility-administered medications prior to visit.    Past Medical History:  Diagnosis Date   Abnormal Pap smear of cervix 04/17/2018   Asthma    GERD (gastroesophageal reflux disease)    History of migraine 04/17/2018   Had been managed on Topamax but had side effect. Now not active.  Raynaud's disease    Spondylolisthesis of lumbar region 04/17/2018      Objective:     BP 102/60 (BP Location: Left Arm, Cuff Size: Normal)   Pulse 87   Ht 5' 4 (1.626 m)   Wt 140 lb (63.5 kg)   LMP 07/26/2019 (Exact Date)   SpO2 100% Comment: on RA  BMI 24.03 kg/m   SpO2: 100 % (on RA)   pleasant amb wf nad does not look ill considering what she's been thru   HEENT : Oropharynx  clear      Nasal turbinates nl    NECK :  without  apparent JVD/ palpable Nodes/TM    LUNGS: no acc muscle use,  Nl contour chest which is clear to A and P bilaterally without cough on insp or exp maneuvers   CV:  RRR  no s3 or murmur or increase in P2, and no edema   ABD:  soft and nontender   MS:  Gait nl   ext warm without deformities Or obvious joint restrictions  calf tenderness, cyanosis or clubbing    SKIN: warm and dry without lesions    NEURO:  alert, approp, nl sensorium with  no motor or cerebellar deficits apparent.       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  Discussed in detail all the  indications, usual  risks and alternatives  relative to the benefits with patient who agrees to proceed with Rx as outlined.         Each maintenance medication was reviewed in detail including emphasizing most importantly the difference between maintenance and prns and under what circumstances the prns are to be triggered using an action plan format where appropriate.  Total time for H and P, chart review, counseling, reviewing hfa  device(s) and generating customized AVS unique to this office visit / same day charting = 40 min new pt eval           Ozell America, MD 02/07/2024

## 2024-02-07 NOTE — Patient Instructions (Signed)
 AirSupra   1-2 puffs every 4 hours as needed (12 puffs in 24 hours)   Also  Ok to try albuterol  15 min before an activity (on alternating days)  that you know would usually make you short of breath and see if it makes any difference and if makes none then don't take albuterol  after activity unless you can't catch your breath as this means it's the resting that helps, not the albuterol .   Please schedule a follow up office visit in 6 weeks, call sooner if needed with inhaler

## 2024-02-08 ENCOUNTER — Other Ambulatory Visit: Payer: Self-pay

## 2024-02-14 ENCOUNTER — Inpatient Hospital Stay: Attending: Hematology | Admitting: Oncology

## 2024-02-14 ENCOUNTER — Inpatient Hospital Stay

## 2024-02-14 VITALS — BP 110/80 | HR 80 | Temp 97.1°F | Resp 18 | Wt 139.0 lb

## 2024-02-14 DIAGNOSIS — C7931 Secondary malignant neoplasm of brain: Secondary | ICD-10-CM | POA: Diagnosis not present

## 2024-02-14 DIAGNOSIS — Z95828 Presence of other vascular implants and grafts: Secondary | ICD-10-CM

## 2024-02-14 DIAGNOSIS — L905 Scar conditions and fibrosis of skin: Secondary | ICD-10-CM

## 2024-02-14 DIAGNOSIS — D72819 Decreased white blood cell count, unspecified: Secondary | ICD-10-CM | POA: Insufficient documentation

## 2024-02-14 DIAGNOSIS — C7801 Secondary malignant neoplasm of right lung: Secondary | ICD-10-CM | POA: Diagnosis not present

## 2024-02-14 DIAGNOSIS — K769 Liver disease, unspecified: Secondary | ICD-10-CM | POA: Diagnosis not present

## 2024-02-14 DIAGNOSIS — R110 Nausea: Secondary | ICD-10-CM | POA: Diagnosis not present

## 2024-02-14 DIAGNOSIS — F17200 Nicotine dependence, unspecified, uncomplicated: Secondary | ICD-10-CM | POA: Insufficient documentation

## 2024-02-14 DIAGNOSIS — R1310 Dysphagia, unspecified: Secondary | ICD-10-CM

## 2024-02-14 DIAGNOSIS — C3411 Malignant neoplasm of upper lobe, right bronchus or lung: Secondary | ICD-10-CM | POA: Diagnosis not present

## 2024-02-14 LAB — CBC WITH DIFFERENTIAL/PLATELET
Abs Immature Granulocytes: 0 K/uL (ref 0.00–0.07)
Basophils Absolute: 0 K/uL (ref 0.0–0.1)
Basophils Relative: 2 %
Eosinophils Absolute: 0.1 K/uL (ref 0.0–0.5)
Eosinophils Relative: 4 %
HCT: 37.5 % (ref 36.0–46.0)
Hemoglobin: 13 g/dL (ref 12.0–15.0)
Immature Granulocytes: 0 %
Lymphocytes Relative: 24 %
Lymphs Abs: 0.5 K/uL — ABNORMAL LOW (ref 0.7–4.0)
MCH: 33.9 pg (ref 26.0–34.0)
MCHC: 34.7 g/dL (ref 30.0–36.0)
MCV: 97.9 fL (ref 80.0–100.0)
Monocytes Absolute: 0.4 K/uL (ref 0.1–1.0)
Monocytes Relative: 18 %
Neutro Abs: 1.1 K/uL — ABNORMAL LOW (ref 1.7–7.7)
Neutrophils Relative %: 52 %
Platelets: 207 K/uL (ref 150–400)
RBC: 3.83 MIL/uL — ABNORMAL LOW (ref 3.87–5.11)
RDW: 13.3 % (ref 11.5–15.5)
WBC: 2.2 K/uL — ABNORMAL LOW (ref 4.0–10.5)
nRBC: 0 % (ref 0.0–0.2)

## 2024-02-14 LAB — COMPREHENSIVE METABOLIC PANEL WITH GFR
ALT: 11 U/L (ref 0–44)
AST: 13 U/L — ABNORMAL LOW (ref 15–41)
Albumin: 3.9 g/dL (ref 3.5–5.0)
Alkaline Phosphatase: 58 U/L (ref 38–126)
Anion gap: 9 (ref 5–15)
BUN: 6 mg/dL (ref 6–20)
CO2: 23 mmol/L (ref 22–32)
Calcium: 9.4 mg/dL (ref 8.9–10.3)
Chloride: 107 mmol/L (ref 98–111)
Creatinine, Ser: 0.49 mg/dL (ref 0.44–1.00)
GFR, Estimated: >60 mL/min (ref >60)
Glucose, Bld: 96 mg/dL (ref 70–99)
Potassium: 3.8 mmol/L (ref 3.5–5.1)
Sodium: 139 mmol/L (ref 135–145)
Total Bilirubin: 0.5 mg/dL (ref 0.0–1.2)
Total Protein: 6.7 g/dL (ref 6.5–8.1)

## 2024-02-14 LAB — MAGNESIUM: Magnesium: 2.1 mg/dL (ref 1.7–2.4)

## 2024-02-14 MED ORDER — SODIUM CHLORIDE FLUSH 0.9 % IV SOLN
10.0000 mL | Freq: Once | INTRAVENOUS | Status: AC
  Administered 2024-02-14: 10 mL via INTRAVENOUS
  Filled 2024-02-14: qty 10

## 2024-02-14 MED ORDER — HEPARIN SOD (PORK) LOCK FLUSH 100 UNIT/ML IV SOLN
500.0000 [IU] | Freq: Once | INTRAVENOUS | Status: AC
  Administered 2024-02-14: 500 [IU] via INTRAVENOUS

## 2024-02-14 NOTE — Patient Instructions (Signed)
 [facility] [place] [facility] - A DEPT OF [facility]  Discharge Instructions: Thank you for choosing [facility] to provide your oncology and hematology care.  If you have a lab appointment with the Cancer Center - please note that after April 8th, 2024, all labs will be drawn in the cancer center.  You do not have to check in or register with the main entrance as you have in the past but will complete your check-in in the cancer center.  Wear comfortable clothing and clothing appropriate for easy access to any Portacath or PICC line.   We strive to give you quality time with your provider. You may need to reschedule your appointment if you arrive late (15 or more minutes).  Arriving late affects you and other patients whose appointments are after yours.  Also, if you miss three or more appointments without notifying the office, you may be dismissed from the clinic at the provider's discretion.      For prescription refill requests, have your pharmacy contact our office and allow 72 hours for refills to be completed.    Today you received the following port flush return as scheduled.   To help prevent nausea and vomiting after your treatment, we encourage you to take your nausea medication as directed.  BELOW ARE SYMPTOMS THAT SHOULD BE REPORTED IMMEDIATELY: *FEVER GREATER THAN 100.4 F (38 C) OR HIGHER *CHILLS OR SWEATING *NAUSEA AND VOMITING THAT IS NOT CONTROLLED WITH YOUR NAUSEA MEDICATION *UNUSUAL SHORTNESS OF BREATH *UNUSUAL BRUISING OR BLEEDING *URINARY PROBLEMS (pain or burning when urinating, or frequent urination) *BOWEL PROBLEMS (unusual diarrhea, constipation, pain near the anus) TENDERNESS IN MOUTH AND THROAT WITH OR WITHOUT PRESENCE OF ULCERS (sore throat, sores in mouth, or a toothache) UNUSUAL RASH, SWELLING OR PAIN  UNUSUAL VAGINAL DISCHARGE OR ITCHING   Items with * indicate a potential emergency and should be followed up as soon as possible  or go to the Emergency Department if any problems should occur.  Please show the CHEMOTHERAPY ALERT CARD or IMMUNOTHERAPY ALERT CARD at check-in to the Emergency Department and triage nurse.  Should you have questions after your visit or need to cancel or reschedule your appointment, please contact [facility]  - A DEPT OF [facility] [phone]  and follow the prompts.  Office hours are 8:00 a.m. to 4:30 p.m. Monday - Friday. Please note that voicemails left after 4:00 p.m. may not be returned until the following business day.  We are closed weekends and major holidays. You have access to a nurse at all times for urgent questions. Please call the main number to the clinic [phone] and follow the prompts.  For any non-urgent questions, you may also contact your provider using MyChart. We now offer e-Visits for anyone 16 and older to request care online for non-urgent symptoms. For details visit [email or web address].   Also download the MyChart app! Go to the app store, search MyChart, open the app, select [facility], and log in with your MyChart username and password.

## 2024-02-14 NOTE — Progress Notes (Signed)
 Port flushed with good blood return noted. No bruising or swelling at site. Bandaid applied and patient discharged in satisfactory condition. VVS stable with no signs or symptoms of distressed noted.

## 2024-02-14 NOTE — Progress Notes (Signed)
 Patient Care Team: Zollie Lowers, MD as PCP - General (Family Medicine) Dolphus Reiter, MD as Consulting Physician (Rheumatology) Davonna Siad, MD as Medical Oncologist (Medical Oncology) Celestia Joesph SQUIBB, RN as Oncology Nurse Navigator (Medical Oncology) Tamea Dedra CROME, MD as Consulting Physician (Pulmonary Disease) Darlean Ozell NOVAK, MD as Consulting Physician (Pulmonary Disease)  Clinic Day:  02/18/2024  Referring physician: Zollie Lowers, MD   CHIEF COMPLAINT:  CC: Metastatic lung adenocarcinoma   ASSESSMENT & PLAN:   Assessment & Plan: Julie Horn  is a 48 y.o. 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   S/p cerebellar lesion resection consistent with lung primary.   Oncology history as below. PET scan showed limited disease to lung and lymph nodes.  Discussed at tumor board, surgery is not a feasible option secondary to lymph node involvement.  Recommended chemoradiation. Caris: PD-L1: 100% Completed chemoRT in 01/08/2024 Recent MRI brain with new sites of metastasis and CT scan within evidence of new liver lesion with response locally in the lung  - Will obtain MRI of abdomen with and without contrast to further evaluate the liver lesion.  If consistent with metastasis, will obtain a biopsy. - Will discuss at tumor board for further treatment options.  Can consider radiating the brain lesions and liver lesion and treat with adjuvant durvalumab considering patient has local treatment response Vs can treat with single agent pembrolizumab considering patient has PD-L1 of 100% or can also do chemotherapy with pembrolizumab followed by single agent pembrolizumab for better brain penetration.  Return to clinic next week to discuss results and further management Metastasis to brain [facility]) Cerebellar  lesion s/p resection consistent with the poorly differentiated adenocarcinoma of lung primary. Patient completed gamma knife at [facility].  Currently on a clinical trial assessing gamma knife versus gamma tile Completed steroid taper. Recent MRI brain with multiple new brain metastasis  - Continue to follow with [facility]. - Patient is planned to get SBRT for these lesions. - Patient reports headaches that are only manageable with Fioricet.    Leukopenia, unspecified type Patient has leukopenia without neutropenia at this time.  Improved from prior. Likely chemotherapy and radiation therapy-induced  - Continue to monitor for now Nicotine dependence with current use Patient quit smoking since a diagnosis of lung cancer  - Encouraged to abstain from smoking Nausea without vomiting Patient has chemotherapy-induced nausea.  Now better controlled with Compazine   Resolved at this time Dysphagia, unspecified type Likely secondary to radiation induced esophagitis. Intermittent Not affecting food intake  Resolved at this time Scar pain Scar from port placement is irritated and tender.  No signs of infection.  Improved with silicone patches  Resolved at this time    The patient understands the plans discussed today and is in agreement with them.  She knows to contact our office if she develops concerns prior to her next appointment.  I provided 30 minutes of face-to-face time during this encounter and > 50% was spent counseling as documented under my assessment and plan.    Siad Davonna, MD  [facility]  - A DEPT OF [facility] [address] [place] [address] Dept: [phone] Dept Fax: [phone]      ONCOLOGY HISTORY:   Oncology History  Metastatic lung carcinoma, right (HCC)  09/25/2023 Imaging   CT CAP w  contrast:  1. Spiculated mass in the posterior right pulmonary apex measuring 2.6 x 1.4 cm, consistent  with primary bronchogenic malignancy. 2. Enlarged right hilar lymph node measuring 1.5 x 1.2 cm, consistent with nodal metastatic disease. 3. Tiny hypodensities in the central liver, incompletely characterized, although statistically likely benign cysts or hemangiomas. Small metastases not strictly excluded. 4. Soft tissue nodule within the right gluteal subcutaneous fat;given location most likely an injection granuloma, however subcutaneous soft tissue metastases are occasionally seen in metastatic lung malignancy. Attention on follow-up.     09/25/2023 Imaging   MRI brain w wo contrast:  1. Heterogeneously contrast-enhancing mass of the right cerebellar hemisphere with a large amount of surrounding vasogenic edema. Metastasis and hemangioblastoma are the most common infratentorial tumors in adults. Astrocytoma is another possibility. 2. Mass effect on the fourth ventricle without hydrocephalus.   09/27/2023 Initial Diagnosis   Metastatic lung carcinoma, right (HCC)   10/05/2023 PET scan   IMPRESSION: 1. Hypermetabolic RIGHT upper lobe lung nodule consistent primary bronchogenic carcinoma. 2. Mildly hypermetabolic RIGHT lower paratracheal and RIGHT hilar lymph nodes are concerning for nodal metastasis. No contralateral hypermetabolic lymph nodes. 3. No evidence of distant metastatic disease. 4. Intense activity through the distal anal canal is favored physiologic. Recommend digital rectal exam.   10/11/2023 Procedure   Resection of cerebellar metastasis   10/11/2023 Pathology Results   Diagnosis   A: Brain tumor, biopsy - Metastatic poorly differentiated adenocarcinoma consistent with lung primary.   B: Brain tumor, right, resection - Metastatic poorly differentiated adenocarcinoma consistent with lung primary.   C: Brain tumor, right, specimen trap - Metastatic poorly differentiated adenocarcinoma consistent with lung primary.     10/26/2023 Imaging   MRI brain:  Status post right  occipital craniotomy for mass resection. Marked reduction in edema and mass effect, with a now normal appearance of the fourth ventricle. Blood products are present in the region of the approach and resection, with a small amount of adjacent enhancement as would be expected at this time. The study is indeterminate for residual marginal tumor and additional follow-up is warranted.   10/27/2023 Pathology Results   Caris:  PD-L1: Positive, TPS: 100%, 3+ TMB: High, 15 mutation per MB  ALK, EGFR, KRAS, MET, RET, ROS1: Negative   11/03/2023 Procedure   Port placement   11/12/2023 - 11/17/2023 Radiation Therapy   5 fractions of SBRT for R cerebellar bed   11/27/2023 -  Chemotherapy   Patient is on Treatment Plan : LUNG Carboplatin  + Paclitaxel  + XRT q7d     02/05/2024 Imaging   MRI brain with and without contrast:  Impression  -Postsurgical changes of right cerebellar mass resection with further collapse of the resection cavity. Decreasing peripheral enhancement of the resection cavity is favored postsurgical. -Multiple new enhancing supratentorial intracranial lesions and new nodular leptomeningeal enhancement in the left occipital lobe, concerning for worsening metastases. -- Questioned enhancement in the right greater than left internal auditory canals, potentially additional sites of leptomeningeal disease.   02/07/2024 Imaging   CT CAP with contrast:  IMPRESSION: 1. New 10 mm subcapsular low-density lesion in the anterior left liver. 2. Spiculated pulmonary nodule posterior right upper lobe is slightly decreased in size in the interval. 3. Right hilar lymph node measured previously at 12 mm short axis is 6 mm short axis today. No mediastinal lymphadenopathy by CT size criteria.       Current Treatment:  Chemo RT  INTERVAL HISTORY:  Julie Horn is here today for  follow up.  She experiences persistent headaches, sometimes severe, which do not resolve easily. She uses Fioricet as  needed, which provides relief.She feels exhausted. No nausea or vomiting is currently experienced.  She has no other complaints today.  We discussed the recent CT scan findings and that there is a new liver lesion.  Discussed the importance of MRI to further characterize this lesion.   I have reviewed the past medical history, past surgical history, social history and family history with the patient and they are unchanged from previous note.  ALLERGIES:  is allergic to levofloxacin, sulfa antibiotics, azithromycin, doxycycline , latex, and wound dressing adhesive.  MEDICATIONS:  Current Outpatient Medications  Medication Sig Dispense Refill   acetaminophen  (TYLENOL ) 500 MG tablet Take 1,000 mg by mouth every 6 (six) hours as needed for mild pain (pain score 1-3). Take 2 tablets (1,000 mg total) by mouth every six (6) hours.     Albuterol -Budesonide  (AIRSUPRA ) 90-80 MCG/ACT AERO Inhale 2 puffs into the lungs every 4 (four) hours as needed. 10.7 g 11   ascorbic acid (VITAMIN C) 500 MG tablet Take 500 mg by mouth daily. Take 1 tablet (500 mg total) by mouth daily     butalbital -acetaminophen -caffeine  (FIORICET) 50-325-40 MG tablet Take 1 tablet by mouth every 4 (four) hours as needed for headache. 14 tablet 1   co-enzyme Q-10 30 MG capsule Take 30 mg by mouth 3 (three) times daily. Take 1 capsule (30 mg total) by mouth Three (3) times a day.     Cyanocobalamin (VITAMIN B 12) 500 MCG TABS Take 500 mg by mouth daily.     folic acid (FOLVITE) 400 MCG tablet Take 400 mcg by mouth daily.     guaiFENesin  (MUCINEX ) 600 MG 12 hr tablet Take 1,200 mg by mouth 2 (two) times daily. Take 1 tablet (1200 mg total) by mouth every twelve (12) hours.     hydrOXYzine  (VISTARIL ) 25 MG capsule Take 1 capsule (25 mg total) by mouth 3 (three) times daily. For anxiety and sleep. 90 capsule 5   ibuprofen (ADVIL) 100 MG tablet Take 200-400 mg by mouth every 6 (six) hours as needed for pain.     lidocaine -prilocaine   (EMLA ) cream Apply to affected area once 30 g 3   magnesium oxide (MAG-OX) 400 (240 Mg) MG tablet Take 400 mg by mouth daily. Take 1 tablet (400 mg total) by mouth daily.     melatonin 1 MG TABS tablet Take 1 mg by mouth at bedtime. Take 5 tablets (5 mg total) by mouth nightly.     sennosides-docusate sodium (SENOKOT-S) 8.6-50 MG tablet Take 2 tablets by mouth daily.     traZODone  (DESYREL ) 150 MG tablet USE FROM 1/3 TO 1 TABLET NIGHTLY AS NEEDED FOR SLEEP. 90 tablet 1   No current facility-administered medications for this visit.    REVIEW OF SYSTEMS:   Constitutional: Denies fevers, chills or abnormal weight loss Eyes: Denies blurriness of vision Ears, nose, mouth, throat, and face: Denies mucositis or sore throat Respiratory: Denies cough, dyspnea or wheezes Cardiovascular: Denies palpitation, chest discomfort or lower extremity swelling Gastrointestinal:  Denies nausea, heartburn or change in bowel habits Skin: Denies abnormal skin rashes Lymphatics: Denies new lymphadenopathy or easy bruising Neurological:Denies numbness, tingling or new weaknesses Behavioral/Psych: Mood is stable, no new changes  All other systems were reviewed with the patient and are negative.   VITALS:  Last menstrual period 07/26/2019.  Wt Readings from Last 3 Encounters:  02/14/24 139 lb (63 kg)  02/07/24 140 lb (63.5 kg)  12/25/23 141 lb 12.1 oz (64.3 kg)    There is no height or weight on file to calculate BMI.  Performance status (ECOG): 0 - Asymptomatic  PHYSICAL EXAM:   GENERAL:alert, no distress and comfortable SKIN: Port insertion surgical site clean.  LUNGS: clear to auscultation and percussion with normal breathing effort HEART: regular rate & rhythm and no murmurs and no lower extremity edema ABDOMEN:abdomen soft, non-tender and normal bowel sounds Musculoskeletal:no cyanosis of digits and no clubbing  NEURO: alert & oriented x 3 with fluent speech.  Postsurgical scar on the right  occipital region.  LABORATORY DATA:  I have reviewed the data as listed  Lab Results  Component Value Date   WBC 2.2 (L) 02/14/2024   NEUTROABS 1.1 (L) 02/14/2024   HGB 13.0 02/14/2024   HCT 37.5 02/14/2024   MCV 97.9 02/14/2024   PLT 207 02/14/2024      Chemistry      Component Value Date/Time   NA 139 02/14/2024 0858   NA 145 (H) 05/22/2023 1054   K 3.8 02/14/2024 0858   CL 107 02/14/2024 0858   CO2 23 02/14/2024 0858   BUN 6 02/14/2024 0858   BUN 7 05/22/2023 1054   CREATININE 0.49 02/14/2024 0858   GLU 83 08/25/2015 0000      Component Value Date/Time   CALCIUM 9.4 02/14/2024 0858   ALKPHOS 58 02/14/2024 0858   AST 13 (L) 02/14/2024 0858   ALT 11 02/14/2024 0858   BILITOT 0.5 02/14/2024 0858   BILITOT 0.5 05/22/2023 1054       RADIOGRAPHIC STUDIES: I reviewed the following image findings and agree with the report   CLINICAL DATA:  Metastatic lung cancer. Restaging. * Tracking Code: BO *   EXAM: CT CHEST, ABDOMEN, AND PELVIS WITH CONTRAST   TECHNIQUE: Multidetector CT imaging of the chest, abdomen and pelvis was performed following the standard protocol during bolus administration of intravenous contrast.   RADIATION DOSE REDUCTION: This exam was performed according to the departmental dose-optimization program which includes automated exposure control, adjustment of the mA and/or kV according to patient size and/or use of iterative reconstruction technique.   CONTRAST:  OMNIPAQUE  IOHEXOL  300 MG/ML  SOLN   COMPARISON:  PET-CT 10/05/2023   FINDINGS: CT CHEST FINDINGS   Cardiovascular: The heart size is normal. No substantial pericardial effusion. Mild atherosclerotic calcification is noted in the wall of the thoracic aorta. Right Port-A-Cath tip is positioned in the right atrium.   Mediastinum/Nodes: No mediastinal lymphadenopathy. Right hilar lymph node measured previously at 12 mm short axis is 6 mm short axis today. No hilar  lymphadenopathy by CT size criteria. The esophagus has normal imaging features. There is no axillary lymphadenopathy.   Lungs/Pleura: Centrilobular and paraseptal emphysema evident. 2.5 x 1.2 cm spiculated pulmonary nodule posterior right upper lobe was measured previously at 2.6 x 1.4 cm. No other overtly suspicious pulmonary nodule or mass. No focal airspace consolidation. No pleural effusion.   Musculoskeletal: No worrisome lytic or sclerotic osseous abnormality.   CT ABDOMEN PELVIS FINDINGS   Hepatobiliary: There is a new 10 mm subcapsular low-density lesion in the anterior left liver (72/2). Additional scattered very tiny hypodensities in the liver parenchyma are stable in the interval, likely benign. Gallbladder is nondistended. No intrahepatic or extrahepatic biliary dilation.   Pancreas: No focal mass lesion. No dilatation of the main duct. No intraparenchymal cyst. No peripancreatic edema.   Spleen: No splenomegaly. No suspicious focal  mass lesion.   Adrenals/Urinary Tract: No adrenal nodule or mass. Kidneys unremarkable. No evidence for hydroureter. The urinary bladder appears normal for the degree of distention.   Stomach/Bowel: Stomach is unremarkable. No gastric wall thickening. No evidence of outlet obstruction. Duodenum is normally positioned as is the ligament of Treitz. No small bowel wall thickening. No small bowel dilatation. The terminal ileum is normal. The appendix is not well visualized, but there is no edema or inflammation in the region of the cecal tip to suggest appendicitis. No gross colonic mass. No colonic wall thickening.   Vascular/Lymphatic: No abdominal aortic aneurysm. No abdominal aortic atherosclerotic calcification. There is no gastrohepatic or hepatoduodenal ligament lymphadenopathy. No retroperitoneal or mesenteric lymphadenopathy. No pelvic sidewall lymphadenopathy.   Reproductive: There is no adnexal mass.  Uterine fibroids  evident.   Other: No intraperitoneal free fluid.   Musculoskeletal: No worrisome lytic or sclerotic osseous abnormality.   IMPRESSION: 1. New 10 mm subcapsular low-density lesion in the anterior left liver. Given that this represents a new interval finding in a patient with lung cancer, MRI of the abdomen with and without contrast recommended to further evaluate. 2. Spiculated pulmonary nodule posterior right upper lobe is slightly decreased in size in the interval. 3. Right hilar lymph node measured previously at 12 mm short axis is 6 mm short axis today. No mediastinal lymphadenopathy by CT size criteria. 4. Aortic Atherosclerosis (ICD10-I70.0) and Emphysema (ICD10-J43.9).     Electronically Signed   By: Camellia Candle M.D.   On: 02/13/2024 10:41

## 2024-02-15 DIAGNOSIS — G936 Cerebral edema: Secondary | ICD-10-CM | POA: Diagnosis not present

## 2024-02-15 DIAGNOSIS — C7931 Secondary malignant neoplasm of brain: Secondary | ICD-10-CM | POA: Diagnosis not present

## 2024-02-15 DIAGNOSIS — J439 Emphysema, unspecified: Secondary | ICD-10-CM | POA: Diagnosis not present

## 2024-02-15 DIAGNOSIS — F32A Depression, unspecified: Secondary | ICD-10-CM | POA: Diagnosis not present

## 2024-02-15 DIAGNOSIS — K219 Gastro-esophageal reflux disease without esophagitis: Secondary | ICD-10-CM | POA: Diagnosis not present

## 2024-02-15 DIAGNOSIS — R220 Localized swelling, mass and lump, head: Secondary | ICD-10-CM | POA: Diagnosis not present

## 2024-02-15 DIAGNOSIS — F419 Anxiety disorder, unspecified: Secondary | ICD-10-CM | POA: Diagnosis not present

## 2024-02-15 DIAGNOSIS — J45909 Unspecified asthma, uncomplicated: Secondary | ICD-10-CM | POA: Diagnosis not present

## 2024-02-15 DIAGNOSIS — C3411 Malignant neoplasm of upper lobe, right bronchus or lung: Secondary | ICD-10-CM | POA: Diagnosis not present

## 2024-02-15 DIAGNOSIS — I7300 Raynaud's syndrome without gangrene: Secondary | ICD-10-CM | POA: Diagnosis not present

## 2024-02-15 DIAGNOSIS — Z87891 Personal history of nicotine dependence: Secondary | ICD-10-CM | POA: Diagnosis not present

## 2024-02-15 DIAGNOSIS — F4310 Post-traumatic stress disorder, unspecified: Secondary | ICD-10-CM | POA: Diagnosis not present

## 2024-02-15 DIAGNOSIS — Z825 Family history of asthma and other chronic lower respiratory diseases: Secondary | ICD-10-CM | POA: Diagnosis not present

## 2024-02-15 DIAGNOSIS — Z3202 Encounter for pregnancy test, result negative: Secondary | ICD-10-CM | POA: Diagnosis not present

## 2024-02-16 ENCOUNTER — Ambulatory Visit (HOSPITAL_COMMUNITY)
Admission: RE | Admit: 2024-02-16 | Discharge: 2024-02-16 | Disposition: A | Discharge status: Home / Self Care | Source: Ambulatory Visit | Attending: Oncology | Admitting: Oncology

## 2024-02-16 DIAGNOSIS — C7801 Secondary malignant neoplasm of right lung: Secondary | ICD-10-CM | POA: Diagnosis not present

## 2024-02-16 DIAGNOSIS — K769 Liver disease, unspecified: Secondary | ICD-10-CM | POA: Diagnosis not present

## 2024-02-16 DIAGNOSIS — C3490 Malignant neoplasm of unspecified part of unspecified bronchus or lung: Secondary | ICD-10-CM | POA: Diagnosis not present

## 2024-02-16 DIAGNOSIS — K7689 Other specified diseases of liver: Secondary | ICD-10-CM | POA: Diagnosis not present

## 2024-02-16 MED ORDER — GADOBUTROL 1 MMOL/ML IV SOLN
6.0000 mL | Freq: Once | INTRAVENOUS | Status: AC | PRN
  Administered 2024-02-16: 6 mL via INTRAVENOUS

## 2024-02-18 NOTE — Assessment & Plan Note (Addendum)
 Patient has leukopenia without neutropenia at this time.  Improved from prior. Likely chemotherapy and radiation therapy-induced  - Continue to monitor for now

## 2024-02-18 NOTE — Assessment & Plan Note (Addendum)
 Scar from port placement is irritated and tender.  No signs of infection.  Improved with silicone patches  Resolved at this time

## 2024-02-18 NOTE — Assessment & Plan Note (Addendum)
 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 Intermittent Not affecting food intake  Resolved at this time

## 2024-02-18 NOTE — Assessment & Plan Note (Addendum)
 Patient quit smoking since a diagnosis of lung cancer  - Encouraged to abstain from smoking

## 2024-02-18 NOTE — Assessment & Plan Note (Addendum)
 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 Patient completed gamma knife at [facility].  Currently on a clinical trial assessing gamma knife versus gamma tile Completed steroid taper. Recent MRI brain with multiple new brain metastasis  - Continue to follow with [facility]. - Patient is planned to get SBRT for these lesions. - Patient reports headaches that are only manageable with Fioricet.

## 2024-02-18 NOTE — Assessment & Plan Note (Addendum)
 Patient has chemotherapy-induced nausea.  Now better controlled with Compazine   Resolved at this time

## 2024-02-18 NOTE — Assessment & Plan Note (Addendum)
 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   S/p cerebellar lesion resection consistent with lung primary.   Oncology history as below. PET scan showed limited disease to lung and lymph nodes.  Discussed at tumor board, surgery is not a feasible option secondary to lymph node involvement.  Recommended chemoradiation. Caris: PD-L1: 100% Completed chemoRT in 01/08/2024 Recent MRI brain with new sites of metastasis and CT scan within evidence of new liver lesion with response locally in the lung  - Will obtain MRI of abdomen with and without contrast to further evaluate the liver lesion.  If consistent with metastasis, will obtain a biopsy. - Will discuss at tumor board for further treatment options.  Can consider radiating the brain lesions and liver lesion and treat with adjuvant durvalumab considering patient has local treatment response Vs can treat with single agent pembrolizumab considering patient has PD-L1 of 100% or can also do chemotherapy with pembrolizumab followed by single agent pembrolizumab for better brain penetration.  Return to clinic next week to discuss results and further management

## 2024-02-19 ENCOUNTER — Other Ambulatory Visit: Payer: Self-pay | Admitting: Oncology

## 2024-02-19 DIAGNOSIS — C7801 Secondary malignant neoplasm of right lung: Secondary | ICD-10-CM

## 2024-02-19 NOTE — Progress Notes (Signed)
 DISCONTINUE ON PATHWAY REGIMEN - Non-Small Cell Lung     A cycle is every 21 days:     Pembrolizumab      Pemetrexed   **Always confirm dose/schedule in your pharmacy ordering system**  PRIOR TREATMENT: OND624: Pembrolizumab 200 mg + Pemetrexed 500 mg/m2 q21 Days  START ON PATHWAY REGIMEN - Non-Small Cell Lung     A cycle is every 21 days:     Pembrolizumab   **Always confirm dose/schedule in your pharmacy ordering system**  Patient Characteristics: Stage IV Metastatic, Nonsquamous, Molecular Analysis Completed, Molecular Alteration Present and Targeted Therapy Exhausted OR KRAS G12C+ or HER2+ or NRG1+ or c-Met Present and No Prior Chemo/Immunotherapy OR No Alteration Present, Second Line -  Chemotherapy/Immunotherapy, PS = 0, 1, No Prior PD-1/PD-L1  Inhibitor and Immunotherapy Candidate Therapeutic Status: Stage IV Metastatic Histology: Nonsquamous Cell Broad Molecular Profiling Status: Animal nutritionist Analysis Results: No Alteration Present ECOG Performance Status: 0 Chemotherapy/Immunotherapy Line of Therapy: Second Line Chemotherapy/Immunotherapy Immunotherapy Candidate Status: Candidate for Immunotherapy Prior Immunotherapy Status: No Prior PD-1/PD-L1 Inhibitor Intent of Therapy: Non-Curative / Palliative Intent, Discussed with Patient

## 2024-02-19 NOTE — Progress Notes (Signed)
 DISCONTINUE ON PATHWAY REGIMEN - Non-Small Cell Lung     A cycle is every 7 days, concurrent with RT:     Paclitaxel       Carboplatin    **Always confirm dose/schedule in your pharmacy ordering system**  PRIOR TREATMENT: OND647: Carboplatin  AUC=2 + Paclitaxel  45 mg/m2 Weekly During Radiation  START ON PATHWAY REGIMEN - Non-Small Cell Lung     A cycle is every 21 days:     Pembrolizumab      Pemetrexed   **Always confirm dose/schedule in your pharmacy ordering system**  Patient Characteristics: Stage IV Metastatic, Nonsquamous, Molecular Analysis Completed, Molecular Alteration Present and Targeted Therapy Exhausted OR KRAS G12C+ or HER2+ or NRG1+ or c-Met Present and No Prior Chemo/Immunotherapy OR No Alteration Present, Maintenance  Chemotherapy/Immunotherapy, PS = 0, 1, Initial Pembrolizumab + Pemetrexed + Carboplatin  Therapeutic Status: Stage IV Metastatic Histology: Nonsquamous Cell Broad Molecular Profiling Status: Animal nutritionist Analysis Results: No Alteration Present ECOG Performance Status: 0 Chemotherapy/Immunotherapy Line of Therapy: Maintenance Chemotherapy/Immunotherapy Intent of Therapy: Non-Curative / Palliative Intent, Discussed with Patient

## 2024-02-21 DIAGNOSIS — R220 Localized swelling, mass and lump, head: Secondary | ICD-10-CM | POA: Diagnosis not present

## 2024-02-21 DIAGNOSIS — J439 Emphysema, unspecified: Secondary | ICD-10-CM | POA: Diagnosis not present

## 2024-02-21 DIAGNOSIS — Z825 Family history of asthma and other chronic lower respiratory diseases: Secondary | ICD-10-CM | POA: Diagnosis not present

## 2024-02-21 DIAGNOSIS — K219 Gastro-esophageal reflux disease without esophagitis: Secondary | ICD-10-CM | POA: Diagnosis not present

## 2024-02-21 DIAGNOSIS — C7931 Secondary malignant neoplasm of brain: Secondary | ICD-10-CM | POA: Diagnosis not present

## 2024-02-21 DIAGNOSIS — F419 Anxiety disorder, unspecified: Secondary | ICD-10-CM | POA: Diagnosis not present

## 2024-02-21 DIAGNOSIS — J45909 Unspecified asthma, uncomplicated: Secondary | ICD-10-CM | POA: Diagnosis not present

## 2024-02-21 DIAGNOSIS — G936 Cerebral edema: Secondary | ICD-10-CM | POA: Diagnosis not present

## 2024-02-21 DIAGNOSIS — Z3202 Encounter for pregnancy test, result negative: Secondary | ICD-10-CM | POA: Diagnosis not present

## 2024-02-21 DIAGNOSIS — I7300 Raynaud's syndrome without gangrene: Secondary | ICD-10-CM | POA: Diagnosis not present

## 2024-02-21 DIAGNOSIS — Z87891 Personal history of nicotine dependence: Secondary | ICD-10-CM | POA: Diagnosis not present

## 2024-02-21 DIAGNOSIS — C3411 Malignant neoplasm of upper lobe, right bronchus or lung: Secondary | ICD-10-CM | POA: Diagnosis not present

## 2024-02-21 DIAGNOSIS — F32A Depression, unspecified: Secondary | ICD-10-CM | POA: Diagnosis not present

## 2024-02-21 DIAGNOSIS — F4310 Post-traumatic stress disorder, unspecified: Secondary | ICD-10-CM | POA: Diagnosis not present

## 2024-02-22 ENCOUNTER — Other Ambulatory Visit: Payer: Self-pay

## 2024-02-23 ENCOUNTER — Encounter: Payer: Self-pay | Admitting: Oncology

## 2024-02-23 DIAGNOSIS — J439 Emphysema, unspecified: Secondary | ICD-10-CM | POA: Diagnosis not present

## 2024-02-23 DIAGNOSIS — I7300 Raynaud's syndrome without gangrene: Secondary | ICD-10-CM | POA: Diagnosis not present

## 2024-02-23 DIAGNOSIS — Z87891 Personal history of nicotine dependence: Secondary | ICD-10-CM | POA: Diagnosis not present

## 2024-02-23 DIAGNOSIS — F32A Depression, unspecified: Secondary | ICD-10-CM | POA: Diagnosis not present

## 2024-02-23 DIAGNOSIS — K219 Gastro-esophageal reflux disease without esophagitis: Secondary | ICD-10-CM | POA: Diagnosis not present

## 2024-02-23 DIAGNOSIS — C7931 Secondary malignant neoplasm of brain: Secondary | ICD-10-CM | POA: Diagnosis not present

## 2024-02-23 DIAGNOSIS — C3411 Malignant neoplasm of upper lobe, right bronchus or lung: Secondary | ICD-10-CM | POA: Diagnosis not present

## 2024-02-23 DIAGNOSIS — F419 Anxiety disorder, unspecified: Secondary | ICD-10-CM | POA: Diagnosis not present

## 2024-02-23 DIAGNOSIS — F4310 Post-traumatic stress disorder, unspecified: Secondary | ICD-10-CM | POA: Diagnosis not present

## 2024-02-23 DIAGNOSIS — Z3202 Encounter for pregnancy test, result negative: Secondary | ICD-10-CM | POA: Diagnosis not present

## 2024-02-23 DIAGNOSIS — R220 Localized swelling, mass and lump, head: Secondary | ICD-10-CM | POA: Diagnosis not present

## 2024-02-23 DIAGNOSIS — J45909 Unspecified asthma, uncomplicated: Secondary | ICD-10-CM | POA: Diagnosis not present

## 2024-02-23 DIAGNOSIS — G936 Cerebral edema: Secondary | ICD-10-CM | POA: Diagnosis not present

## 2024-02-23 DIAGNOSIS — Z825 Family history of asthma and other chronic lower respiratory diseases: Secondary | ICD-10-CM | POA: Diagnosis not present

## 2024-02-23 NOTE — Progress Notes (Signed)
 The proposed treatment discussed in conference is for discussion purpose only and is not a binding recommendation.  The patients have not been physically examined, or presented with their treatment options.  Therefore, final treatment plans cannot be decided.

## 2024-02-26 ENCOUNTER — Encounter: Payer: Self-pay | Admitting: Oncology

## 2024-02-27 ENCOUNTER — Other Ambulatory Visit: Payer: Self-pay | Admitting: *Deleted

## 2024-02-27 ENCOUNTER — Inpatient Hospital Stay: Attending: Hematology

## 2024-02-27 ENCOUNTER — Inpatient Hospital Stay (HOSPITAL_BASED_OUTPATIENT_CLINIC_OR_DEPARTMENT_OTHER): Admitting: Oncology

## 2024-02-27 ENCOUNTER — Inpatient Hospital Stay

## 2024-02-27 ENCOUNTER — Encounter: Payer: Self-pay | Admitting: Oncology

## 2024-02-27 VITALS — BP 113/74 | HR 74 | Temp 97.5°F | Resp 18

## 2024-02-27 DIAGNOSIS — Z5111 Encounter for antineoplastic chemotherapy: Secondary | ICD-10-CM | POA: Diagnosis not present

## 2024-02-27 DIAGNOSIS — F17200 Nicotine dependence, unspecified, uncomplicated: Secondary | ICD-10-CM | POA: Insufficient documentation

## 2024-02-27 DIAGNOSIS — C7801 Secondary malignant neoplasm of right lung: Secondary | ICD-10-CM | POA: Diagnosis not present

## 2024-02-27 DIAGNOSIS — Z79620 Long term (current) use of immunosuppressive biologic: Secondary | ICD-10-CM | POA: Diagnosis not present

## 2024-02-27 DIAGNOSIS — C787 Secondary malignant neoplasm of liver and intrahepatic bile duct: Secondary | ICD-10-CM | POA: Diagnosis not present

## 2024-02-27 DIAGNOSIS — C3411 Malignant neoplasm of upper lobe, right bronchus or lung: Secondary | ICD-10-CM | POA: Diagnosis not present

## 2024-02-27 DIAGNOSIS — C7931 Secondary malignant neoplasm of brain: Secondary | ICD-10-CM | POA: Insufficient documentation

## 2024-02-27 DIAGNOSIS — Z5112 Encounter for antineoplastic immunotherapy: Secondary | ICD-10-CM | POA: Insufficient documentation

## 2024-02-27 DIAGNOSIS — D72819 Decreased white blood cell count, unspecified: Secondary | ICD-10-CM

## 2024-02-27 LAB — TSH: TSH: 2.645 u[IU]/mL (ref 0.350–4.500)

## 2024-02-27 LAB — CBC WITH DIFFERENTIAL/PLATELET
Abs Immature Granulocytes: 0.02 K/uL (ref 0.00–0.07)
Basophils Absolute: 0 K/uL (ref 0.0–0.1)
Basophils Relative: 1 %
Eosinophils Absolute: 0.1 K/uL (ref 0.0–0.5)
Eosinophils Relative: 5 %
HCT: 37.7 % (ref 36.0–46.0)
Hemoglobin: 13.2 g/dL (ref 12.0–15.0)
Immature Granulocytes: 1 %
Lymphocytes Relative: 17 %
Lymphs Abs: 0.5 K/uL — ABNORMAL LOW (ref 0.7–4.0)
MCH: 34.4 pg — ABNORMAL HIGH (ref 26.0–34.0)
MCHC: 35 g/dL (ref 30.0–36.0)
MCV: 98.2 fL (ref 80.0–100.0)
Monocytes Absolute: 0.5 K/uL (ref 0.1–1.0)
Monocytes Relative: 15 %
Neutro Abs: 1.8 K/uL (ref 1.7–7.7)
Neutrophils Relative %: 61 %
Platelets: 197 K/uL (ref 150–400)
RBC: 3.84 MIL/uL — ABNORMAL LOW (ref 3.87–5.11)
RDW: 12.6 % (ref 11.5–15.5)
WBC: 2.9 K/uL — ABNORMAL LOW (ref 4.0–10.5)
nRBC: 0 % (ref 0.0–0.2)

## 2024-02-27 LAB — COMPREHENSIVE METABOLIC PANEL WITH GFR
ALT: 12 U/L (ref 0–44)
AST: 12 U/L — ABNORMAL LOW (ref 15–41)
Albumin: 4 g/dL (ref 3.5–5.0)
Alkaline Phosphatase: 57 U/L (ref 38–126)
Anion gap: 10 (ref 5–15)
BUN: 9 mg/dL (ref 6–20)
CO2: 23 mmol/L (ref 22–32)
Calcium: 9.2 mg/dL (ref 8.9–10.3)
Chloride: 107 mmol/L (ref 98–111)
Creatinine, Ser: 0.49 mg/dL (ref 0.44–1.00)
GFR, Estimated: >60 mL/min (ref >60)
Glucose, Bld: 91 mg/dL (ref 70–99)
Potassium: 3.7 mmol/L (ref 3.5–5.1)
Sodium: 140 mmol/L (ref 135–145)
Total Bilirubin: 0.8 mg/dL (ref 0.0–1.2)
Total Protein: 6.6 g/dL (ref 6.5–8.1)

## 2024-02-27 LAB — MAGNESIUM: Magnesium: 2.3 mg/dL (ref 1.7–2.4)

## 2024-02-27 MED ORDER — SODIUM CHLORIDE 0.9 % IV SOLN
200.0000 mg | Freq: Once | INTRAVENOUS | Status: AC
  Administered 2024-02-27: 200 mg via INTRAVENOUS
  Filled 2024-02-27: qty 8

## 2024-02-27 MED ORDER — SODIUM CHLORIDE 0.9 % IV SOLN: INTRAVENOUS | Status: DC

## 2024-02-27 MED ORDER — CYANOCOBALAMIN 1000 MCG/ML IJ SOLN
1000.0000 ug | Freq: Once | INTRAMUSCULAR | Status: AC
  Administered 2024-02-27: 1000 ug via INTRAMUSCULAR
  Filled 2024-02-27: qty 1

## 2024-02-27 MED ORDER — FOLIC ACID 1 MG PO TABS: 1.0000 mg | ORAL_TABLET | Freq: Every day | ORAL | 6 refills | Status: AC

## 2024-02-27 MED ORDER — DEXAMETHASONE 4 MG PO TABS
ORAL_TABLET | ORAL | 0 refills | Status: AC
Start: 2024-02-27 — End: ?

## 2024-02-27 NOTE — Progress Notes (Signed)
 Patient Care Team: Zollie Lowers, MD as PCP - General (Family Medicine) Dolphus Reiter, MD as Consulting Physician (Rheumatology) Davonna Siad, MD as Medical Oncologist (Medical Oncology) Celestia Joesph SQUIBB, RN as Oncology Nurse Navigator (Medical Oncology) Tamea Dedra CROME, MD as Consulting Physician (Pulmonary Disease) Darlean Ozell NOVAK, MD as Consulting Physician (Pulmonary Disease)  Clinic Day:  02/27/2024  Referring physician: Zollie Lowers, MD   CHIEF COMPLAINT:  CC: Metastatic lung adenocarcinoma   ASSESSMENT & PLAN:   Assessment & Plan: Julie Horn  is a 48 y.o. 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   S/p cerebellar lesion resection consistent with lung primary.   Oncology history as below. PET scan showed limited disease to lung and lymph nodes.  Discussed at tumor board, surgery is not a feasible option secondary to lymph node involvement.  Recommended chemoradiation. Caris: PD-L1: 100% Completed chemoRT on 01/08/2024 Recent MRI brain with new sites of metastasis and CT scan within evidence of new liver lesion with response locally in the lung. MRI liver with confirmation of liver metastasis.  - Discussed risk versus benefits of liver biopsy.  Patient does not want to do liver biopsy at this time.  Stated that she would consider at a later point if she does not respond to treatment. - As per discussion at the thoracic tumor board, will start patient on chemoimmunotherapy-carboplatin , pemetrexed  and pembrolizumab .  Discussed risk versus benefits of chemotherapy along with most common side effects including nausea, vomiting, peripheral neuropathy, folate and vitamin B12 deficiency, rash, thyroid  problems, diarrhea, inflammation of lung and liver. -We will start Keytruda  today and I will add the  chemotherapy this week and then continue every 3 weeks - Will obtain a PET scan 2 weeks after completing chemotherapy I.e 4 cycles. - Educated patient on reaching out if any of the above discussed symptoms persist.  Return to clinic before next cycle of chemoimmunotherapy. Metastasis to brain [facility]) Cerebellar lesion s/p resection consistent with the poorly differentiated adenocarcinoma of lung primary. Patient completed gamma knife at [facility].  Currently on a clinical trial assessing gamma knife versus gamma tile Completed steroid taper. Recent MRI brain with multiple new brain metastasis.  Completed SBRT at [facility]  - Continue to follow with [facility]. - Patient reports headaches that are only manageable with Fioricet.   - Next MRI brain scheduled for October  Metastasis to liver [facility]) Recent PET scan with evidence of liver metastasis confirmed by MRI  - Discussed risk versus benefits of liver biopsy.  Patient does not want to do liver biopsy at this time.  Stated that she would consider at a later point if she does not respond to treatment. - Will continue to monitor this on PET scan  Leukopenia, unspecified type Patient has leukopenia without neutropenia at this time.  Significantly improved from prior. Likely chemotherapy and radiation therapy-induced  - Continue to monitor for now Nicotine dependence with current use Patient quit smoking since a diagnosis of lung cancer  - Encouraged to abstain from smoking    The patient understands the plans discussed today and is in agreement with them.  She knows to contact our office if she develops concerns prior to her next appointment.  I provided 30 minutes of face-to-face time during this encounter and > 50% was spent counseling as documented under my assessment and plan.    Siad Davonna, MD  [facility] [place] - A DEPT OF [facility] [address] [place] [address] Dept:  [phone] Dept Fax: [phone]      ONCOLOGY HISTORY:   Oncology History  Metastatic lung carcinoma, right (HCC)  09/25/2023 Imaging   CT CAP w contrast:  1. Spiculated mass in the posterior right pulmonary apex measuring 2.6 x 1.4 cm, consistent with primary bronchogenic malignancy. 2. Enlarged right hilar lymph node measuring 1.5 x 1.2 cm, consistent with nodal metastatic disease. 3. Tiny hypodensities in the central liver, incompletely characterized, although statistically likely benign cysts or hemangiomas. Small metastases not strictly excluded. 4. Soft tissue nodule within the right gluteal subcutaneous fat;given location most likely an injection granuloma, however subcutaneous soft tissue metastases are occasionally seen in metastatic lung malignancy. Attention on follow-up.     09/25/2023 Imaging   MRI brain w wo contrast:  1. Heterogeneously contrast-enhancing mass of the right cerebellar hemisphere with a large amount of surrounding vasogenic edema. Metastasis and hemangioblastoma are the most common infratentorial tumors in adults. Astrocytoma is another possibility. 2. Mass effect on the fourth ventricle without hydrocephalus.   09/27/2023 Initial Diagnosis   Metastatic lung carcinoma, right (HCC)   10/05/2023 PET scan   IMPRESSION: 1. Hypermetabolic RIGHT upper lobe lung nodule consistent primary bronchogenic carcinoma. 2. Mildly hypermetabolic RIGHT lower paratracheal and RIGHT hilar lymph nodes are concerning for nodal metastasis. No contralateral hypermetabolic lymph nodes. 3. No evidence of distant metastatic disease. 4. Intense activity through the distal anal canal is favored physiologic. Recommend digital rectal exam.   10/11/2023 Procedure   Resection of cerebellar metastasis   10/11/2023 Pathology Results   Diagnosis   A: Brain tumor, biopsy - Metastatic poorly differentiated adenocarcinoma consistent with lung primary.   B: Brain tumor, right,  resection - Metastatic poorly differentiated adenocarcinoma consistent with lung primary.   C: Brain tumor, right, specimen trap - Metastatic poorly differentiated adenocarcinoma consistent with lung primary.     10/26/2023 Imaging   MRI brain:  Status post right occipital craniotomy for mass resection. Marked reduction in edema and mass effect, with a now normal appearance of the fourth ventricle. Blood products are present in the region of the approach and resection, with a small amount of adjacent enhancement as would be expected at this time. The study is indeterminate for residual marginal tumor and additional follow-up is warranted.   10/27/2023 Pathology Results   Caris:  PD-L1: Positive, TPS: 100%, 3+ TMB: High, 15 mutation per MB  ALK, EGFR, KRAS, MET, RET, ROS1: Negative   11/03/2023 Procedure   Port placement   11/12/2023 - 11/17/2023 Radiation Therapy   5 fractions of SBRT for R cerebellar bed   11/27/2023 - 01/01/2024 Chemotherapy   Patient is on Treatment Plan : LUNG Carboplatin  + Paclitaxel  + XRT q7d     02/05/2024 Imaging   MRI brain with and without contrast:  Impression  -Postsurgical changes of right cerebellar mass resection with further collapse of the resection cavity. Decreasing peripheral enhancement of the resection cavity is favored postsurgical. -Multiple new enhancing supratentorial intracranial lesions and new nodular leptomeningeal enhancement in the left occipital lobe, concerning for worsening metastases. -- Questioned enhancement in the right greater than left internal auditory canals, potentially additional sites of leptomeningeal disease.   02/07/2024 Imaging   CT CAP with contrast:  IMPRESSION: 1. New 10 mm subcapsular low-density lesion in the anterior left liver. 2. Spiculated pulmonary nodule posterior right upper lobe  is slightly decreased in size in the interval. 3. Right hilar lymph node measured previously at 12 mm short axis is 6 mm short  axis today. No mediastinal lymphadenopathy by CT size criteria.   02/16/2024 Imaging   MRI liver with and without contrast:  IMPRESSION: 1.4 cm rim-enhancing mass in segment 4B of the left hepatic lobe, highly suspicious for liver metastasis.   02/23/2024 -  Radiation Therapy   SBRT to the new brain metastasis   02/26/2024 - 02/26/2024 Chemotherapy   Patient is on Treatment Plan : LUNG NSCLC Pembrolizumab  (200) q21d     02/27/2024 -  Chemotherapy   Patient is on Treatment Plan : LUNG Carboplatin  (5) + Pemetrexed  (500) + Pembrolizumab  (200) D1 q21d Induction x 4 cycles / Maintenance Pemetrexed  (500) + Pembrolizumab  (200) D1 q21d         Current Treatment: Planned chemoimmunotherapy with carboplatin , pemetrexed  and pembrolizumab   INTERVAL HISTORY:  Julie Horn is here today for follow up.  Patient reported that she significantly feels better compared to what she had felt all these days.  She has no new complaints today.  She had 1 episode of abdominal pain followed by diarrhea after MRI abdomen.   She has good appetite and has not lost weight.  We discussed the MRI liver findings consistent with liver metastasis and also tumor board discussion to add chemo therapy.  Patient is in agreement with this plan.  Will proceed with immunotherapy today and add chemotherapy this week and then we will schedule altogether every 3 weeks.  I have reviewed the past medical history, past surgical history, social history and family history with the patient and they are unchanged from previous note.  ALLERGIES:  is allergic to levofloxacin, sulfa antibiotics, azithromycin, doxycycline , latex, and wound dressing adhesive.  MEDICATIONS:  Current Outpatient Medications  Medication Sig Dispense Refill   acetaminophen  (TYLENOL ) 500 MG tablet Take 1,000 mg by mouth every 6 (six) hours as needed for mild pain (pain score 1-3). Take 2 tablets (1,000 mg total) by mouth every six (6) hours.      Albuterol -Budesonide  (AIRSUPRA ) 90-80 MCG/ACT AERO Inhale 2 puffs into the lungs every 4 (four) hours as needed. 10.7 g 11   ascorbic acid (VITAMIN C) 500 MG tablet Take 500 mg by mouth daily. Take 1 tablet (500 mg total) by mouth daily     butalbital -acetaminophen -caffeine  (FIORICET) 50-325-40 MG tablet Take 1 tablet by mouth every 4 (four) hours as needed for headache. 14 tablet 1   co-enzyme Q-10 30 MG capsule Take 30 mg by mouth 3 (three) times daily. Take 1 capsule (30 mg total) by mouth Three (3) times a day.     Cyanocobalamin  (VITAMIN B 12) 500 MCG TABS Take 500 mg by mouth daily.     folic acid  (FOLVITE ) 400 MCG tablet Take 400 mcg by mouth daily.     guaiFENesin  (MUCINEX ) 600 MG 12 hr tablet Take 1,200 mg by mouth 2 (two) times daily. Take 1 tablet (1200 mg total) by mouth every twelve (12) hours.     hydrOXYzine  (VISTARIL ) 25 MG capsule Take 1 capsule (25 mg total) by mouth 3 (three) times daily. For anxiety and sleep. 90 capsule 5   ibuprofen (ADVIL) 100 MG tablet Take 200-400 mg by mouth every 6 (six) hours as needed for pain.     magnesium oxide (MAG-OX) 400 (240 Mg) MG tablet Take 400 mg by mouth daily. Take 1 tablet (400 mg total) by mouth daily.  melatonin 1 MG TABS tablet Take 1 mg by mouth at bedtime. Take 5 tablets (5 mg total) by mouth nightly.     sennosides-docusate sodium (SENOKOT-S) 8.6-50 MG tablet Take 2 tablets by mouth daily.     traZODone  (DESYREL ) 150 MG tablet USE FROM 1/3 TO 1 TABLET NIGHTLY AS NEEDED FOR SLEEP. 90 tablet 1   No current facility-administered medications for this visit.   Facility-Administered Medications Ordered in Other Visits  Medication Dose Route Frequency Provider Last Rate Last Admin   0.9 %  sodium chloride  infusion   Intravenous Continuous Dorien Bessent, MD   Stopped at 02/27/24 1205    REVIEW OF SYSTEMS:   Constitutional: Denies fevers, chills or abnormal weight loss Eyes: Denies blurriness of vision Ears, nose, mouth, throat,  and face: Denies mucositis or sore throat Respiratory: Denies cough, dyspnea or wheezes Cardiovascular: Denies palpitation, chest discomfort or lower extremity swelling Gastrointestinal:  Denies nausea, heartburn or change in bowel habits Skin: Denies abnormal skin rashes Lymphatics: Denies new lymphadenopathy or easy bruising Neurological:Denies numbness, tingling or new weaknesses Behavioral/Psych: Mood is stable, no new changes  All other systems were reviewed with the patient and are negative.   VITALS:  Last menstrual period 07/26/2019.  Wt Readings from Last 3 Encounters:  02/27/24 142 lb 4.8 oz (64.5 kg)  02/14/24 139 lb (63 kg)  02/07/24 140 lb (63.5 kg)    There is no height or weight on file to calculate BMI.  Performance status (ECOG): 0 - Asymptomatic  PHYSICAL EXAM:   GENERAL:alert, no distress and comfortable SKIN: Port insertion surgical site clean.  LUNGS: clear to auscultation and percussion with normal breathing effort HEART: regular rate & rhythm and no murmurs and no lower extremity edema ABDOMEN:abdomen soft, non-tender and normal bowel sounds Musculoskeletal:no cyanosis of digits and no clubbing  NEURO: alert & oriented x 3 with fluent speech.  Postsurgical scar on the right occipital region-healed well with new hair growth.  LABORATORY DATA:  I have reviewed the data as listed  Lab Results  Component Value Date   WBC 2.9 (L) 02/27/2024   NEUTROABS 1.8 02/27/2024   HGB 13.2 02/27/2024   HCT 37.7 02/27/2024   MCV 98.2 02/27/2024   PLT 197 02/27/2024      Chemistry      Component Value Date/Time   NA 140 02/27/2024 0853   NA 145 (H) 05/22/2023 1054   K 3.7 02/27/2024 0853   CL 107 02/27/2024 0853   CO2 23 02/27/2024 0853   BUN 9 02/27/2024 0853   BUN 7 05/22/2023 1054   CREATININE 0.49 02/27/2024 0853   GLU 83 08/25/2015 0000      Component Value Date/Time   CALCIUM 9.2 02/27/2024 0853   ALKPHOS 57 02/27/2024 0853   AST 12 (L)  02/27/2024 0853   ALT 12 02/27/2024 0853   BILITOT 0.8 02/27/2024 0853   BILITOT 0.5 05/22/2023 1054       RADIOGRAPHIC STUDIES: I reviewed the following image findings and agree with the report  MR LIVER W WO CONTRAST CLINICAL DATA:  Liver lesion on recent CT.  Lung carcinoma.  EXAM: MRI ABDOMEN WITHOUT AND WITH CONTRAST  TECHNIQUE: Multiplanar multisequence MR imaging of the abdomen was performed both before and after the administration of intravenous contrast.  CONTRAST:  6mL GADAVIST  GADOBUTROL  1 MMOL/ML IV SOLN  COMPARISON:  CT on 02/07/2024  FINDINGS: Lower chest: No acute findings.  Hepatobiliary: A few tiny sub-cm hepatic cysts are noted. A 1.4 x  1.1 cm rim enhancing mass with T2 hyperintensity is seen in segment 4 B of the left lobe on image 51/17, highly suspicious for liver metastasis. No other suspicious liver lesions identified.  Gallbladder is unremarkable. No evidence of biliary ductal dilatation.  Pancreas:  No mass or inflammatory changes.  Spleen:  Within normal limits in size and appearance.  Adrenals/Urinary Tract: No suspicious adrenal or renal masses identified. No evidence of hydronephrosis.  Stomach/Bowel: Unremarkable.  Vascular/Lymphatic: No pathologically enlarged lymph nodes identified. No acute vascular findings.  Other:  None.  Musculoskeletal:  No suspicious bone lesions identified.  IMPRESSION: 1.4 cm rim-enhancing mass in segment 4B of the left hepatic lobe, highly suspicious for liver metastasis.  No other sites of metastatic disease identified within the abdomen.  Electronically Signed   By: Norleen DELENA Kil M.D.   On: 02/16/2024 17:25

## 2024-02-27 NOTE — Patient Instructions (Signed)
 [facility] [place] - A DEPT OF [facility]  Discharge Instructions: Thank you for choosing [facility] to provide your oncology and hematology care.  If you have a lab appointment with the Cancer Center - please note that after April 8th, 2024, all labs will be drawn in the cancer center.  You do not have to check in or register with the main entrance as you have in the past but will complete your check-in in the cancer center.  Wear comfortable clothing and clothing appropriate for easy access to any Portacath or PICC line.   We strive to give you quality time with your provider. You may need to reschedule your appointment if you arrive late (15 or more minutes).  Arriving late affects you and other patients whose appointments are after yours.  Also, if you miss three or more appointments without notifying the office, you may be dismissed from the clinic at the provider's discretion.      For prescription refill requests, have your pharmacy contact our office and allow 72 hours for refills to be completed.    Today you received the following chemotherapy and/or immunotherapy agents B12 and Keytruda , return as scheduled.   To help prevent nausea and vomiting after your treatment, we encourage you to take your nausea medication as directed.  BELOW ARE SYMPTOMS THAT SHOULD BE REPORTED IMMEDIATELY: *FEVER GREATER THAN 100.4 F (38 C) OR HIGHER *CHILLS OR SWEATING *NAUSEA AND VOMITING THAT IS NOT CONTROLLED WITH YOUR NAUSEA MEDICATION *UNUSUAL SHORTNESS OF BREATH *UNUSUAL BRUISING OR BLEEDING *URINARY PROBLEMS (pain or burning when urinating, or frequent urination) *BOWEL PROBLEMS (unusual diarrhea, constipation, pain near the anus) TENDERNESS IN MOUTH AND THROAT WITH OR WITHOUT PRESENCE OF ULCERS (sore throat, sores in mouth, or a toothache) UNUSUAL RASH, SWELLING OR PAIN  UNUSUAL VAGINAL DISCHARGE OR ITCHING   Items with * indicate a potential emergency  and should be followed up as soon as possible or go to the Emergency Department if any problems should occur.  Please show the CHEMOTHERAPY ALERT CARD or IMMUNOTHERAPY ALERT CARD at check-in to the Emergency Department and triage nurse.  Should you have questions after your visit or need to cancel or reschedule your appointment, please contact [facility] [place] - A DEPT OF [facility] [phone]  and follow the prompts.  Office hours are 8:00 a.m. to 4:30 p.m. Monday - Friday. Please note that voicemails left after 4:00 p.m. may not be returned until the following business day.  We are closed weekends and major holidays. You have access to a nurse at all times for urgent questions. Please call the main number to the clinic [phone] and follow the prompts.  For any non-urgent questions, you may also contact your provider using MyChart. We now offer e-Visits for anyone 64 and older to request care online for non-urgent symptoms. For details visit [email or web address].   Also download the MyChart app! Go to the app store, search MyChart, open the app, select [place], and log in with your MyChart username and password.

## 2024-02-27 NOTE — Assessment & Plan Note (Addendum)
 Patient quit smoking since a diagnosis of lung cancer  - Encouraged to abstain from smoking

## 2024-02-27 NOTE — Assessment & Plan Note (Addendum)
 Patient has leukopenia without neutropenia at this time.  Significantly improved from prior. Likely chemotherapy and radiation therapy-induced  - Continue to monitor for now

## 2024-02-27 NOTE — Progress Notes (Signed)
 Patient is to receive only Keytruda  today.  First dose of Carboplatin /Alimta  will be in later this week when PA approved.  Patient to receive B-12 injection today.  V.O. Dr Ivery Horn, PharmD  Pharmacist Chemotherapy Monitoring - Initial Assessment    Anticipated start date: 02/27/24   The following has been reviewed per standard work regarding the patient's treatment regimen: The patient's diagnosis, treatment plan and drug doses, and organ/hematologic function Lab orders and baseline tests specific to treatment regimen  The treatment plan start date, drug sequencing, and pre-medications Prior authorization status  Patient's documented medication list, including drug-drug interaction screen and prescriptions for anti-emetics and supportive care specific to the treatment regimen The drug concentrations, fluid compatibility, administration routes, and timing of the medications to be used The patient's access for treatment and lifetime cumulative dose history, if applicable  The patient's medication allergies and previous infusion related reactions, if applicable   Changes made to treatment plan:  N/A  Follow up needed:  N/A  Plan updated to 3 drug modality - Keytruda  - Alimta  - Carboplatin  -  Confirming home medications with MD.    Julie Horn, RPH, 02/27/2024  10:32 AM

## 2024-02-27 NOTE — Assessment & Plan Note (Addendum)
 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   S/p cerebellar lesion resection consistent with lung primary.   Oncology history as below. PET scan showed limited disease to lung and lymph nodes.  Discussed at tumor board, surgery is not a feasible option secondary to lymph node involvement.  Recommended chemoradiation. Caris: PD-L1: 100% Completed chemoRT on 01/08/2024 Recent MRI brain with new sites of metastasis and CT scan within evidence of new liver lesion with response locally in the lung. MRI liver with confirmation of liver metastasis.  - Discussed risk versus benefits of liver biopsy.  Patient does not want to do liver biopsy at this time.  Stated that she would consider at a later point if she does not respond to treatment. - As per discussion at the thoracic tumor board, will start patient on chemoimmunotherapy-carboplatin , pemetrexed  and pembrolizumab .  Discussed risk versus benefits of chemotherapy along with most common side effects including nausea, vomiting, peripheral neuropathy, folate and vitamin B12 deficiency, rash, thyroid  problems, diarrhea, inflammation of lung and liver. -We will start Keytruda  today and I will add the chemotherapy this week and then continue every 3 weeks - Will obtain a PET scan 2 weeks after completing chemotherapy I.e 4 cycles. - Educated patient on reaching out if any of the above discussed symptoms persist.  Return to clinic before next cycle of chemoimmunotherapy.

## 2024-02-27 NOTE — Assessment & Plan Note (Addendum)
 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 Patient completed gamma knife at [facility].  Currently on a clinical trial assessing gamma knife versus gamma tile Completed steroid taper. Recent MRI brain with multiple new brain metastasis.  Completed SBRT at [facility]  - Continue to follow with [facility]. - Patient reports headaches that are only manageable with Fioricet.   - Next MRI brain scheduled for October

## 2024-02-27 NOTE — Progress Notes (Signed)
 Patients port flushed without difficulty.  Good blood return noted with no bruising or swelling noted at site.  Patient remains accessed for treatment.

## 2024-02-27 NOTE — Progress Notes (Signed)
   02/27/24 1000  Spiritual Encounters  Type of Visit Initial  Care provided to: Patient  Referral source Chaplain assessment  Reason for visit Routine spiritual support  OnCall Visit No  Spiritual Framework  Presenting Themes Meaning/purpose/sources of inspiration;Goals in life/care;Values and beliefs;Significant life change;Caregiving needs;Coping tools;Courage hope and growth;Community and relationships  Community/Connection Family;Significant other  Patient Stress Factors Health changes;Major life changes  Interventions  Spiritual Care Interventions Made Established relationship of care and support;Compassionate presence;Narrative/life review;Explored values/beliefs/practices/strengths;Encouragement  Intervention Outcomes  Outcomes Connection to spiritual care;Awareness of support  Spiritual Care Plan  Spiritual Care Issues Still Outstanding Chaplain will continue to follow   Reason for Visit: Chaplain identified Pt on the schedule as a Pt I had not connected with yet and visited to deliver introduction to Spiritual Care  Description of Visit: I found Kemora sitting in the recliner receiving her treatment. No support person was present at this time.  I introduced myself as the chaplain for the cancer center and offered a brief education on the role of a chaplain and the support we can offer to our patients, caregivers, and staff.    Zlaty was willing to speak with me and we immediate engaged in life review/storytelling.  She shared her relatively new journey with cancer.  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  She stated "it knocked the wind out of my sails for a little while."  Airiel is highly motivated to fight as she is the single mother of her son (her husband passed away when child was 5).  She worries about what will happen to him if she were to pass.  We spoke some about how he is coping, as  well.  In addition to [employer], fighting mentality, she is support by her SO, Clotilda, who is her main caregiver.  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  Mardene's son Devaughn has been homeschooled his entire life but is having to start a new private school this fall due to [place] struggles.  This is a source of concern for them, and I will check in with them on how things are going.  Plan of Care: Nayelis appears to have good home support at this time and appropriate coping skills.  I will continue to follow up with her on a bi-weekly basis for now.  [name], MDiv  Chaplain, [facility] [email or web address] [phone]

## 2024-02-27 NOTE — Progress Notes (Signed)
 DISCONTINUE ON PATHWAY REGIMEN - Non-Small Cell Lung     A cycle is every 21 days:     Pembrolizumab    **Always confirm dose/schedule in your pharmacy ordering system**  PRIOR TREATMENT: [id number]: Pembrolizumab  200 mg q21 Days Until Disease Progression, Unacceptable Toxicity, or up to 24 Months  START ON PATHWAY REGIMEN - Non-Small Cell Lung     A cycle is every 21 days:     Pembrolizumab       Pemetrexed       Carboplatin    **Always confirm dose/schedule in your pharmacy ordering system**  Patient Characteristics: Stage IV Metastatic, Nonsquamous, Molecular Analysis Completed, Molecular Alteration Present and Targeted Therapy Exhausted OR KRAS G12C+ or HER2+ or NRG1+ or c-Met Present and No Prior Chemo/Immunotherapy OR No Alteration Present, Initial  Chemotherapy/Immunotherapy, PS = 0, 1, No Alteration Present, No Alteration Present, Candidate for Immunotherapy, PD-L1 Expression Positive  ? 50% (TPS) and Immunotherapy Candidate Therapeutic Status: Stage IV Metastatic Histology: Nonsquamous Cell Broad Molecular Profiling Status: Animal nutritionist Analysis Results: No Alteration Present ECOG Performance Status: 0 Chemotherapy/Immunotherapy Line of Therapy: Initial Chemotherapy/Immunotherapy EGFR Exons 18-21 Mutation Testing Status: Completed and Negative c-Met Overexpression (EGFR Wildtype) Testing Status: Completed and Negative ALK Fusion/Rearrangement Testing Status: Completed and Negative BRAF V600 Mutation Testing Status: Completed and Negative KRAS G12C Mutation Testing Status: Completed and Negative MET Exon 14 Mutation Testing Status: Completed and Negative RET Fusion/Rearrangement Testing Status: Completed and Negative NRG1 Fusion/Rearrangement Testing Status: Completed and Negative HER2 Mutation Testing Status: Completed and Negative NTRK Fusion/Rearrangement Testing Status: Completed and Negative ROS1 Fusion/Rearrangement Testing Status: Completed and  Negative Immunotherapy Candidate Status: Candidate for Immunotherapy PD-L1 Expression Status: PD-L1 Positive ? 50% (TPS) Intent of Therapy: Non-Curative / Palliative Intent, Discussed with Patient

## 2024-02-27 NOTE — Progress Notes (Signed)
 Patient okay for Keytruda  today and will receive alimta  and Carboplatin  later this week, per Dr. Davonna. Patient tolerated B12 injection with no complaints voiced. Site clean and dry with no bruising or swelling noted at site. See MAR for details. Band aid applied.  Patient stable during and after injection.  Patient tolerated therapy with no complaints voiced. Side effects with management reviewed with understanding verbalized. Port site clean and dry with no bruising or swelling noted at site. Good blood return noted before and after administration of therapy. Patient left in satisfactory condition with VSS and no s/s of distress noted.

## 2024-02-27 NOTE — Assessment & Plan Note (Signed)
 Recent PET scan with evidence of liver metastasis confirmed by MRI  - Discussed risk versus benefits of liver biopsy.  Patient does not want to do liver biopsy at this time.  Stated that she would consider at a later point if she does not respond to treatment. - Will continue to monitor this on PET scan

## 2024-02-28 ENCOUNTER — Inpatient Hospital Stay

## 2024-02-28 VITALS — BP 113/70 | HR 65 | Temp 97.3°F | Resp 18

## 2024-02-28 DIAGNOSIS — Z79620 Long term (current) use of immunosuppressive biologic: Secondary | ICD-10-CM | POA: Diagnosis not present

## 2024-02-28 DIAGNOSIS — C787 Secondary malignant neoplasm of liver and intrahepatic bile duct: Secondary | ICD-10-CM | POA: Diagnosis not present

## 2024-02-28 DIAGNOSIS — Z5111 Encounter for antineoplastic chemotherapy: Secondary | ICD-10-CM | POA: Diagnosis not present

## 2024-02-28 DIAGNOSIS — C3411 Malignant neoplasm of upper lobe, right bronchus or lung: Secondary | ICD-10-CM | POA: Diagnosis not present

## 2024-02-28 DIAGNOSIS — F17200 Nicotine dependence, unspecified, uncomplicated: Secondary | ICD-10-CM | POA: Diagnosis not present

## 2024-02-28 DIAGNOSIS — C7931 Secondary malignant neoplasm of brain: Secondary | ICD-10-CM | POA: Diagnosis not present

## 2024-02-28 DIAGNOSIS — Z5112 Encounter for antineoplastic immunotherapy: Secondary | ICD-10-CM | POA: Diagnosis not present

## 2024-02-28 DIAGNOSIS — C7801 Secondary malignant neoplasm of right lung: Secondary | ICD-10-CM

## 2024-02-28 LAB — T4: T4, Total: 8.1 ug/dL (ref 4.5–12.0)

## 2024-02-28 MED ORDER — PALONOSETRON HCL INJECTION 0.25 MG/5ML
0.2500 mg | Freq: Once | INTRAVENOUS | Status: AC
Start: 2024-02-28 — End: 2024-02-28
  Administered 2024-02-28: 0.25 mg via INTRAVENOUS
  Filled 2024-02-28: qty 5

## 2024-02-28 MED ORDER — SODIUM CHLORIDE 0.9 % IV SOLN
150.0000 mg | Freq: Once | INTRAVENOUS | Status: AC
  Administered 2024-02-28: 150 mg via INTRAVENOUS
  Filled 2024-02-28: qty 5

## 2024-02-28 MED ORDER — SODIUM CHLORIDE 0.9 % IV SOLN: INTRAVENOUS | Status: DC

## 2024-02-28 MED ORDER — SODIUM CHLORIDE 0.9 % IV SOLN
500.0000 mg/m2 | Freq: Once | INTRAVENOUS | Status: AC
  Administered 2024-02-28: 900 mg via INTRAVENOUS
  Filled 2024-02-28: qty 20

## 2024-02-28 MED ORDER — SODIUM CHLORIDE 0.9 % IV SOLN
572.5000 mg | Freq: Once | INTRAVENOUS | Status: AC
  Administered 2024-02-28: 570 mg via INTRAVENOUS
  Filled 2024-02-28: qty 57

## 2024-02-28 MED ORDER — DEXAMETHASONE SODIUM PHOSPHATE 10 MG/ML IJ SOLN
10.0000 mg | Freq: Once | INTRAMUSCULAR | Status: AC
Start: 2024-02-28 — End: 2024-02-28
  Administered 2024-02-28: 10 mg via INTRAVENOUS
  Filled 2024-02-28: qty 1

## 2024-02-28 NOTE — Patient Instructions (Signed)
 [facility] [place] - A DEPT OF [facility]  Discharge Instructions: Thank you for choosing [facility] to provide your oncology and hematology care.  If you have a lab appointment with the Cancer Center - please note that after April 8th, 2024, all labs will be drawn in the cancer center.  You do not have to check in or register with the main entrance as you have in the past but will complete your check-in in the cancer center.  Wear comfortable clothing and clothing appropriate for easy access to any Portacath or PICC line.   We strive to give you quality time with your provider. You may need to reschedule your appointment if you arrive late (15 or more minutes).  Arriving late affects you and other patients whose appointments are after yours.  Also, if you miss three or more appointments without notifying the office, you may be dismissed from the clinic at the provider's discretion.      For prescription refill requests, have your pharmacy contact our office and allow 72 hours for refills to be completed.    Today you received the following chemotherapy and/or immunotherapy agents Alimta , return as scheduled.   To help prevent nausea and vomiting after your treatment, we encourage you to take your nausea medication as directed.  BELOW ARE SYMPTOMS THAT SHOULD BE REPORTED IMMEDIATELY: *FEVER GREATER THAN 100.4 F (38 C) OR HIGHER *CHILLS OR SWEATING *NAUSEA AND VOMITING THAT IS NOT CONTROLLED WITH YOUR NAUSEA MEDICATION *UNUSUAL SHORTNESS OF BREATH *UNUSUAL BRUISING OR BLEEDING *URINARY PROBLEMS (pain or burning when urinating, or frequent urination) *BOWEL PROBLEMS (unusual diarrhea, constipation, pain near the anus) TENDERNESS IN MOUTH AND THROAT WITH OR WITHOUT PRESENCE OF ULCERS (sore throat, sores in mouth, or a toothache) UNUSUAL RASH, SWELLING OR PAIN  UNUSUAL VAGINAL DISCHARGE OR ITCHING   Items with * indicate a potential emergency and  should be followed up as soon as possible or go to the Emergency Department if any problems should occur.  Please show the CHEMOTHERAPY ALERT CARD or IMMUNOTHERAPY ALERT CARD at check-in to the Emergency Department and triage nurse.  Should you have questions after your visit or need to cancel or reschedule your appointment, please contact [facility] [place] - A DEPT OF [facility] [phone]  and follow the prompts.  Office hours are 8:00 a.m. to 4:30 p.m. Monday - Friday. Please note that voicemails left after 4:00 p.m. may not be returned until the following business day.  We are closed weekends and major holidays. You have access to a nurse at all times for urgent questions. Please call the main number to the clinic [phone] and follow the prompts.  For any non-urgent questions, you may also contact your provider using MyChart. We now offer e-Visits for anyone 73 and older to request care online for non-urgent symptoms. For details visit [email or web address].   Also download the MyChart app! Go to the app store, search "MyChart", open the app, select [facility], and log in with your MyChart username and password.

## 2024-02-28 NOTE — Progress Notes (Signed)
 Patient presents today 24 hr after Keytruda , no complaints from patient. Patient tolerated chemotherapy with no complaints voiced. Side effects with management reviewed understanding verbalized. Port site clean and dry with no bruising or swelling noted at site. Good blood return noted before and after administration of chemotherapy. Band aid applied. Patient left in satisfactory condition with VSS and no s/s of distress noted.

## 2024-02-29 ENCOUNTER — Encounter: Payer: Self-pay | Admitting: Oncology

## 2024-03-01 ENCOUNTER — Telehealth: Payer: Self-pay

## 2024-03-01 NOTE — Telephone Encounter (Signed)
 Patient called for 24-hour follow-up, patient reports that she feels good. She has had some burning in the back of her throat that is tolerable. Patient states she will call back if needed.

## 2024-03-05 ENCOUNTER — Other Ambulatory Visit: Payer: Self-pay | Admitting: *Deleted

## 2024-03-05 ENCOUNTER — Encounter: Payer: Self-pay | Admitting: Oncology

## 2024-03-11 ENCOUNTER — Inpatient Hospital Stay: Admitting: Licensed Clinical Social Worker

## 2024-03-11 DIAGNOSIS — C7801 Secondary malignant neoplasm of right lung: Secondary | ICD-10-CM

## 2024-03-12 ENCOUNTER — Encounter: Payer: Self-pay | Admitting: Oncology

## 2024-03-12 NOTE — Progress Notes (Signed)
 [facility] CSW Progress Note  Visual merchandiser met with pt over video to follow-up on disability concerns.    Interventions: CSW spoke with pt regarding the referral process to the [facility] to start the application process for SSDI.  Future planning discussed at length.  Advanced directives also discussed.  CSW emailed a copy of the [employer] referral, advanced directives packet and flyer for the [facility] to pt.      Follow Up Plan:  CSW to see pt in infusion next week to further discuss.      Julie JONELLE Manna, LCSW Clinical Social Worker [facility]

## 2024-03-19 NOTE — Progress Notes (Signed)
 Patient Care Team: Zollie Lowers, MD as PCP - General (Family Medicine) Dolphus Reiter, MD as Consulting Physician (Rheumatology) Davonna Siad, MD as Medical Oncologist (Medical Oncology) Celestia Joesph SQUIBB, RN as Oncology Nurse Navigator (Medical Oncology) Tamea Dedra CROME, MD as Consulting Physician (Pulmonary Disease) Darlean Ozell NOVAK, MD as Consulting Physician (Pulmonary Disease)  Clinic Day:  03/20/2024  Referring physician: Zollie Lowers, MD   CHIEF COMPLAINT:  CC: Metastatic lung adenocarcinoma   ASSESSMENT & PLAN:   Assessment & Plan: Julie Horn  is a 48 y.o. 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   S/p cerebellar lesion resection consistent with lung primary.   Oncology history as below. PET scan showed limited disease to lung and lymph nodes.  Discussed at tumor board, surgery is not a feasible option secondary to lymph node involvement.  Recommended chemoradiation. Caris: PD-L1: 100% Completed chemoRT on 01/08/2024 Recent MRI brain with new sites of metastasis and CT scan within evidence of new liver lesion with response locally in the lung. MRI liver with confirmation of liver metastasis. Discussion at the thoracic tumor board, and started on chemoimmunotherapy on 02/28/2024-carboplatin , pemetrexed  and pembrolizumab .    -Cycle 2-day 1 today.  Patient tolerated previous cycle well with no significant side effects - Labs reviewed today:CMP: WNL, CBC: WBC: 1.9, ANC: 1000, hemoglobin: 12.5, platelets: 144 -Physical exam stable.  Okay to continue with treatment today -Considering patient has low ANC, will add growth factor to the cycle and further cycles. - Will obtain a PET scan 2 weeks after completing chemotherapy I.e 4 cycles. - Educated patient on reaching out if she has rash, shortness  of breath, abdominal pain, diarrhea that persists -We will put her neck cycle of chemotherapy by a few days to accommodate for her wedding. -Will repeat TSH and T4 every 6 weeks  Return to clinic before next cycle of chemoimmunotherapy. Metastasis to brain [facility]) Cerebellar lesion s/p resection consistent with the poorly differentiated adenocarcinoma of lung primary. Patient completed gamma knife at [facility].  Currently on a clinical trial assessing gamma knife versus gamma tile Completed steroid taper. Recent MRI brain with multiple new brain metastasis.  Completed SBRT at [facility]  - Continue to follow with [facility]. - Patient reports headaches that are only manageable with Fioricet.   - Next MRI brain scheduled for October 1st  Metastasis to liver [facility]) Recent PET scan with evidence of liver metastasis confirmed by MRI  - Discussed risk versus benefits of liver biopsy.  Patient does not want to do liver biopsy at this time.  Stated that she would consider at a later point if she does not respond to treatment. - Will continue to monitor this on PET scan  Nicotine dependence with current use Patient quit smoking since a diagnosis of lung cancer  - Encouraged to abstain from smoking Chemotherapy-induced neutropenia (HCC) Secondary to myelosuppression from chemotherapy  - Will add growth factors with this cycle and further cycles - Continue to monitor - Discussed with the patient that she has to go to the ER with a fever of 100.4 or more Chronic nonintractable headache, unspecified headache type Patient has chronic headaches that occur at frequency of twice a week. She is taking Fioricet right now  -Patient inquired about taking ibuprofen as that is only thing that - Patient has normal kidney function.  Can take ibuprofen if it is just  1 dose twice a week - Recommended to hydrate very well - Continue Fioricet    The patient understands the plans discussed today and is in agreement with them.   She knows to contact our office if she develops concerns prior to her next appointment.  I provided 30 minutes of face-to-face time during this encounter and > 50% was spent counseling as documented under my assessment and plan.    LILLETTE Verneta SAUNDERS Teague,acting as a Neurosurgeon for Mickiel Dry, MD.,have documented all relevant documentation on the behalf of Mickiel Dry, MD,as directed by  Mickiel Dry, MD while in the presence of Mickiel Dry, MD.  I, Mickiel Dry MD, have reviewed the above documentation for accuracy and completeness, and I agree with the above.     Mickiel Dry, MD   CANCER CENTER [facility] CANCER CTR [place] - A DEPT OF [facility] [address] [place] [address] Dept: [phone] Dept Fax: [phone]      ONCOLOGY HISTORY:   Oncology History  Metastatic lung carcinoma, right (HCC)  09/25/2023 Imaging   CT CAP w contrast:  1. Spiculated mass in the posterior right pulmonary apex measuring 2.6 x 1.4 cm, consistent with primary bronchogenic malignancy. 2. Enlarged right hilar lymph node measuring 1.5 x 1.2 cm, consistent with nodal metastatic disease. 3. Tiny hypodensities in the central liver, incompletely characterized, although statistically likely benign cysts or hemangiomas. Small metastases not strictly excluded. 4. Soft tissue nodule within the right gluteal subcutaneous fat;given location most likely an injection granuloma, however subcutaneous soft tissue metastases are occasionally seen in metastatic lung malignancy. Attention on follow-up.     09/25/2023 Imaging   MRI brain w wo contrast:  1. Heterogeneously contrast-enhancing mass of the right cerebellar hemisphere with a large amount of surrounding vasogenic edema. Metastasis and hemangioblastoma are the most common infratentorial tumors in adults. Astrocytoma is another possibility. 2. Mass effect on the fourth ventricle without hydrocephalus.    09/27/2023 Initial Diagnosis   Metastatic lung carcinoma, right (HCC)   10/05/2023 PET scan   IMPRESSION: 1. Hypermetabolic RIGHT upper lobe lung nodule consistent primary bronchogenic carcinoma. 2. Mildly hypermetabolic RIGHT lower paratracheal and RIGHT hilar lymph nodes are concerning for nodal metastasis. No contralateral hypermetabolic lymph nodes. 3. No evidence of distant metastatic disease. 4. Intense activity through the distal anal canal is favored physiologic. Recommend digital rectal exam.   10/11/2023 Procedure   Resection of cerebellar metastasis   10/11/2023 Pathology Results   Diagnosis   A: Brain tumor, biopsy - Metastatic poorly differentiated adenocarcinoma consistent with lung primary.   B: Brain tumor, right, resection - Metastatic poorly differentiated adenocarcinoma consistent with lung primary.   C: Brain tumor, right, specimen trap - Metastatic poorly differentiated adenocarcinoma consistent with lung primary.     10/26/2023 Imaging   MRI brain:  Status post right occipital craniotomy for mass resection. Marked reduction in edema and mass effect, with a now normal appearance of the fourth ventricle. Blood products are present in the region of the approach and resection, with a small amount of adjacent enhancement as would be expected at this time. The study is indeterminate for residual marginal tumor and additional follow-up is warranted.   10/27/2023 Pathology Results   Caris:  PD-L1: Positive, TPS: 100%, 3+ TMB: High, 15 mutation per MB  ALK, EGFR, KRAS, MET, RET, ROS1: Negative   11/03/2023 Procedure   Port placement   11/12/2023 - 11/17/2023 Radiation Therapy   5 fractions of SBRT  for R cerebellar bed   11/27/2023 - 01/01/2024 Chemotherapy   Patient is on Treatment Plan : LUNG Carboplatin  + Paclitaxel  + XRT q7d     02/05/2024 Imaging   MRI brain with and without contrast:  Impression  -Postsurgical changes of right cerebellar mass resection with  further collapse of the resection cavity. Decreasing peripheral enhancement of the resection cavity is favored postsurgical. -Multiple new enhancing supratentorial intracranial lesions and new nodular leptomeningeal enhancement in the left occipital lobe, concerning for worsening metastases. -- Questioned enhancement in the right greater than left internal auditory canals, potentially additional sites of leptomeningeal disease.   02/07/2024 Imaging   CT CAP with contrast:  IMPRESSION: 1. New 10 mm subcapsular low-density lesion in the anterior left liver. 2. Spiculated pulmonary nodule posterior right upper lobe is slightly decreased in size in the interval. 3. Right hilar lymph node measured previously at 12 mm short axis is 6 mm short axis today. No mediastinal lymphadenopathy by CT size criteria.   02/16/2024 Imaging   MRI liver with and without contrast:  IMPRESSION: 1.4 cm rim-enhancing mass in segment 4B of the left hepatic lobe, highly suspicious for liver metastasis.   02/23/2024 -  Radiation Therapy   SBRT to the new brain metastasis   02/26/2024 - 02/26/2024 Chemotherapy   Patient is on Treatment Plan : LUNG NSCLC Pembrolizumab  (200) q21d     02/27/2024 -  Chemotherapy   Patient is on Treatment Plan : LUNG Carboplatin  (5) + Pemetrexed  (500) + Pembrolizumab  (200) D1 q21d Induction x 4 cycles / Maintenance Pemetrexed  (500) + Pembrolizumab  (200) D1 q21d         Current Treatment: Carboplatin , pemetrexed  and pembrolizumab   INTERVAL HISTORY:  Anastaisa Wooding is here today for follow up.    She experiences headaches a couple of times a week, describing them as 'nasty' w around the occipital area and radiates. Fioricet provides some relief, but is ineffective during severe episodes. Previously, she combined Fioricet with ibuprofen, but due to her current treatment with Alimta , she was advised against using NSAIDs. Tylenol  has not been effective for her headaches.  Socially, she is  planning to get married soon and is considering reducing her work commitments to focus on her health. She plans to have a small wedding ceremony at [employer] and a honeymoon in [place].  I have reviewed the past medical history, past surgical history, social history and family history with the patient and they are unchanged from previous note.  ALLERGIES:  is allergic to levofloxacin, sulfa antibiotics, azithromycin, doxycycline , latex, and wound dressing adhesive.  MEDICATIONS:  Current Outpatient Medications  Medication Sig Dispense Refill   ondansetron  (ZOFRAN -ODT) 4 MG disintegrating tablet Take 4 mg by mouth every 8 (eight) hours as needed.     acetaminophen  (TYLENOL ) 500 MG tablet Take 1,000 mg by mouth every 6 (six) hours as needed for mild pain (pain score 1-3). Take 2 tablets (1,000 mg total) by mouth every six (6) hours.     Albuterol -Budesonide  (AIRSUPRA ) 90-80 MCG/ACT AERO Inhale 2 puffs into the lungs every 4 (four) hours as needed. 10.7 g 11   ascorbic acid (VITAMIN C) 500 MG tablet Take 500 mg by mouth daily. Take 1 tablet (500 mg total) by mouth daily     butalbital -acetaminophen -caffeine  (FIORICET) 50-325-40 MG tablet Take 1 tablet by mouth every 4 (four) hours as needed for headache. 14 tablet 1   co-enzyme Q-10 30 MG capsule Take 30 mg by mouth 3 (three) times  daily. Take 1 capsule (30 mg total) by mouth Three (3) times a day.     Cyanocobalamin  (VITAMIN B 12) 500 MCG TABS Take 500 mg by mouth daily.     dexamethasone  (DECADRON ) 4 MG tablet Take twice daily for 3 days following each treatment 30 tablet 0   folic acid  (FOLVITE ) 1 MG tablet Take 1 tablet (1 mg total) by mouth daily. 30 tablet 6   guaiFENesin  (MUCINEX ) 600 MG 12 hr tablet Take 1,200 mg by mouth 2 (two) times daily. Take 1 tablet (1200 mg total) by mouth every twelve (12) hours.     hydrOXYzine  (VISTARIL ) 25 MG capsule Take 1 capsule (25 mg total) by mouth 3 (three) times daily. For anxiety and sleep. 90 capsule 5    ibuprofen (ADVIL) 100 MG tablet Take 200-400 mg by mouth every 6 (six) hours as needed for pain.     magnesium oxide (MAG-OX) 400 (240 Mg) MG tablet Take 400 mg by mouth daily. Take 1 tablet (400 mg total) by mouth daily.     melatonin 1 MG TABS tablet Take 1 mg by mouth at bedtime. Take 5 tablets (5 mg total) by mouth nightly.     prochlorperazine  (COMPAZINE ) 10 MG tablet Take 10 mg by mouth every 6 (six) hours as needed for nausea or vomiting.     sennosides-docusate sodium (SENOKOT-S) 8.6-50 MG tablet Take 2 tablets by mouth daily.     traZODone  (DESYREL ) 150 MG tablet USE FROM 1/3 TO 1 TABLET NIGHTLY AS NEEDED FOR SLEEP. 90 tablet 1   No current facility-administered medications for this visit.   Facility-Administered Medications Ordered in Other Visits  Medication Dose Route Frequency Provider Last Rate Last Admin   0.9 %  sodium chloride  infusion   Intravenous Continuous Trinia Georgi, MD 10 mL/hr at 03/20/24 0925 New Bag at 03/20/24 0925   CARBOplatin  (PARAPLATIN ) 570 mg in sodium chloride  0.9 % 250 mL chemo infusion  570 mg Intravenous Once Valarie Farace, MD       fosaprepitant  (EMEND) 150 mg in sodium chloride  0.9 % 145 mL IVPB  150 mg Intravenous Once Guy Seese, MD       pembrolizumab  (KEYTRUDA ) 200 mg in sodium chloride  0.9 % 50 mL chemo infusion  200 mg Intravenous Once Aarush Stukey, MD       PEMEtrexed  Disodium (ALIMTA ) 900 mg in sodium chloride  0.9 % 100 mL chemo infusion  500 mg/m2 (Treatment Plan Recorded) Intravenous Once Derrell Milanes, MD        REVIEW OF SYSTEMS:   Constitutional: Denies fevers, chills or abnormal weight loss Eyes: Denies blurriness of vision Ears, nose, mouth, throat, and face: Denies mucositis or sore throat Respiratory: Denies cough, dyspnea or wheezes Cardiovascular: Denies palpitation, chest discomfort or lower extremity swelling Gastrointestinal:  Denies nausea, heartburn or change in bowel habits Skin: Denies abnormal skin  rashes Lymphatics: Denies new lymphadenopathy or easy bruising Neurological:Denies numbness, tingling or new weaknesses Behavioral/Psych: Mood is stable, no new changes  All other systems were reviewed with the patient and are negative.   VITALS:  Last menstrual period 07/26/2019.  Wt Readings from Last 3 Encounters:  03/20/24 138 lb 12.8 oz (63 kg)  02/27/24 142 lb 4.8 oz (64.5 kg)  02/14/24 139 lb (63 kg)    There is no height or weight on file to calculate BMI.  Performance status (ECOG): 0 - Asymptomatic  PHYSICAL EXAM:   GENERAL:alert, no distress and comfortable SKIN: Port insertion surgical site clean.  LUNGS:  clear to auscultation and percussion with normal breathing effort HEART: regular rate & rhythm and no murmurs and no lower extremity edema ABDOMEN:abdomen soft, non-tender and normal bowel sounds Musculoskeletal:no cyanosis of digits and no clubbing  NEURO: alert & oriented x 3 with fluent speech.  Postsurgical scar on the right occipital region-healed well with new hair growth.  LABORATORY DATA:  I have reviewed the data as listed  Lab Results  Component Value Date   WBC 1.9 (L) 03/20/2024   NEUTROABS 1.0 (L) 03/20/2024   HGB 12.5 03/20/2024   HCT 35.5 (L) 03/20/2024   MCV 96.5 03/20/2024   PLT 144 (L) 03/20/2024    Latest Reference Range & Units 03/20/24 08:10  Neutrophils % 52  Lymphocytes % 23  Monocytes Relative % 20  Eosinophil % 4  Basophil % 1  Immature Granulocytes % 0  NEUT# 1.7 - 7.7 K/uL 1.0 (L)  Lymphs Abs 0.7 - 4.0 K/uL 0.4 (L)  Monocyte # 0.1 - 1.0 K/uL 0.4  Eosinophils Absolute 0.0 - 0.5 K/uL 0.1  Basophils Absolute 0.0 - 0.1 K/uL 0.0  Abs Immature Granulocytes 0.00 - 0.07 K/uL 0.00  (L): Data is abnormally low   Chemistry      Component Value Date/Time   NA 139 03/20/2024 0810   NA 145 (H) 05/22/2023 1054   K 3.6 03/20/2024 0810   CL 104 03/20/2024 0810   CO2 25 03/20/2024 0810   BUN 11 03/20/2024 0810   BUN 7 05/22/2023  1054   CREATININE 0.52 03/20/2024 0810   GLU 83 08/25/2015 0000      Component Value Date/Time   CALCIUM 9.4 03/20/2024 0810   ALKPHOS 62 03/20/2024 0810   AST 20 03/20/2024 0810   ALT 24 03/20/2024 0810   BILITOT 0.6 03/20/2024 0810   BILITOT 0.5 05/22/2023 1054      Latest Reference Range & Units 02/27/24 08:53  TSH 0.350 - 4.500 uIU/mL 2.645  Thyroxine (T4) 4.5 - 12.0 ug/dL 8.1    RADIOGRAPHIC STUDIES: I reviewed the following image findings and agree with the report  None new to review

## 2024-03-20 ENCOUNTER — Inpatient Hospital Stay

## 2024-03-20 ENCOUNTER — Inpatient Hospital Stay: Admitting: Licensed Clinical Social Worker

## 2024-03-20 ENCOUNTER — Inpatient Hospital Stay (HOSPITAL_BASED_OUTPATIENT_CLINIC_OR_DEPARTMENT_OTHER): Admitting: Oncology

## 2024-03-20 VITALS — BP 111/73 | HR 80 | Temp 98.0°F | Resp 18

## 2024-03-20 DIAGNOSIS — F17200 Nicotine dependence, unspecified, uncomplicated: Secondary | ICD-10-CM

## 2024-03-20 DIAGNOSIS — C3411 Malignant neoplasm of upper lobe, right bronchus or lung: Secondary | ICD-10-CM | POA: Diagnosis not present

## 2024-03-20 DIAGNOSIS — C7801 Secondary malignant neoplasm of right lung: Secondary | ICD-10-CM

## 2024-03-20 DIAGNOSIS — R519 Other chronic pain: Secondary | ICD-10-CM | POA: Insufficient documentation

## 2024-03-20 DIAGNOSIS — Z5112 Encounter for antineoplastic immunotherapy: Secondary | ICD-10-CM | POA: Diagnosis not present

## 2024-03-20 DIAGNOSIS — D701 Agranulocytosis secondary to cancer chemotherapy: Secondary | ICD-10-CM

## 2024-03-20 DIAGNOSIS — T451X5A Adverse effect of antineoplastic and immunosuppressive drugs, initial encounter: Secondary | ICD-10-CM

## 2024-03-20 DIAGNOSIS — D702 Other drug-induced agranulocytosis: Secondary | ICD-10-CM | POA: Insufficient documentation

## 2024-03-20 DIAGNOSIS — D709 Neutropenia, unspecified: Secondary | ICD-10-CM | POA: Insufficient documentation

## 2024-03-20 DIAGNOSIS — C787 Secondary malignant neoplasm of liver and intrahepatic bile duct: Secondary | ICD-10-CM

## 2024-03-20 DIAGNOSIS — C7931 Secondary malignant neoplasm of brain: Secondary | ICD-10-CM

## 2024-03-20 DIAGNOSIS — Z79620 Long term (current) use of immunosuppressive biologic: Secondary | ICD-10-CM | POA: Diagnosis not present

## 2024-03-20 DIAGNOSIS — G8929 Other chronic pain: Secondary | ICD-10-CM

## 2024-03-20 DIAGNOSIS — Z5111 Encounter for antineoplastic chemotherapy: Secondary | ICD-10-CM | POA: Diagnosis not present

## 2024-03-20 LAB — CBC WITH DIFFERENTIAL/PLATELET
Abs Immature Granulocytes: 0 K/uL (ref 0.00–0.07)
Basophils Absolute: 0 K/uL (ref 0.0–0.1)
Basophils Relative: 1 %
Eosinophils Absolute: 0.1 K/uL (ref 0.0–0.5)
Eosinophils Relative: 4 %
HCT: 35.5 % — ABNORMAL LOW (ref 36.0–46.0)
Hemoglobin: 12.5 g/dL (ref 12.0–15.0)
Immature Granulocytes: 0 %
Lymphocytes Relative: 23 %
Lymphs Abs: 0.4 K/uL — ABNORMAL LOW (ref 0.7–4.0)
MCH: 34 pg (ref 26.0–34.0)
MCHC: 35.2 g/dL (ref 30.0–36.0)
MCV: 96.5 fL (ref 80.0–100.0)
Monocytes Absolute: 0.4 K/uL (ref 0.1–1.0)
Monocytes Relative: 20 %
Neutro Abs: 1 K/uL — ABNORMAL LOW (ref 1.7–7.7)
Neutrophils Relative %: 52 %
Platelets: 144 K/uL — ABNORMAL LOW (ref 150–400)
RBC: 3.68 MIL/uL — ABNORMAL LOW (ref 3.87–5.11)
RDW: 12.2 % (ref 11.5–15.5)
WBC: 1.9 K/uL — ABNORMAL LOW (ref 4.0–10.5)
nRBC: 0 % (ref 0.0–0.2)

## 2024-03-20 LAB — COMPREHENSIVE METABOLIC PANEL WITH GFR
ALT: 24 U/L (ref 0–44)
AST: 20 U/L (ref 15–41)
Albumin: 3.8 g/dL (ref 3.5–5.0)
Alkaline Phosphatase: 62 U/L (ref 38–126)
Anion gap: 10 (ref 5–15)
BUN: 11 mg/dL (ref 6–20)
CO2: 25 mmol/L (ref 22–32)
Calcium: 9.4 mg/dL (ref 8.9–10.3)
Chloride: 104 mmol/L (ref 98–111)
Creatinine, Ser: 0.52 mg/dL (ref 0.44–1.00)
GFR, Estimated: >60 mL/min (ref >60)
Glucose, Bld: 98 mg/dL (ref 70–99)
Potassium: 3.6 mmol/L (ref 3.5–5.1)
Sodium: 139 mmol/L (ref 135–145)
Total Bilirubin: 0.6 mg/dL (ref 0.0–1.2)
Total Protein: 6.8 g/dL (ref 6.5–8.1)

## 2024-03-20 LAB — MAGNESIUM: Magnesium: 2.1 mg/dL (ref 1.7–2.4)

## 2024-03-20 MED ORDER — SODIUM CHLORIDE 0.9 % IV SOLN
150.0000 mg | Freq: Once | INTRAVENOUS | Status: AC
  Administered 2024-03-20: 150 mg via INTRAVENOUS
  Filled 2024-03-20: qty 150

## 2024-03-20 MED ORDER — DEXAMETHASONE SODIUM PHOSPHATE 10 MG/ML IJ SOLN
10.0000 mg | Freq: Once | INTRAMUSCULAR | Status: AC
  Administered 2024-03-20: 10 mg via INTRAVENOUS
  Filled 2024-03-20: qty 1

## 2024-03-20 MED ORDER — SODIUM CHLORIDE 0.9 % IV SOLN
500.0000 mg/m2 | Freq: Once | INTRAVENOUS | Status: AC
  Administered 2024-03-20: 900 mg via INTRAVENOUS
  Filled 2024-03-20: qty 20

## 2024-03-20 MED ORDER — PALONOSETRON HCL INJECTION 0.25 MG/5ML
0.2500 mg | Freq: Once | INTRAVENOUS | Status: AC
  Administered 2024-03-20: 0.25 mg via INTRAVENOUS
  Filled 2024-03-20: qty 5

## 2024-03-20 MED ORDER — SODIUM CHLORIDE 0.9 % IV SOLN
200.0000 mg | Freq: Once | INTRAVENOUS | Status: AC
  Administered 2024-03-20: 200 mg via INTRAVENOUS
  Filled 2024-03-20: qty 8

## 2024-03-20 MED ORDER — SODIUM CHLORIDE 0.9 % IV SOLN
567.5000 mg | Freq: Once | INTRAVENOUS | Status: AC
  Administered 2024-03-20: 570 mg via INTRAVENOUS
  Filled 2024-03-20: qty 57

## 2024-03-20 MED ORDER — SODIUM CHLORIDE 0.9 % IV SOLN
INTRAVENOUS | Status: DC
Start: 2024-03-20 — End: 2024-03-20

## 2024-03-20 NOTE — Progress Notes (Signed)
 Orders received to add Fulphila  6 mg subcutaneous x 1 to day 3 of treatment.  Updated as above.  V.O. Dr Ivery Molt, PharmD

## 2024-03-20 NOTE — Progress Notes (Signed)
 [facility] Healthcare Advance Directives Clinical Social Work  Patient presented to Advance Directives Clinic  to review and complete healthcare advance directives.  Clinical Social Worker met with patient.  The patient designated Clotilda Bateman [phone]) as their primary healthcare agent.  Patient also completed healthcare living will.    Documents were notarized and copies made for patient/family. Clinical Social Worker will send documents to medical records to be scanned into patient's chart. Clinical Social Worker encouraged patient/family to contact with any additional questions or concerns.   Devere JONELLE Manna, LCSW Clinical Social Worker [facility]

## 2024-03-20 NOTE — Progress Notes (Signed)
   03/20/24 1300  Spiritual Encounters  Type of Visit Follow up  Care provided to: Patient  Referral source Chaplain assessment  Reason for visit Routine spiritual support (Follow-up Visit)  OnCall Visit No  Spiritual Framework  Presenting Themes Meaning/purpose/sources of inspiration;Goals in life/care;Significant life change;Impactful experiences and emotions;Rituals and practive;Community and relationships  Community/Connection Family  Patient Stress Factors Exhausted;Major life changes  Interventions  Spiritual Care Interventions Made Compassionate presence;Reflective listening;Narrative/life review;Encouragement  Intervention Outcomes  Outcomes Awareness of support  Spiritual Care Plan  Spiritual Care Issues Still Outstanding Chaplain will continue to follow   Reason for Visit: Chaplain making scheduled follow-up with Pt I previously connected with. I was unable to connect with Pt while she was here in the clinic this morning, but I called her this afternoon and we connected by phone  Description of Visit: I opened the conversation asking about Daneshia's son, Devaughn, who was starting a new private school and she previously expressed some concern regarding how he might cope/adjust to the new situation.  She reported that he was doing very well and she is proud of him  Continuing on in exploration, Ophie revealed that she is getting married next month to her hershall Kirsch.  She will be under his health insurance at this point and will be free to stop working at this point if she wants.  She is finding she needs more time for recovery as she gets tired more frequently.  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  This would give her an outlet for her creative energy while not draining her energy as work currently does.  She reports feeling well.  I celebrated her upcoming marriage with her, and promised  to check in after the honeymoon.  Plan of Care: I will continue to follow up with Shawndell at her next infusion.   [name], MDiv  Chaplain, [facility] [email or web address] [phone]

## 2024-03-20 NOTE — Patient Instructions (Signed)
 [facility] [place] - A DEPT OF [facility]  Discharge Instructions: Thank you for choosing [facility] to provide your oncology and hematology care.  If you have a lab appointment with the Cancer Center - please note that after April 8th, 2024, all labs will be drawn in the cancer center.  You do not have to check in or register with the main entrance as you have in the past but will complete your check-in in the cancer center.  Wear comfortable clothing and clothing appropriate for easy access to any Portacath or PICC line.   We strive to give you quality time with your provider. You may need to reschedule your appointment if you arrive late (15 or more minutes).  Arriving late affects you and other patients whose appointments are after yours.  Also, if you miss three or more appointments without notifying the office, you may be dismissed from the clinic at the provider's discretion.      For prescription refill requests, have your pharmacy contact our office and allow 72 hours for refills to be completed.    Today you received the following chemotherapy and/or immunotherapy agents Keytruda , Alimta  and Carboplatin , return as scheduled.  To help prevent nausea and vomiting after your treatment, we encourage you to take your nausea medication as directed.  BELOW ARE SYMPTOMS THAT SHOULD BE REPORTED IMMEDIATELY: *FEVER GREATER THAN 100.4 F (38 C) OR HIGHER *CHILLS OR SWEATING *NAUSEA AND VOMITING THAT IS NOT CONTROLLED WITH YOUR NAUSEA MEDICATION *UNUSUAL SHORTNESS OF BREATH *UNUSUAL BRUISING OR BLEEDING *URINARY PROBLEMS (pain or burning when urinating, or frequent urination) *BOWEL PROBLEMS (unusual diarrhea, constipation, pain near the anus) TENDERNESS IN MOUTH AND THROAT WITH OR WITHOUT PRESENCE OF ULCERS (sore throat, sores in mouth, or a toothache) UNUSUAL RASH, SWELLING OR PAIN  UNUSUAL VAGINAL DISCHARGE OR ITCHING   Items with * indicate a  potential emergency and should be followed up as soon as possible or go to the Emergency Department if any problems should occur.  Please show the CHEMOTHERAPY ALERT CARD or IMMUNOTHERAPY ALERT CARD at check-in to the Emergency Department and triage nurse.  Should you have questions after your visit or need to cancel or reschedule your appointment, please contact [facility] CANCER CTR [place] - A DEPT OF [facility] [phone]  and follow the prompts.  Office hours are 8:00 a.m. to 4:30 p.m. Monday - Friday. Please note that voicemails left after 4:00 p.m. may not be returned until the following business day.  We are closed weekends and major holidays. You have access to a nurse at all times for urgent questions. Please call the main number to the clinic [phone] and follow the prompts.  For any non-urgent questions, you may also contact your provider using MyChart. We now offer e-Visits for anyone 15 and older to request care online for non-urgent symptoms. For details visit [email or web address].   Also download the MyChart app! Go to the app store, search MyChart, open the app, select [place], and log in with your MyChart username and password.

## 2024-03-20 NOTE — Progress Notes (Signed)
 Patient okay for treatment today with ANC 1.0 per Dr. Davonna. Patient tolerated chemotherapy with no complaints voiced. Side effects with management reviewed understanding verbalized. Port site clean and dry with no bruising or swelling noted at site. Good blood return noted before and after administration of chemotherapy. Band aid applied. Patient left in satisfactory condition with VSS and no s/s of distress noted.

## 2024-03-20 NOTE — Assessment & Plan Note (Addendum)
 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   S/p cerebellar lesion resection consistent with lung primary.   Oncology history as below. PET scan showed limited disease to lung and lymph nodes.  Discussed at tumor board, surgery is not a feasible option secondary to lymph node involvement.  Recommended chemoradiation. Caris: PD-L1: 100% Completed chemoRT on 01/08/2024 Recent MRI brain with new sites of metastasis and CT scan within evidence of new liver lesion with response locally in the lung. MRI liver with confirmation of liver metastasis. Discussion at the thoracic tumor board, and started on chemoimmunotherapy on 02/28/2024-carboplatin , pemetrexed  and pembrolizumab .    -Cycle 2-day 1 today.  Patient tolerated previous cycle well with no significant side effects - Labs reviewed today:CMP: WNL, CBC: WBC: 1.9, ANC: 1000, hemoglobin: 12.5, platelets: 144 -Physical exam stable.  Okay to continue with treatment today -Considering patient has low ANC, will add growth factor to the cycle and further cycles. - Will obtain a PET scan 2 weeks after completing chemotherapy I.e 4 cycles. - Educated patient on reaching out if she has rash, shortness of breath, abdominal pain, diarrhea that persists -We will put her neck cycle of chemotherapy by a few days to accommodate for her wedding. -Will repeat TSH and T4 every 6 weeks  Return to clinic before next cycle of chemoimmunotherapy.

## 2024-03-20 NOTE — Assessment & Plan Note (Addendum)
 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 Patient completed gamma knife at [facility].  Currently on a clinical trial assessing gamma knife versus gamma tile Completed steroid taper. Recent MRI brain with multiple new brain metastasis.  Completed SBRT at [facility]  - Continue to follow with [facility]. - Patient reports headaches that are only manageable with Fioricet.   - Next MRI brain scheduled for October 1st

## 2024-03-20 NOTE — Progress Notes (Unsigned)
 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   S/p cerebellar lesion resection consistent with lung primary > RT and chemo   History of Present Illness  02/07/2024  Pulmonary/ 1st [facility] office eval/ [name] / [place] [facility] on no maint rx  Chief Complaint  Patient presents with   Follow-up    Transfer care from Dr. Tamea. She states that RT approx 4 wks ago. Her breathing may be worse slightly since the last visit.  She has minimal cough. She felt worse while on inhalers so stopped.   Dyspnea:  walking trail x 1.5 miles in half hour and having trouble with inclines  Cough: mostly with inhaler use if (symbicor 160 or trelegy) but not albuterol   Sleep: flat bed one pillow no resp cc  SABA use: once every few weeks  02: none  Rec AirSupra   1-2 puffs every 4 hours as needed (12 puffs in 24 hours)  Also  Ok to try albuterol  15 min before an activity (on alternating days)  that you know would usually make you short of breath  Please schedule a follow up office visit in 6 weeks, call sooner if needed with inhaler    03/21/2024  f/u ov/[place] office/[name] re: asthma/ lung ca maint on airsupra  did  bring inhaler also radiation at [facility] and chemo from [facility]  liver mets/ brain R temp Chief Complaint  Patient presents with   COPD    F/u 6 wk - mucus wont move up   Dyspnea:  does better with inclines if used before walk Cough: starts up p coffee  pnds on zyrtec  day 1  failed clariton Sleeping: bed is flat with one pillow s    resp cc  SABA use: avg  0 - 2 x  per day  02: none    No obvious day to day or daytime  variability or assoc excess/ purulent sputum or mucus plugs or hemoptysis or cp or chest tightness, subjective wheeze or overt  hb symptoms though coughs p coffee    Also denies any obvious fluctuation of symptoms with weather or environmental changes or other aggravating or alleviating factors except as outlined above   No unusual exposure hx or h/o childhood pna or knowledge of premature birth.  Current Allergies, Complete Past Medical History, Past Surgical History, Family History, and Social History were reviewed in [employer] record.  ROS  The following are not active complaints unless bolded Hoarseness, sore throat, dysphagia, dental problems, itching, sneezing,  nasal congestion or sense of discharge of excess mucus or purulent secretions, ear ache,   fever, chills, sweats, unintended wt loss or wt gain, classically pleuritic or exertional cp,  orthopnea pnd or arm/hand swelling  or leg swelling, presyncope, palpitations, abdominal pain, anorexia, nausea, vomiting, diarrhea  or change in bowel habits or change in bladder habits, change in stools or change in urine, dysuria, hematuria,  rash, arthralgias, visual complaints, headache, numbness, weakness or ataxia or problems with walking or coordination,  change in mood or  memory.  Current Meds  Medication Sig   acetaminophen  (TYLENOL ) 500 MG tablet Take 1,000 mg by mouth every 6 (six) hours as needed for mild pain (pain score 1-3). Take 2 tablets (1,000 mg total) by mouth every six (6) hours.   Albuterol -Budesonide  (AIRSUPRA ) 90-80 MCG/ACT AERO Inhale 2 puffs into the lungs every 4 (four) hours as needed.   ascorbic acid (VITAMIN C) 500 MG tablet Take 500 mg by mouth daily. Take 1 tablet (500 mg total) by mouth daily   butalbital -acetaminophen -caffeine  (FIORICET) 50-325-40 MG tablet Take 1 tablet by mouth every 4 (four) hours as needed for headache.   co-enzyme Q-10 30 MG capsule Take 30 mg by mouth 3 (three)  times daily. Take 1 capsule (30 mg total) by mouth Three (3) times a day.   Cyanocobalamin  (VITAMIN B 12) 500 MCG TABS Take 500 mg by mouth daily.   dexamethasone  (DECADRON ) 4 MG tablet Take twice daily for 3 days following each treatment   folic acid  (FOLVITE ) 1 MG tablet Take 1 tablet (1 mg total) by mouth daily.   guaiFENesin  (MUCINEX ) 600 MG 12 hr tablet Take 1,200 mg by mouth 2 (two) times daily. Take 1 tablet (1200 mg total) by mouth every twelve (12) hours.   hydrOXYzine  (VISTARIL ) 25 MG capsule Take 1 capsule (25 mg total) by mouth 3 (three) times daily. For anxiety and sleep.   ibuprofen (ADVIL) 100 MG tablet Take 200-400 mg by mouth every 6 (six) hours as needed for pain.   magnesium oxide (MAG-OX) 400 (240 Mg) MG tablet Take 400 mg by mouth daily. Take 1 tablet (400 mg total) by mouth daily.   melatonin 1 MG TABS tablet Take 1 mg by mouth at bedtime. Take 5 tablets (5 mg total) by mouth nightly.   ondansetron  (ZOFRAN -ODT) 4 MG disintegrating tablet Take 4 mg by mouth every 8 (eight) hours as needed.   prochlorperazine  (COMPAZINE ) 10 MG tablet Take 10 mg by mouth every 6 (six) hours as needed for nausea or vomiting.   sennosides-docusate sodium (SENOKOT-S) 8.6-50 MG tablet Take 2 tablets by mouth daily.   traZODone  (DESYREL ) 150 MG tablet USE FROM 1/3 TO 1 TABLET NIGHTLY AS NEEDED FOR SLEEP.            Past Medical History:  Diagnosis Date   Abnormal Pap smear of cervix 04/17/2018   Asthma    GERD (gastroesophageal reflux disease)    History of migraine 04/17/2018   Had been managed on Topamax but had side effect. Now not active.   Raynaud's disease    Spondylolisthesis of lumbar region 04/17/2018      Objective:     Wt Readings from Last 3 Encounters:  03/21/24 137 lb 12.8 oz (62.5 kg)  03/20/24 138 lb 12.8 oz (63 kg)  02/27/24 142 lb 4.8 oz (64.5 kg)   Vital signs reviewed  03/21/2024  - Note at rest 02 sats  100% on RA   General appearance:    amb wf nad    HEENT  : Oropharynx  nl      Nasal turbinates nl    NECK :  without  apparent JVD/ palpable Nodes/TM    LUNGS: no acc muscle use,  Nl contour chest which is clear to A and P bilaterally without cough on insp or exp maneuvers   CV:  RRR  no s3 or murmur or increase in P2, and no edema   ABD:  soft and nontender   MS:  Gait nl  ext warm without deformities Or obvious joint restrictions  calf tenderness, cyanosis or clubbing    SKIN: warm and dry without lesions    NEURO:  alert, approp, nl sensorium with  no motor or cerebellar deficits apparent.      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 Quit smoking 06/2023  - FENO  11/02/23  = 17  - PFT's  01/16/24  FEV1 1.20 (41 % ) ratio 0.38  p 40 % improvement from saba p 0 prior to study with DLCO  10.2 (47%)   and FV curve classically concave  - 02/07/2024  After extensive coaching inhaler device,  effectiveness =    75% (trigger with delayed breath) > try air surpa prn as other inhalers have made her cough worse  - 03/21/2024  After extensive coaching inhaler device,  effectiveness =    90% with hfa  Hfa technique and asthma control good on 0-2 airsupra  today with main cc = cough p coffee assoc with pnds which may be rhinitis or more likely GERD  rather than AB as would be more likely to occur 3-6 am, not just after coffee and not bothering her while sleeping  Rec No change air supra Max diet recs first then consider ppiac and h2 p supper   F/u q 6 m sooner prn     AVS  Patient Instructions  GERD (REFLUX)  is an extremely common cause of respiratory symptoms just like yours , many times with no obvious heartburn at all.    It can be treated with medication, but also with lifestyle changes including elevation of the head of your bed (ideally with 6 -8inch blocks under the headboard of your bed),  Smoking cessation, avoidance of late meals, excessive alcohol, and avoid fatty foods, chocolate,  peppermint, colas, red wine, and acidic juices such as orange juice.  NO MINT OR MENTHOL PRODUCTS SO NO COUGH DROPS - Luden's ok PECTIN USE SUGARLESS CANDY INSTEAD (Jolley ranchers or Stover's or Life Savers) or even ice chips will also do - the key is to swallow to prevent all throat clearing. NO OIL BASED VITAMINS - use powdered substitutes.  Avoid fish oil when coughing.   Please schedule a follow up visit in  6 months but call sooner if needed    Ozell America, MD 03/24/2024

## 2024-03-20 NOTE — Assessment & Plan Note (Signed)
 Secondary to myelosuppression from chemotherapy  - Will add growth factors with this cycle and further cycles - Continue to monitor - Discussed with the patient that she has to go to the ER with a fever of 100.4 or more

## 2024-03-20 NOTE — Assessment & Plan Note (Addendum)
 Recent PET scan with evidence of liver metastasis confirmed by MRI  - Discussed risk versus benefits of liver biopsy.  Patient does not want to do liver biopsy at this time.  Stated that she would consider at a later point if she does not respond to treatment. - Will continue to monitor this on PET scan

## 2024-03-20 NOTE — Assessment & Plan Note (Addendum)
 Patient quit smoking since a diagnosis of lung cancer  - Encouraged to abstain from smoking

## 2024-03-20 NOTE — Progress Notes (Signed)
 Patient has been examined by Dr. Davonna. Vital signs and labs have been reviewed by MD - ANC (1.0), Creatinine, LFTs, hemoglobin, and platelets have been reviewed by M.D. - pt may proceed with treatment.  Primary RN and pharmacy notified.

## 2024-03-20 NOTE — Assessment & Plan Note (Signed)
 Patient has chronic headaches that occur at frequency of twice a week. She is taking Fioricet right now  -Patient inquired about taking ibuprofen as that is only thing that - Patient has normal kidney function.  Can take ibuprofen if it is just 1 dose twice a week - Recommended to hydrate very well - Continue Fioricet

## 2024-03-20 NOTE — Patient Instructions (Signed)

## 2024-03-21 ENCOUNTER — Other Ambulatory Visit: Payer: Self-pay

## 2024-03-21 ENCOUNTER — Ambulatory Visit (INDEPENDENT_AMBULATORY_CARE_PROVIDER_SITE_OTHER): Admitting: Internal Medicine

## 2024-03-21 ENCOUNTER — Encounter: Payer: Self-pay | Admitting: Internal Medicine

## 2024-03-21 VITALS — BP 116/79 | HR 83 | Ht 64.0 in | Wt 137.8 lb

## 2024-03-21 DIAGNOSIS — J449 Chronic obstructive pulmonary disease, unspecified: Secondary | ICD-10-CM | POA: Diagnosis not present

## 2024-03-21 NOTE — Patient Instructions (Signed)
 GERD (REFLUX)  is an extremely common cause of respiratory symptoms just like yours , many times with no obvious heartburn at all.    It can be treated with medication, but also with lifestyle changes including elevation of the head of your bed (ideally with 6 -8inch blocks under the headboard of your bed),  Smoking cessation, avoidance of late meals, excessive alcohol, and avoid fatty foods, chocolate, peppermint, colas, red wine, and acidic juices such as orange juice.  NO MINT OR MENTHOL PRODUCTS SO NO COUGH DROPS - Luden's ok PECTIN USE SUGARLESS CANDY INSTEAD (Jolley ranchers or Stover's or Life Savers) or even ice chips will also do - the key is to swallow to prevent all throat clearing. NO OIL BASED VITAMINS - use powdered substitutes.  Avoid fish oil when coughing.   Please schedule a follow up visit in  6 months but call sooner if needed

## 2024-03-22 ENCOUNTER — Inpatient Hospital Stay

## 2024-03-22 VITALS — BP 111/71 | HR 68 | Temp 97.8°F | Resp 18

## 2024-03-22 DIAGNOSIS — F17200 Nicotine dependence, unspecified, uncomplicated: Secondary | ICD-10-CM | POA: Diagnosis not present

## 2024-03-22 DIAGNOSIS — C7801 Secondary malignant neoplasm of right lung: Secondary | ICD-10-CM

## 2024-03-22 DIAGNOSIS — C787 Secondary malignant neoplasm of liver and intrahepatic bile duct: Secondary | ICD-10-CM | POA: Diagnosis not present

## 2024-03-22 DIAGNOSIS — Z5111 Encounter for antineoplastic chemotherapy: Secondary | ICD-10-CM | POA: Diagnosis not present

## 2024-03-22 DIAGNOSIS — Z5112 Encounter for antineoplastic immunotherapy: Secondary | ICD-10-CM | POA: Diagnosis not present

## 2024-03-22 DIAGNOSIS — C3411 Malignant neoplasm of upper lobe, right bronchus or lung: Secondary | ICD-10-CM | POA: Diagnosis not present

## 2024-03-22 DIAGNOSIS — C7931 Secondary malignant neoplasm of brain: Secondary | ICD-10-CM | POA: Diagnosis not present

## 2024-03-22 DIAGNOSIS — Z79620 Long term (current) use of immunosuppressive biologic: Secondary | ICD-10-CM | POA: Diagnosis not present

## 2024-03-22 MED ORDER — PEGFILGRASTIM-JMDB 6 MG/0.6ML ~~LOC~~ SOSY
6.0000 mg | PREFILLED_SYRINGE | Freq: Once | SUBCUTANEOUS | Status: AC
  Administered 2024-03-22: 6 mg via SUBCUTANEOUS
  Filled 2024-03-22: qty 0.6

## 2024-03-22 NOTE — Progress Notes (Signed)
Patient tolerated Fulphila injection with no complaints voiced.  Site clean and dry with no bruising or swelling noted.  No complaints of pain.  Discharged with vital signs stable and no signs or symptoms of distress noted.  

## 2024-03-22 NOTE — Patient Instructions (Signed)
 [facility] [place] - A DEPT OF [facility]  Discharge Instructions: Thank you for choosing [facility] to provide your oncology and hematology care.  If you have a lab appointment with the Cancer Center - please note that after April 8th, 2024, all labs will be drawn in the cancer center.  You do not have to check in or register with the main entrance as you have in the past but will complete your check-in in the cancer center.  Wear comfortable clothing and clothing appropriate for easy access to any Portacath or PICC line.   We strive to give you quality time with your provider. You may need to reschedule your appointment if you arrive late (15 or more minutes).  Arriving late affects you and other patients whose appointments are after yours.  Also, if you miss three or more appointments without notifying the office, you may be dismissed from the clinic at the provider's discretion.      For prescription refill requests, have your pharmacy contact our office and allow 72 hours for refills to be completed.    Today you received the following chemotherapy and/or immunotherapy agents Fulphila. Pegfilgrastim Injection What is this medication? PEGFILGRASTIM (PEG fil gra stim) lowers the risk of infection in people who are receiving chemotherapy. It works by Systems analyst make more white blood cells, which protects your body from infection. It may also be used to help people who have been exposed to high doses of radiation. This medicine may be used for other purposes; ask your health care provider or pharmacist if you have questions. COMMON BRAND NAME(S): Cherly Hensen, Neulasta, Nyvepria, Stimufend, UDENYCA, UDENYCA ONBODY, Ziextenzo What should I tell my care team before I take this medication? They need to know if you have any of these conditions: Kidney disease Latex allergy Ongoing radiation therapy Sickle cell disease Skin reactions to acrylic  adhesives (On-Body Injector only) An unusual or allergic reaction to pegfilgrastim, filgrastim, other medications, foods, dyes, or preservatives Pregnant or trying to get pregnant Breast-feeding How should I use this medication? This medication is for injection under the skin. If you get this medication at home, you will be taught how to prepare and give the pre-filled syringe or how to use the On-body Injector. Refer to the patient Instructions for Use for detailed instructions. Use exactly as directed. Tell your care team immediately if you suspect that the On-body Injector may not have performed as intended or if you suspect the use of the On-body Injector resulted in a missed or partial dose. It is important that you put your used needles and syringes in a special sharps container. Do not put them in a trash can. If you do not have a sharps container, call your pharmacist or care team to get one. Talk to your care team about the use of this medication in children. While this medication may be prescribed for selected conditions, precautions do apply. Overdosage: If you think you have taken too much of this medicine contact a poison control center or emergency room at once. NOTE: This medicine is only for you. Do not share this medicine with others. What if I miss a dose? It is important not to miss your dose. Call your care team if you miss your dose. If you miss a dose due to an On-body Injector failure or leakage, a new dose should be administered as soon as possible using a single prefilled syringe for manual  use. What may interact with this medication? Interactions have not been studied. This list may not describe all possible interactions. Give your health care provider a list of all the medicines, herbs, non-prescription drugs, or dietary supplements you use. Also tell them if you smoke, drink alcohol, or use illegal drugs. Some items may interact with your medicine. What should I watch for  while using this medication? Your condition will be monitored carefully while you are receiving this medication. You may need blood work done while you are taking this medication. Talk to your care team about your risk of cancer. You may be more at risk for certain types of cancer if you take this medication. If you are going to need a MRI, CT scan, or other procedure, tell your care team that you are using this medication (On-Body Injector only). What side effects may I notice from receiving this medication? Side effects that you should report to your care team as soon as possible: Allergic reactions--skin rash, itching, hives, swelling of the face, lips, tongue, or throat Capillary leak syndrome--stomach or muscle pain, unusual weakness or fatigue, feeling faint or lightheaded, decrease in the amount of urine, swelling of the ankles, hands, or feet, trouble breathing High white blood cell level--fever, fatigue, trouble breathing, night sweats, change in vision, weight loss Inflammation of the aorta--fever, fatigue, back, chest, or stomach pain, severe headache Kidney injury (glomerulonephritis)--decrease in the amount of urine, red or dark brown urine, foamy or bubbly urine, swelling of the ankles, hands, or feet Shortness of breath or trouble breathing Spleen injury--pain in upper left stomach or shoulder Unusual bruising or bleeding Side effects that usually do not require medical attention (report to your care team if they continue or are bothersome): Bone pain Pain in the hands or feet This list may not describe all possible side effects. Call your doctor for medical advice about side effects. You may report side effects to FDA at 1-800-FDA-1088. Where should I keep my medication? Keep out of the reach of children. If you are using this medication at home, you will be instructed on how to store it. Throw away any unused medication after the expiration date on the label. NOTE: This sheet  is a summary. It may not cover all possible information. If you have questions about this medicine, talk to your doctor, pharmacist, or health care provider.  2024 Elsevier/Gold Standard (2021-06-11 00:00:00)      To help prevent nausea and vomiting after your treatment, we encourage you to take your nausea medication as directed.  BELOW ARE SYMPTOMS THAT SHOULD BE REPORTED IMMEDIATELY: *FEVER GREATER THAN 100.4 F (38 C) OR HIGHER *CHILLS OR SWEATING *NAUSEA AND VOMITING THAT IS NOT CONTROLLED WITH YOUR NAUSEA MEDICATION *UNUSUAL SHORTNESS OF BREATH *UNUSUAL BRUISING OR BLEEDING *URINARY PROBLEMS (pain or burning when urinating, or frequent urination) *BOWEL PROBLEMS (unusual diarrhea, constipation, pain near the anus) TENDERNESS IN MOUTH AND THROAT WITH OR WITHOUT PRESENCE OF ULCERS (sore throat, sores in mouth, or a toothache) UNUSUAL RASH, SWELLING OR PAIN  UNUSUAL VAGINAL DISCHARGE OR ITCHING   Items with * indicate a potential emergency and should be followed up as soon as possible or go to the Emergency Department if any problems should occur.  Please show the CHEMOTHERAPY ALERT CARD or IMMUNOTHERAPY ALERT CARD at check-in to the Emergency Department and triage nurse.  Should you have questions after your visit or need to cancel or reschedule your appointment, please contact [facility] [place] - A DEPT  OF [facility] [phone]  and follow the prompts.  Office hours are 8:00 a.m. to 4:30 p.m. Monday - Friday. Please note that voicemails left after 4:00 p.m. may not be returned until the following business day.  We are closed weekends and major holidays. You have access to a nurse at all times for urgent questions. Please call the main number to the clinic [phone] and follow the prompts.  For any non-urgent questions, you may also contact your provider using MyChart. We now offer e-Visits for anyone 64 and older to request care online for non-urgent  symptoms. For details visit [email or web address].   Also download the MyChart app! Go to the app store, search "MyChart", open the app, select [facility], and log in with your MyChart username and password.

## 2024-03-24 NOTE — Assessment & Plan Note (Addendum)
 Quit smoking 06/2023  - FENO  11/02/23  = 17  - PFT's  01/16/24  FEV1 1.20 (41 % ) ratio 0.38  p 40 % improvement from saba p 0 prior to study with DLCO  10.2 (47%)   and FV curve classically concave  - 02/07/2024  After extensive coaching inhaler device,  effectiveness =    75% (trigger with delayed breath) > try air surpa prn as other inhalers have made her cough worse  - 03/21/2024  After extensive coaching inhaler device,  effectiveness =    90% with hfa  Hfa technique and asthma control good on 0-2 airsupra  today with main cc = cough p coffee assoc with pnds which may be rhinitis or more likely GERD  rather than AB as would be more likely to occur 3-6 am, not just after coffee and not bothering her while sleeping  Rec No change air supra Max diet recs first then consider ppiac and h2 p supper   F/u q 6 m sooner prn

## 2024-03-28 ENCOUNTER — Encounter: Payer: Self-pay | Admitting: Oncology

## 2024-04-01 ENCOUNTER — Other Ambulatory Visit (INDEPENDENT_AMBULATORY_CARE_PROVIDER_SITE_OTHER): Admitting: Pharmacist

## 2024-04-01 NOTE — Progress Notes (Signed)
 Pharmacy Quality Measure Review  This patient is appearing on a report for being at risk of failing the Glycemic Status Assessment in Diabetes measure this calendar year- however, does not appear that patient has a diagnosis of diabetes. Appears mealtime insulin was prescribed in March which pulled her into this measure.   Last documented A1c 5.4% on 10/11/23 at [facility].   No action needed at this time.   Catie IVAR Centers, PharmD, [facility] Clinical Pharmacist [phone]

## 2024-04-08 ENCOUNTER — Ambulatory Visit (INDEPENDENT_AMBULATORY_CARE_PROVIDER_SITE_OTHER): Admitting: Diagnostic Neuroimaging

## 2024-04-08 ENCOUNTER — Encounter: Payer: Self-pay | Admitting: Diagnostic Neuroimaging

## 2024-04-08 VITALS — BP 130/82 | HR 74 | Ht 64.0 in | Wt 139.0 lb

## 2024-04-08 DIAGNOSIS — C7801 Secondary malignant neoplasm of right lung: Secondary | ICD-10-CM | POA: Diagnosis not present

## 2024-04-08 DIAGNOSIS — R519 Headache, unspecified: Secondary | ICD-10-CM | POA: Diagnosis not present

## 2024-04-08 DIAGNOSIS — Z8669 Personal history of other diseases of the nervous system and sense organs: Secondary | ICD-10-CM | POA: Diagnosis not present

## 2024-04-08 DIAGNOSIS — C7931 Secondary malignant neoplasm of brain: Secondary | ICD-10-CM | POA: Diagnosis not present

## 2024-04-08 MED ORDER — TOPIRAMATE 25 MG PO TABS: 25.0000 mg | ORAL_TABLET | Freq: Two times a day (BID) | ORAL | 6 refills | Status: DC

## 2024-04-08 MED ORDER — RIZATRIPTAN BENZOATE 10 MG PO TBDP
10.0000 mg | ORAL_TABLET | ORAL | 11 refills | Status: AC | PRN
Start: 2024-04-08 — End: ?

## 2024-04-08 MED ORDER — TRAMADOL HCL 50 MG PO TABS: 50.0000 mg | ORAL_TABLET | Freq: Two times a day (BID) | ORAL | 0 refills | Status: AC | PRN

## 2024-04-08 NOTE — Progress Notes (Signed)
   04/08/24 1300  Spiritual Encounters  Type of Visit Follow up  Care provided to: Patient  Referral source Chaplain assessment  Reason for visit Routine spiritual support  Spiritual Framework  Presenting Themes Significant life change;Impactful experiences and emotions   Chaplain follow up with Pt via telephone call.  When I last spoke with Pt she was planning to gett married this weekend.  I called to check in and see how plans were going and if she felt well enough for the ceremony and the honeymoon.  Pt reports be excited and feeling good enough.  She rearranged a chemo appointment to be sure she'd have a good week.  Chaplain will continue to follow.  [name], MDiv  Chaplain, [facility] Friend [email or web address] [phone]

## 2024-04-08 NOTE — Patient Instructions (Signed)
 HEADACHE PREVENTION  -Decrease or avoid caffeine  / alcohol -Eat and sleep on a regular schedule -Exercise several times per week - start topiramate  25-50mg  at bedtime; drink plenty of water  HEADACHE / MIGRAINE RESCUE  - fioricet, tramadol  as needed - rizatriptan  (Maxalt ) 10mg  as needed for breakthrough headache; may repeat x 1 after 2 hours; max 2 tabs per day or 8 per month

## 2024-04-08 NOTE — Progress Notes (Signed)
 [facility]  PATIENT: Julie Horn DOB: 12-27-1975  REFERRING CLINICIAN: Davonna Siad, MD HISTORY FROM: patient  REASON FOR VISIT: new consult   HISTORICAL  CHIEF COMPLAINT:  Chief Complaint  Patient presents with   New Patient (Initial Visit)    RM 7, Pt alone, referred by oncologist for headaches. Has hx of brain tumor.    HISTORY OF PRESENT ILLNESS:   48 year old female here for evaluation of headaches.  Patient had onset of headaches starting in February 2025.  Went to PCP and had MRI of the brain ordered.  She was found to have a cerebellar mass and went to the hospital for further evaluation.  CT of the chest abdomen pelvis obtained demonstrating a right upper lung spiculated mass.  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  She underwent treatment with chemotherapy and radiation therapy.  Since that time has continued to have persistent headaches which are mild to moderate on a daily basis and severe twice a week.  Headaches are mainly bicipital region.  They tend to wake her up from sleep.  These are associated with nausea and pressure sensation.  She has used Fioricet and tramadol  with some relief.  History of migraine headaches in childhood, previously treated around 2008 with topiramate , Zomig and Maxalt .  History of nausea vomiting, sensitive to light sound with prior headaches.   REVIEW OF SYSTEMS: Full 14 system review of systems performed and negative with exception of: as per HPI.  ALLERGIES: Allergies  Allergen Reactions   Levofloxacin Other (See Comments)    Red skin, felt like skin was on fire, sensitive to touch   Sulfa Antibiotics Other (See Comments)    Severe stomach pain   Azithromycin Rash   Doxycycline  Rash   Latex Rash   Wound Dressing Adhesive Rash    Tape     HOME MEDICATIONS: Outpatient Medications Prior to Visit  Medication Sig Dispense  Refill   acetaminophen  (TYLENOL ) 500 MG tablet Take 1,000 mg by mouth every 6 (six) hours as needed for mild pain (pain score 1-3). Take 2 tablets (1,000 mg total) by mouth every six (6) hours.     Albuterol -Budesonide  (AIRSUPRA ) 90-80 MCG/ACT AERO Inhale 2 puffs into the lungs every 4 (four) hours as needed. 10.7 g 11   ascorbic acid (VITAMIN C) 500 MG tablet Take 500 mg by mouth daily. Take 1 tablet (500 mg total) by mouth daily     butalbital -acetaminophen -caffeine  (FIORICET) 50-325-40 MG tablet Take 1 tablet by mouth every 4 (four) hours as needed for headache. 14 tablet 1   co-enzyme Q-10 30 MG capsule Take 30 mg by mouth 3 (three) times daily. Take 1 capsule (30 mg total) by mouth Three (3) times a day.     Cyanocobalamin  (VITAMIN B 12) 500 MCG TABS Take 500 mg by mouth daily.     dexamethasone  (DECADRON ) 4 MG tablet Take twice daily for 3 days following each treatment 30 tablet 0   folic acid  (FOLVITE ) 1 MG tablet Take 1 tablet (1 mg total) by mouth daily. 30 tablet 6   guaiFENesin  (MUCINEX ) 600 MG 12 hr tablet Take 1,200 mg by mouth 2 (two) times daily. Take 1 tablet (1200 mg total) by mouth every twelve (12) hours.     hydrOXYzine  (VISTARIL ) 25 MG capsule Take 1 capsule (25 mg total) by mouth 3 (three) times daily. For anxiety and sleep. 90 capsule 5   magnesium oxide (MAG-OX) 400 (240 Mg) MG  tablet Take 400 mg by mouth daily. Take 1 tablet (400 mg total) by mouth daily.     melatonin 1 MG TABS tablet Take 1 mg by mouth at bedtime. Take 5 tablets (5 mg total) by mouth nightly.     ondansetron  (ZOFRAN -ODT) 4 MG disintegrating tablet Take 4 mg by mouth every 8 (eight) hours as needed.     PACLitaxel  (TAXOL  IV) Inject 1 Units into the vein every 21 ( twenty-one) days.     pegfilgrastim -jmdb (FULPHILA ) 6 MG/0.6ML injection Inject 6 mg into the skin every 21 ( twenty-one) days.     Pembrolizumab  (KEYTRUDA  IV) Inject 1 Units into the vein every 21 ( twenty-one) days.     PEMEtrexed  Disodium  (ALIMTA  IV) Inject 1 Units into the vein every 21 ( twenty-one) days.     prochlorperazine  (COMPAZINE ) 10 MG tablet Take 10 mg by mouth every 6 (six) hours as needed for nausea or vomiting.     sennosides-docusate sodium (SENOKOT-S) 8.6-50 MG tablet Take 2 tablets by mouth daily.     traZODone  (DESYREL ) 150 MG tablet USE FROM 1/3 TO 1 TABLET NIGHTLY AS NEEDED FOR SLEEP. 90 tablet 1   ibuprofen (ADVIL) 100 MG tablet Take 200-400 mg by mouth every 6 (six) hours as needed for pain.     No facility-administered medications prior to visit.    PAST MEDICAL HISTORY: Past Medical History:  Diagnosis Date   Abnormal Pap smear of cervix 04/17/2018   Asthma    GERD (gastroesophageal reflux disease)    History of migraine 04/17/2018   Had been managed on Topamax  but had side effect. Now not active.   Raynaud's disease    Spondylolisthesis of lumbar region 04/17/2018    PAST SURGICAL HISTORY: Past Surgical History:  Procedure Laterality Date   Craniectomy  10/11/2023   tumor removal   IR IMAGING GUIDED PORT INSERTION  11/03/2023   LAPAROSCOPY     TONSILLECTOMY AND ADENOIDECTOMY  1983    FAMILY HISTORY: Family History  Problem Relation Age of Onset   Breast cancer Mother    Asthma Mother    Polymyalgia rheumatica Mother    Ehlers-Danlos syndrome Mother    Cancer Maternal Grandmother    Heart attack Maternal Grandfather    Asthma Brother    Arthritis Brother     SOCIAL HISTORY: Social History   Socioeconomic History   Marital status: Divorced    Spouse name: Not on file   Number of children: 1   Years of education: Not on file   Highest education level: Bachelor's degree (e.g., BA, AB, BS)  Occupational History   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    Smoked 1 PPD at her heaviest.     Quit smoking in 06/2023- khj 10/03/2023  Vaping Use   Vaping status: Former  Substance and Sexual Activity   Alcohol use: Not Currently    Comment: occasionally    Drug use: Not Currently   Sexual activity: Yes    Birth control/protection: None    Comment: [id number] ,? infertility  Other Topics Concern  Not on file  Social History Narrative   Patient getting married 04/12/24   Social Drivers of Health   Financial Resource Strain: Patient Declined (10/17/2023)   Overall Financial Resource Strain (CARDIA)    Difficulty of Paying Living Expenses: Patient declined  Food Insecurity: No Food Insecurity (11/02/2023)   [facility] from [facility]   Hunger Vital Sign    Within the past 12 months, you worried that your food would run out before you got the money to buy more.: Never true    Within the past 12 months, the food you bought just didn't last and you didn't have money to get more.: Never true  Transportation Needs: No Transportation Needs (11/02/2023)   Received from [facility] - Transportation    Lack of Transportation (Medical): No    Lack of Transportation (Non-Medical): No  Physical Activity: Insufficiently Active (10/17/2023)   Exercise Vital Sign    Days of Exercise per Week: 3 days    Minutes of Exercise per Session: 20 min  Stress: No Stress Concern Present (10/17/2023)   [employer] of Occupational [employer] - Occupational Stress Questionnaire    Feeling of Stress : Not at all  Social Connections: Unknown (10/17/2023)   Social Connection and Isolation Panel    Frequency of Communication with Friends and Family: More than three times a week    Frequency of Social Gatherings with Friends and Family: More than three times a week    Attends Religious Services: Patient declined    Database administrator or Organizations:  Patient declined    Attends Banker Meetings: Not on file    Marital Status: Living with partner  Intimate Partner Violence: Not At Risk (10/13/2023)   Humiliation, Afraid, Rape, and Kick questionnaire    Fear of Current or Ex-Partner: No    Emotionally Abused: No    Physically Abused: No    Sexually Abused: No     PHYSICAL EXAM  GENERAL EXAM/CONSTITUTIONAL: Vitals:  Vitals:   04/08/24 0917  BP: 130/82  Pulse: 74  Weight: 139 lb (63 kg)  Height: 5' 4 (1.626 m)   Body mass index is 23.86 kg/m. Wt Readings from Last 3 Encounters:  04/08/24 139 lb (63 kg)  03/21/24 137 lb 12.8 oz (62.5 kg)  03/20/24 138 lb 12.8 oz (63 kg)   Patient is in no distress; well developed, nourished and groomed; neck is supple  CARDIOVASCULAR: Examination of carotid arteries is normal; no carotid bruits Regular rate and rhythm, no murmurs Examination of peripheral vascular system by observation and palpation is normal  EYES: Ophthalmoscopic exam of optic discs and posterior segments is normal; no papilledema or hemorrhages No results found.  MUSCULOSKELETAL: Gait, strength, tone, movements noted in Neurologic exam below  NEUROLOGIC: MENTAL STATUS:      No data to display         awake, alert, oriented to person, place and time recent and remote memory intact normal attention and concentration language fluent, comprehension intact, naming intact fund of knowledge appropriate  CRANIAL NERVE:  2nd - no papilledema on fundoscopic exam 2nd, 3rd, 4th, 6th - pupils equal and reactive to light, visual fields full to confrontation, extraocular muscles intact, no nystagmus 5th - facial sensation symmetric 7th - facial strength symmetric 8th - hearing intact 9th - palate elevates symmetrically, uvula midline 11th - shoulder shrug symmetric 12th - tongue protrusion midline  MOTOR:  normal bulk and tone, full strength in the  BUE, BLE  SENSORY:  normal and symmetric to  light touch, temperature, vibration  COORDINATION:  finger-nose-finger, fine finger movements normal  REFLEXES:  deep tendon reflexes present and symmetric  GAIT/STATION:  narrow based gait     DIAGNOSTIC DATA (LABS, IMAGING, TESTING) - I reviewed patient records, labs, notes, testing and imaging myself where available.  Lab Results  Component Value Date   WBC 1.9 (L) 03/20/2024   HGB 12.5 03/20/2024   HCT 35.5 (L) 03/20/2024   MCV 96.5 03/20/2024   PLT 144 (L) 03/20/2024      Component Value Date/Time   NA 139 03/20/2024 0810   NA 145 (H) 05/22/2023 1054   K 3.6 03/20/2024 0810   CL 104 03/20/2024 0810   CO2 25 03/20/2024 0810   GLUCOSE 98 03/20/2024 0810   BUN 11 03/20/2024 0810   BUN 7 05/22/2023 1054   CREATININE 0.52 03/20/2024 0810   CALCIUM 9.4 03/20/2024 0810   PROT 6.8 03/20/2024 0810   PROT 7.4 05/22/2023 1054   ALBUMIN 3.8 03/20/2024 0810   ALBUMIN 4.8 05/22/2023 1054   AST 20 03/20/2024 0810   ALT 24 03/20/2024 0810   ALKPHOS 62 03/20/2024 0810   BILITOT 0.6 03/20/2024 0810   BILITOT 0.5 05/22/2023 1054   GFRNONAA >60 03/20/2024 0810   Lab Results  Component Value Date   CHOL 212 (H) 05/22/2023   HDL 89 05/22/2023   LDLCALC 107 (H) 05/22/2023   TRIG 93 05/22/2023   CHOLHDL 2.4 05/22/2023   No results found for: HGBA1C No results found for: VITAMINB12 Lab Results  Component Value Date   TSH 2.645 02/27/2024    09/25/23 MRI brain [I reviewed images myself and agree with interpretation. -VRP]  1. Heterogeneously contrast-enhancing mass of the right cerebellar hemisphere with a large amount of surrounding vasogenic edema. Metastasis and hemangioblastoma are the most common infratentorial tumors in adults. Astrocytoma is another possibility. 2. Mass effect on the fourth ventricle without hydrocephalus.  10/11/2023 Final  A: Brain tumor, biopsy - Metastatic poorly differentiated adenocarcinoma consistent with lung primary.  B: Brain  tumor, right, resection - Metastatic poorly differentiated adenocarcinoma consistent with lung primary.  C: Brain tumor, right, specimen trap - Metastatic poorly differentiated adenocarcinoma consistent with lung primary.  10/26/23 MRI brain (with and without) [I reviewed images myself and agree with interpretation. -VRP]  Status post right occipital craniotomy for mass resection. Marked reduction in edema and mass effect, with a now normal appearance of the fourth ventricle. Blood products are present in the region of the approach and resection, with a small amount of adjacent enhancement as would be expected at this time. The study is indeterminate for residual marginal tumor and additional follow-up is warranted.  No new or second lesion identified.  02/05/24 MRI brain  --Postsurgical changes of right cerebellar mass resection with further collapse of the resection cavity. Decreasing peripheral enhancement of the resection cavity is favored postsurgical.  --Multiple new enhancing supratentorial intracranial lesions and new nodular leptomeningeal enhancement in the left occipital lobe, concerning for worsening metastases.  -- Questioned enhancement in the right greater than left internal auditory canals, potentially additional sites of leptomeningeal disease.     ASSESSMENT AND PLAN  48 y.o. year old female here with:  Dx:  1. Metastasis to brain (HCC)   2. Metastatic lung carcinoma, right (HCC)   3. History of migraine   4. Occipital headache     PLAN:  POSITIONAL HEADACHE / POST-TREATMENT HEADACHE (metastatic lung cancer to  the brain, status post biopsy, chemotherapy, radiation therapy; history of migraine with aura)  HEADACHE / MIGRAINE PREVENTION  LIFESTYLE CHANGES -Decrease or avoid caffeine  / alcohol -Eat and sleep on a regular schedule -Exercise several times per week - start topiramate  25-50mg  at bedtime; drink plenty of water  Consider 2nd line (if 1st line not  working or tolerated) - rimegepant (Nurtec) 75mg  every other day - atogepant Woody) 60mg  daily - erenumab (Aimovig) 70mg  monthly (may increase to 140mg  monthly) - fremanezumab (Ajovy) 225mg  monthly (or 675mg  every 3 months) - galazanezumab (Emgality) 240mg  loading dose; then 120mg  monthly   HEADACHE / MIGRAINE RESCUE  - fioricet, tramadol  as needed - rizatriptan  (Maxalt ) 10mg  as needed for breakthrough headache; may repeat x 1 after 2 hours; max 2 tabs per day or 8 per month  Consider CGRP antagonists (if triptans not working or contraindicated) - ubrogepant Deadra) 100mg  as needed for breakthrough headache; may repeat x 1 after 2 hours; max 2 tabs per day or 8 per month - rimegepant (Nurtec) 75mg  as needed for breakthrough headache; max 8 per month  Meds ordered this encounter  Medications   topiramate  (TOPAMAX ) 25 MG tablet    Sig: Take 1-2 tablets (25-50 mg total) by mouth 2 (two) times daily.    Dispense:  120 tablet    Refill:  6   traMADol  (ULTRAM ) 50 MG tablet    Sig: Take 1 tablet (50 mg total) by mouth every 12 (twelve) hours as needed for severe pain (pain score 7-10) (headaches).    Dispense:  30 tablet    Refill:  0   rizatriptan  (MAXALT -MLT) 10 MG disintegrating tablet    Sig: Take 1 tablet (10 mg total) by mouth as needed for migraine. May repeat in 2 hours if needed    Dispense:  9 tablet    Refill:  11   Return in about 4 months (around 08/08/2024) for MyChart visit (15 min).  I spent 60 minutes of face-to-face and non-face-to-face time with patient.  This included previsit chart review, lab review, study review, order entry, electronic health record documentation, patient education.    EDUARD FABIENE HANLON, MD 04/08/2024, 2:32 PM Certified in Neurology, Neurophysiology and Neuroimaging  [facility] [address], [address] [place], [place] [address] [phone]

## 2024-04-09 ENCOUNTER — Other Ambulatory Visit: Payer: Self-pay

## 2024-04-10 ENCOUNTER — Other Ambulatory Visit: Payer: Self-pay | Admitting: Oncology

## 2024-04-10 DIAGNOSIS — C7801 Secondary malignant neoplasm of right lung: Secondary | ICD-10-CM

## 2024-04-17 ENCOUNTER — Inpatient Hospital Stay: Attending: Hematology

## 2024-04-17 ENCOUNTER — Inpatient Hospital Stay (HOSPITAL_BASED_OUTPATIENT_CLINIC_OR_DEPARTMENT_OTHER): Admitting: Oncology

## 2024-04-17 ENCOUNTER — Inpatient Hospital Stay

## 2024-04-17 VITALS — Wt 140.7 lb

## 2024-04-17 VITALS — BP 112/69 | HR 75 | Resp 19

## 2024-04-17 DIAGNOSIS — Z87891 Personal history of nicotine dependence: Secondary | ICD-10-CM | POA: Insufficient documentation

## 2024-04-17 DIAGNOSIS — C7931 Secondary malignant neoplasm of brain: Secondary | ICD-10-CM

## 2024-04-17 DIAGNOSIS — Z79620 Long term (current) use of immunosuppressive biologic: Secondary | ICD-10-CM | POA: Insufficient documentation

## 2024-04-17 DIAGNOSIS — Z5112 Encounter for antineoplastic immunotherapy: Secondary | ICD-10-CM | POA: Diagnosis not present

## 2024-04-17 DIAGNOSIS — R519 Other chronic pain: Secondary | ICD-10-CM

## 2024-04-17 DIAGNOSIS — C3411 Malignant neoplasm of upper lobe, right bronchus or lung: Secondary | ICD-10-CM | POA: Diagnosis present

## 2024-04-17 DIAGNOSIS — C787 Secondary malignant neoplasm of liver and intrahepatic bile duct: Secondary | ICD-10-CM

## 2024-04-17 DIAGNOSIS — Z79899 Other long term (current) drug therapy: Secondary | ICD-10-CM | POA: Insufficient documentation

## 2024-04-17 DIAGNOSIS — C7801 Secondary malignant neoplasm of right lung: Secondary | ICD-10-CM

## 2024-04-17 DIAGNOSIS — E032 Hypothyroidism due to medicaments and other exogenous substances: Secondary | ICD-10-CM | POA: Insufficient documentation

## 2024-04-17 DIAGNOSIS — Z5111 Encounter for antineoplastic chemotherapy: Secondary | ICD-10-CM | POA: Diagnosis not present

## 2024-04-17 DIAGNOSIS — G8929 Other chronic pain: Secondary | ICD-10-CM

## 2024-04-17 DIAGNOSIS — R110 Nausea: Secondary | ICD-10-CM

## 2024-04-17 DIAGNOSIS — D72819 Decreased white blood cell count, unspecified: Secondary | ICD-10-CM

## 2024-04-17 LAB — CBC WITH DIFFERENTIAL/PLATELET
Abs Immature Granulocytes: 0.01 K/uL (ref 0.00–0.07)
Basophils Absolute: 0.1 K/uL (ref 0.0–0.1)
Basophils Relative: 2 %
Eosinophils Absolute: 0.2 K/uL (ref 0.0–0.5)
Eosinophils Relative: 7 %
HCT: 36 % (ref 36.0–46.0)
Hemoglobin: 12.4 g/dL (ref 12.0–15.0)
Immature Granulocytes: 0 %
Lymphocytes Relative: 19 %
Lymphs Abs: 0.6 K/uL — ABNORMAL LOW (ref 0.7–4.0)
MCH: 33.9 pg (ref 26.0–34.0)
MCHC: 34.4 g/dL (ref 30.0–36.0)
MCV: 98.4 fL (ref 80.0–100.0)
Monocytes Absolute: 0.4 K/uL (ref 0.1–1.0)
Monocytes Relative: 15 %
Neutro Abs: 1.7 K/uL (ref 1.7–7.7)
Neutrophils Relative %: 57 %
Platelets: 188 K/uL (ref 150–400)
RBC: 3.66 MIL/uL — ABNORMAL LOW (ref 3.87–5.11)
RDW: 13.6 % (ref 11.5–15.5)
WBC: 3 K/uL — ABNORMAL LOW (ref 4.0–10.5)
nRBC: 0 % (ref 0.0–0.2)

## 2024-04-17 LAB — COMPREHENSIVE METABOLIC PANEL WITH GFR
ALT: 19 U/L (ref 0–44)
AST: 17 U/L (ref 15–41)
Albumin: 3.8 g/dL (ref 3.5–5.0)
Alkaline Phosphatase: 63 U/L (ref 38–126)
Anion gap: 11 (ref 5–15)
BUN: 7 mg/dL (ref 6–20)
CO2: 25 mmol/L (ref 22–32)
Calcium: 9.2 mg/dL (ref 8.9–10.3)
Chloride: 103 mmol/L (ref 98–111)
Creatinine, Ser: 0.57 mg/dL (ref 0.44–1.00)
GFR, Estimated: >60 mL/min (ref >60)
Glucose, Bld: 95 mg/dL (ref 70–99)
Potassium: 3.7 mmol/L (ref 3.5–5.1)
Sodium: 139 mmol/L (ref 135–145)
Total Bilirubin: 0.5 mg/dL (ref 0.0–1.2)
Total Protein: 6.4 g/dL — ABNORMAL LOW (ref 6.5–8.1)

## 2024-04-17 LAB — MAGNESIUM: Magnesium: 2.2 mg/dL (ref 1.7–2.4)

## 2024-04-17 LAB — TSH: TSH: 35.221 u[IU]/mL — ABNORMAL HIGH (ref 0.350–4.500)

## 2024-04-17 MED ORDER — SODIUM CHLORIDE 0.9 % IV SOLN
200.0000 mg | Freq: Once | INTRAVENOUS | Status: AC
  Administered 2024-04-17: 200 mg via INTRAVENOUS
  Filled 2024-04-17: qty 8

## 2024-04-17 MED ORDER — LEVOTHYROXINE SODIUM 50 MCG PO TABS: 50.0000 ug | ORAL_TABLET | Freq: Every day | ORAL | 1 refills | Status: DC

## 2024-04-17 MED ORDER — PALONOSETRON HCL INJECTION 0.25 MG/5ML
0.2500 mg | Freq: Once | INTRAVENOUS | Status: AC
  Administered 2024-04-17: 0.25 mg via INTRAVENOUS
  Filled 2024-04-17: qty 5

## 2024-04-17 MED ORDER — CYANOCOBALAMIN 1000 MCG/ML IJ SOLN
1000.0000 ug | Freq: Once | INTRAMUSCULAR | Status: AC
  Administered 2024-04-17: 1000 ug via INTRAMUSCULAR
  Filled 2024-04-17: qty 1

## 2024-04-17 MED ORDER — DEXAMETHASONE SODIUM PHOSPHATE 10 MG/ML IJ SOLN
10.0000 mg | Freq: Once | INTRAMUSCULAR | Status: AC
  Administered 2024-04-17: 10 mg via INTRAVENOUS
  Filled 2024-04-17: qty 1

## 2024-04-17 MED ORDER — SODIUM CHLORIDE 0.9 % IV SOLN: INTRAVENOUS | Status: DC

## 2024-04-17 MED ORDER — SODIUM CHLORIDE 0.9 % IV SOLN
150.0000 mg | Freq: Once | INTRAVENOUS | Status: AC
  Administered 2024-04-17: 150 mg via INTRAVENOUS
  Filled 2024-04-17: qty 5

## 2024-04-17 MED ORDER — SODIUM CHLORIDE 0.9 % IV SOLN
500.0000 mg/m2 | Freq: Once | INTRAVENOUS | Status: AC
  Administered 2024-04-17: 900 mg via INTRAVENOUS
  Filled 2024-04-17: qty 20

## 2024-04-17 MED ORDER — SODIUM CHLORIDE 0.9 % IV SOLN
570.0000 mg | Freq: Once | INTRAVENOUS | Status: AC
  Administered 2024-04-17: 570 mg via INTRAVENOUS
  Filled 2024-04-17: qty 57

## 2024-04-17 NOTE — Assessment & Plan Note (Addendum)
 Patient has chemotherapy-induced nausea.  Now better controlled with Compazine   Resolved at this time

## 2024-04-17 NOTE — Assessment & Plan Note (Addendum)
 Patient has chronic headaches that occur at frequency of twice a week. She is taking Fioricet right now Seeing neurology recently and was started on tramadol   - Continue tramadol  and Fioricet as needed - Continue to follow-up with urology

## 2024-04-17 NOTE — Assessment & Plan Note (Addendum)
 Patient has leukopenia without neutropenia at this time.  Significantly improved from prior. Likely chemotherapy and radiation therapy-induced  - Continue to monitor for now

## 2024-04-17 NOTE — Assessment & Plan Note (Addendum)
 Patient quit smoking since a diagnosis of lung cancer  - Encouraged to abstain from smoking

## 2024-04-17 NOTE — Assessment & Plan Note (Addendum)
 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   S/p cerebellar lesion resection consistent with lung primary.   Oncology history as below. PET scan showed limited disease to lung and lymph nodes.  Discussed at tumor board, surgery is not a feasible option secondary to lymph node involvement.  Recommended chemoradiation. Caris: PD-L1: 100% Completed chemoRT on 01/08/2024 with carboplatin  and paclitaxel  Recent MRI brain with new sites of metastasis and CT scan within evidence of new liver lesion with response locally in the lung. MRI liver with confirmation of liver metastasis. Discussion at the thoracic tumor board, and started on chemoimmunotherapy on 02/28/2024: Started on carboplatin , pemetrexed  and pembrolizumab .    -Cycle 3-day 1 today.  Patient tolerated previous cycle well with no significant side effects - Labs reviewed today:CMP: WNL, CBC: WBC:3.0, ANC: 1700, hemoglobin: 12.4, platelets: 188 -Physical exam stable.  Okay to continue with treatment today -Patient requested to hold G-CSF this cycle. - Will obtain a PET scan 2 weeks after completing chemotherapy I.e 4 cycles. - Educated patient on reaching out if she has rash, shortness of breath, abdominal pain, diarrhea that persists -Will repeat TSH and T4 every 6 weeks  Return to clinic before next cycle of chemoimmunotherapy.

## 2024-04-17 NOTE — Progress Notes (Signed)
   04/17/24 1100  Spiritual Encounters  Type of Visit Follow up  Care provided to: Patient  Conversation partners present during encounter Other (comment) (other Pt in room)  Referral source Chaplain assessment  Reason for visit Routine spiritual support   Reason for Visit: Chaplain making scheduled follow-up with Pt I previously connected with.  Description of Visit: I entered the room and found Julie Horn sitting in the chair receiving his treatment, with no support person present. She was in conversation with the other Pt in the room  Knowing that our conversation couldn't be very deep since there was a third person involved, I simply explored the details of Julie Horn's recent wedding and honeymoon to [place].    Julie Horn also spoke that her plan is to "retire" sometime around the beginning of the new year to focus on family and her health.  Plan of Care: I will continue to follow-up with Julie Horn on a bi-weekly basis.   [name], MDiv  Chaplain, [facility] [email or web address] [phone]

## 2024-04-17 NOTE — Progress Notes (Signed)

## 2024-04-17 NOTE — Patient Instructions (Signed)

## 2024-04-17 NOTE — Assessment & Plan Note (Addendum)
 Recent PET scan with evidence of liver metastasis confirmed by MRI  - Discussed risk versus benefits of liver biopsy.  Patient does not want to do liver biopsy at this time.  Stated that she would consider at a later point if she does not respond to treatment. - Will continue to monitor this on PET scan

## 2024-04-17 NOTE — Patient Instructions (Signed)

## 2024-04-17 NOTE — Progress Notes (Signed)
 Patient Care Team: Zollie Lowers, MD as PCP - General (Family Medicine) Dolphus Reiter, MD as Consulting Physician (Rheumatology) Davonna Siad, MD as Medical Oncologist (Medical Oncology) Celestia Joesph SQUIBB, RN as Oncology Nurse Navigator (Medical Oncology) Tamea Dedra CROME, MD as Consulting Physician (Pulmonary Disease) Darlean Ozell NOVAK, MD as Consulting Physician (Pulmonary Disease)  Clinic Day:  04/17/2024  Referring physician: Zollie Lowers, MD   CHIEF COMPLAINT:  CC: Metastatic lung adenocarcinoma   ASSESSMENT & PLAN:   Assessment & Plan: Julie Horn  is a 48 y.o. 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   S/p cerebellar lesion resection consistent with lung primary.   Oncology history as below. PET scan showed limited disease to lung and lymph nodes.  Discussed at tumor board, surgery is not a feasible option secondary to lymph node involvement.  Recommended chemoradiation. Caris: PD-L1: 100% Completed chemoRT on 01/08/2024 with carboplatin  and paclitaxel  Recent MRI brain with new sites of metastasis and CT scan within evidence of new liver lesion with response locally in the lung. MRI liver with confirmation of liver metastasis. Discussion at the thoracic tumor board, and started on chemoimmunotherapy on 02/28/2024: Started on carboplatin , pemetrexed  and pembrolizumab .    -Cycle 3-day 1 today.  Patient tolerated previous cycle well with no significant side effects - Labs reviewed today:CMP: WNL, CBC: WBC:3.0, ANC: 1700, hemoglobin: 12.4, platelets: 188 -Physical exam stable.  Okay to continue with treatment today -Patient requested to hold G-CSF this cycle. - Will obtain a PET scan 2 weeks after completing chemotherapy I.e 4 cycles. - Educated patient on reaching out if she has rash, shortness  of breath, abdominal pain, diarrhea that persists -Will repeat TSH and T4 every 6 weeks  Return to clinic before next cycle of chemoimmunotherapy. Metastasis to brain [facility]) Cerebellar lesion s/p resection consistent with the poorly differentiated adenocarcinoma of lung primary. Patient completed gamma knife at [facility].  Currently on a clinical trial assessing gamma knife versus gamma tile Completed steroid taper. Recent MRI brain with multiple new brain metastasis.  Completed SBRT at [facility]  - Continue to follow with [facility]. - Next MRI brain scheduled for October 1st. Will follow up.  Metastasis to liver [facility]) Recent PET scan with evidence of liver metastasis confirmed by MRI  - Discussed risk versus benefits of liver biopsy.  Patient does not want to do liver biopsy at this time.  Stated that she would consider at a later point if she does not respond to treatment. 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  Now better controlled with Compazine   Resolved at this time Leukopenia, unspecified type Patient has leukopenia without neutropenia at this time.  Significantly improved from prior. Likely chemotherapy and radiation therapy-induced  - Continue to monitor for now Former smoker Patient quit smoking since a diagnosis of lung cancer  - Encouraged to abstain from smoking Chronic nonintractable headache, unspecified headache type Patient has chronic headaches that occur at frequency of twice a week. She is taking Fioricet right now Seeing neurology recently and was started on tramadol   - Continue  tramadol  and Fioricet as needed - Continue to follow-up with urology    The patient understands the plans discussed today and is in agreement with them.  She knows to contact our office if she develops concerns prior to her next appointment.  I provided 30 minutes of face-to-face time during this encounter and > 50% was spent counseling as documented under my assessment and plan.    Mickiel Dry, MD  [facility] - A DEPT OF [facility] [address] [place] [address] Dept: [phone] Dept Fax: [phone]      ONCOLOGY HISTORY:   Oncology History  Metastatic lung carcinoma, right (HCC)  09/25/2023 Imaging   CT CAP w contrast:  1. Spiculated mass in the posterior right pulmonary apex measuring 2.6 x 1.4 cm, consistent with primary bronchogenic malignancy. 2. Enlarged right hilar lymph node measuring 1.5 x 1.2 cm, consistent with nodal metastatic disease. 3. Tiny hypodensities in the central liver, incompletely characterized, although statistically likely benign cysts or hemangiomas. Small metastases not strictly excluded. 4. Soft tissue nodule within the right gluteal subcutaneous fat;given location most likely an injection granuloma, however subcutaneous soft tissue metastases are occasionally seen in metastatic lung malignancy. Attention on follow-up.     09/25/2023 Imaging   MRI brain w wo contrast:  1. Heterogeneously contrast-enhancing mass of the right cerebellar hemisphere with a large amount of surrounding vasogenic edema. Metastasis and hemangioblastoma are the most common infratentorial tumors in adults. Astrocytoma is another possibility. 2. Mass effect on the fourth ventricle without hydrocephalus.   09/27/2023 Initial Diagnosis   Metastatic lung carcinoma, right (HCC)   10/05/2023 PET scan   IMPRESSION: 1. Hypermetabolic RIGHT upper lobe lung nodule consistent primary bronchogenic carcinoma. 2. Mildly hypermetabolic RIGHT lower paratracheal and RIGHT hilar lymph  nodes are concerning for nodal metastasis. No contralateral hypermetabolic lymph nodes. 3. No evidence of distant metastatic disease. 4. Intense activity through the distal anal canal is favored physiologic. Recommend digital rectal exam.   10/11/2023 Procedure   Resection of cerebellar metastasis   10/11/2023 Pathology Results   Diagnosis   A: Brain tumor, biopsy - Metastatic poorly differentiated adenocarcinoma consistent with lung primary.   B: Brain tumor, right, resection - Metastatic poorly differentiated adenocarcinoma consistent with lung primary.   C: Brain tumor, right, specimen trap - Metastatic poorly differentiated adenocarcinoma consistent with lung primary.     10/26/2023 Imaging   MRI brain:  Status post right occipital craniotomy for mass resection. Marked reduction in edema and mass effect, with a now normal appearance of the fourth ventricle. Blood products are present in the region of the approach and resection, with a small amount of adjacent enhancement as would be expected at this time. The study is indeterminate for residual marginal tumor and additional follow-up is warranted.   10/27/2023 Pathology Results   Caris:  PD-L1: Positive, TPS: 100%, 3+ TMB: High, 15 mutation per MB  ALK, EGFR, KRAS, MET, RET, ROS1: Negative   11/03/2023 Procedure   Port placement   11/12/2023 - 11/17/2023 Radiation Therapy   5 fractions of SBRT for R cerebellar bed   11/27/2023 - 01/01/2024 Chemotherapy   Patient is on Treatment Plan : LUNG Carboplatin  + Paclitaxel  + XRT q7d     02/05/2024 Imaging   MRI brain with and without contrast:  Impression  -Postsurgical changes of right cerebellar mass resection with further collapse of the resection cavity. Decreasing peripheral enhancement of the resection cavity  is favored postsurgical. -Multiple new enhancing supratentorial intracranial lesions and new nodular leptomeningeal enhancement in the left occipital lobe, concerning for  worsening metastases. -- Questioned enhancement in the right greater than left internal auditory canals, potentially additional sites of leptomeningeal disease.   02/07/2024 Imaging   CT CAP with contrast:  IMPRESSION: 1. New 10 mm subcapsular low-density lesion in the anterior left liver. 2. Spiculated pulmonary nodule posterior right upper lobe is slightly decreased in size in the interval. 3. Right hilar lymph node measured previously at 12 mm short axis is 6 mm short axis today. No mediastinal lymphadenopathy by CT size criteria.   02/16/2024 Imaging   MRI liver with and without contrast:  IMPRESSION: 1.4 cm rim-enhancing mass in segment 4B of the left hepatic lobe, highly suspicious for liver metastasis.   02/23/2024 -  Radiation Therapy   SBRT to the new brain metastasis   02/26/2024 - 02/26/2024 Chemotherapy   Patient is on Treatment Plan : LUNG NSCLC Pembrolizumab  (200) q21d     02/27/2024 -  Chemotherapy   Patient is on Treatment Plan : LUNG Carboplatin  (5) + Pemetrexed  (500) + Pembrolizumab  (200) D1 q21d Induction x 4 cycles / Maintenance Pemetrexed  (500) + Pembrolizumab  (200) D1 q21d         Current Treatment: Carboplatin , pemetrexed  and pembrolizumab   INTERVAL HISTORY:  Julie Horn is here today for follow up.    Patient got married recently and had her honeymoon in [place].  She is doing well.  She has seen neurology since her last visit for her headaches and was started on tramadol  which is helping her.  She also reported significant body pains from the G-CSF injection and does not want to get it with this treatment.  She has no other complaints today.  Overall is doing very well.   I have reviewed the past medical history, past surgical history, social history and family history with the patient and they are unchanged from previous note.  ALLERGIES:  is allergic to levofloxacin, sulfa antibiotics, azithromycin, doxycycline , latex, and wound dressing  adhesive.  MEDICATIONS:  Current Outpatient Medications  Medication Sig Dispense Refill   acetaminophen  (TYLENOL ) 500 MG tablet Take 1,000 mg by mouth every 6 (six) hours as needed for mild pain (pain score 1-3). Take 2 tablets (1,000 mg total) by mouth every six (6) hours.     Albuterol -Budesonide  (AIRSUPRA ) 90-80 MCG/ACT AERO Inhale 2 puffs into the lungs every 4 (four) hours as needed. 10.7 g 11   ascorbic acid (VITAMIN C) 500 MG tablet Take 500 mg by mouth daily. Take 1 tablet (500 mg total) by mouth daily     butalbital -acetaminophen -caffeine  (FIORICET) 50-325-40 MG tablet Take 1 tablet by mouth every 4 (four) hours as needed for headache. 14 tablet 1   co-enzyme Q-10 30 MG capsule Take 30 mg by mouth 3 (three) times daily. Take 1 capsule (30 mg total) by mouth Three (3) times a day.     Cyanocobalamin  (VITAMIN B 12) 500 MCG TABS Take 500 mg by mouth daily.     dexamethasone  (DECADRON ) 4 MG tablet Take twice daily for 3 days following each treatment 30 tablet 0   folic acid  (FOLVITE ) 1 MG tablet Take 1 tablet (1 mg total) by mouth daily. 30 tablet 6   guaiFENesin  (MUCINEX ) 600 MG 12 hr tablet Take 1,200 mg by mouth 2 (two) times daily. Take 1 tablet (1200 mg total) by mouth every twelve (12) hours.     hydrOXYzine  (VISTARIL )  25 MG capsule Take 1 capsule (25 mg total) by mouth 3 (three) times daily. For anxiety and sleep. 90 capsule 5   levothyroxine  (SYNTHROID ) 50 MCG tablet Take 1 tablet (50 mcg total) by mouth daily before breakfast. 30 tablet 1   magnesium oxide (MAG-OX) 400 (240 Mg) MG tablet Take 400 mg by mouth daily. Take 1 tablet (400 mg total) by mouth daily.     melatonin 1 MG TABS tablet Take 1 mg by mouth at bedtime. Take 5 tablets (5 mg total) by mouth nightly.     ondansetron  (ZOFRAN -ODT) 4 MG disintegrating tablet Take 4 mg by mouth every 8 (eight) hours as needed.     PACLitaxel  (TAXOL  IV) Inject 1 Units into the vein every 21 ( twenty-one) days.     pegfilgrastim -jmdb  (FULPHILA ) 6 MG/0.6ML injection Inject 6 mg into the skin every 21 ( twenty-one) days.     Pembrolizumab  (KEYTRUDA  IV) Inject 1 Units into the vein every 21 ( twenty-one) days.     PEMEtrexed  Disodium (ALIMTA  IV) Inject 1 Units into the vein every 21 ( twenty-one) days.     prochlorperazine  (COMPAZINE ) 10 MG tablet Take 10 mg by mouth every 6 (six) hours as needed for nausea or vomiting.     rizatriptan  (MAXALT -MLT) 10 MG disintegrating tablet Take 1 tablet (10 mg total) by mouth as needed for migraine. May repeat in 2 hours if needed 9 tablet 11   sennosides-docusate sodium (SENOKOT-S) 8.6-50 MG tablet Take 2 tablets by mouth daily.     topiramate  (TOPAMAX ) 25 MG tablet Take 1-2 tablets (25-50 mg total) by mouth 2 (two) times daily. 120 tablet 6   traMADol  (ULTRAM ) 50 MG tablet Take 1 tablet (50 mg total) by mouth every 12 (twelve) hours as needed for severe pain (pain score 7-10) (headaches). 30 tablet 0   traZODone  (DESYREL ) 150 MG tablet USE FROM 1/3 TO 1 TABLET NIGHTLY AS NEEDED FOR SLEEP. 90 tablet 1   No current facility-administered medications for this visit.   Facility-Administered Medications Ordered in Other Visits  Medication Dose Route Frequency Provider Last Rate Last Admin   0.9 %  sodium chloride  infusion   Intravenous Continuous Zaccheus Edmister, MD   Stopped at 04/17/24 1142    REVIEW OF SYSTEMS:   Constitutional: Denies fevers, chills or abnormal weight loss Eyes: Denies blurriness of vision Ears, nose, mouth, throat, and face: Denies mucositis or sore throat Respiratory: Denies cough, dyspnea or wheezes Cardiovascular: Denies palpitation, chest discomfort or lower extremity swelling Gastrointestinal:  Denies nausea, heartburn or change in bowel habits Skin: Denies abnormal skin rashes Lymphatics: Denies new lymphadenopathy or easy bruising Neurological:Denies numbness, tingling or new weaknesses Behavioral/Psych: Mood is stable, no new changes  All other systems were  reviewed with the patient and are negative.   VITALS:  Weight 140 lb 10.5 oz (63.8 kg), last menstrual period 07/26/2019.  Wt Readings from Last 3 Encounters:  04/17/24 140 lb 10.5 oz (63.8 kg)  04/08/24 139 lb (63 kg)  03/21/24 137 lb 12.8 oz (62.5 kg)    Body mass index is 24.14 kg/m.  Performance status (ECOG): 0 - Asymptomatic  PHYSICAL EXAM:   GENERAL:alert, no distress and comfortable SKIN: Port insertion surgical site clean.  LUNGS: clear to auscultation and percussion with normal breathing effort HEART: regular rate & rhythm and no murmurs and no lower extremity edema ABDOMEN:abdomen soft, non-tender and normal bowel sounds Musculoskeletal:no cyanosis of digits and no clubbing  NEURO: alert & oriented x 3  with fluent speech.  Postsurgical scar on the right occipital region-healed well with new hair growth.  LABORATORY DATA:  I have reviewed the data as listed  Lab Results  Component Value Date   WBC 3.0 (L) 04/17/2024   NEUTROABS 1.7 04/17/2024   HGB 12.4 04/17/2024   HCT 36.0 04/17/2024   MCV 98.4 04/17/2024   PLT 188 04/17/2024     Chemistry      Component Value Date/Time   NA 139 04/17/2024 0809   NA 145 (H) 05/22/2023 1054   K 3.7 04/17/2024 0809   CL 103 04/17/2024 0809   CO2 25 04/17/2024 0809   BUN 7 04/17/2024 0809   BUN 7 05/22/2023 1054   CREATININE 0.57 04/17/2024 0809   GLU 83 08/25/2015 0000      Component Value Date/Time   CALCIUM 9.2 04/17/2024 0809   ALKPHOS 63 04/17/2024 0809   AST 17 04/17/2024 0809   ALT 19 04/17/2024 0809   BILITOT 0.5 04/17/2024 0809   BILITOT 0.5 05/22/2023 1054      Latest Reference Range & Units 04/17/24 08:09  TSH 0.350 - 4.500 uIU/mL 35.221 (H)  (H): Data is abnormally high   RADIOGRAPHIC STUDIES: I reviewed the following image findings and agree with the report  None new to review

## 2024-04-17 NOTE — Assessment & Plan Note (Addendum)
 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 Patient completed gamma knife at [facility].  Currently on a clinical trial assessing gamma knife versus gamma tile Completed steroid taper. Recent MRI brain with multiple new brain metastasis.  Completed SBRT at [facility]  - Continue to follow with [facility]. - Next MRI brain scheduled for October 1st. Will follow up.

## 2024-04-17 NOTE — Assessment & Plan Note (Signed)
 Secondary to immunotherapy use TSH: 35.221 T4: Pending Patient is asymptomatic at this time  - Will start on levothyroxine  50 mcg to be taken daily -Will change the dosage based on response - Will recheck levels in 6 weeks

## 2024-04-18 LAB — T4: T4, Total: 3.7 ug/dL — ABNORMAL LOW (ref 4.5–12.0)

## 2024-04-19 ENCOUNTER — Inpatient Hospital Stay

## 2024-04-25 ENCOUNTER — Telehealth: Payer: Self-pay | Admitting: *Deleted

## 2024-04-25 NOTE — Telephone Encounter (Signed)
 She called and has felt like she is getting sick for 48 hours post immunotherapy, however has subsided until this time.  She has been symptomatic x 3 days, associated with cough, congestion, sore throat and sinus drainage.  Denies fever.  COVID test negative.  Per Dr. Davonna, likely viral with absence of fever.  Patient to maintain hydration, rest and notify us  if she develops fever.  Verbalized understanding.

## 2024-05-08 ENCOUNTER — Inpatient Hospital Stay

## 2024-05-08 ENCOUNTER — Inpatient Hospital Stay: Attending: Hematology

## 2024-05-08 ENCOUNTER — Inpatient Hospital Stay (HOSPITAL_BASED_OUTPATIENT_CLINIC_OR_DEPARTMENT_OTHER): Admitting: Oncology

## 2024-05-08 VITALS — BP 106/77 | HR 63 | Temp 97.6°F | Resp 17

## 2024-05-08 DIAGNOSIS — E032 Hypothyroidism due to medicaments and other exogenous substances: Secondary | ICD-10-CM

## 2024-05-08 DIAGNOSIS — Z5112 Encounter for antineoplastic immunotherapy: Secondary | ICD-10-CM | POA: Diagnosis present

## 2024-05-08 DIAGNOSIS — C787 Secondary malignant neoplasm of liver and intrahepatic bile duct: Secondary | ICD-10-CM | POA: Diagnosis not present

## 2024-05-08 DIAGNOSIS — Z5111 Encounter for antineoplastic chemotherapy: Secondary | ICD-10-CM | POA: Insufficient documentation

## 2024-05-08 DIAGNOSIS — Z79899 Other long term (current) drug therapy: Secondary | ICD-10-CM | POA: Diagnosis not present

## 2024-05-08 DIAGNOSIS — C7801 Secondary malignant neoplasm of right lung: Secondary | ICD-10-CM

## 2024-05-08 DIAGNOSIS — C7931 Secondary malignant neoplasm of brain: Secondary | ICD-10-CM | POA: Insufficient documentation

## 2024-05-08 DIAGNOSIS — F17210 Nicotine dependence, cigarettes, uncomplicated: Secondary | ICD-10-CM | POA: Insufficient documentation

## 2024-05-08 DIAGNOSIS — C3411 Malignant neoplasm of upper lobe, right bronchus or lung: Secondary | ICD-10-CM | POA: Diagnosis not present

## 2024-05-08 LAB — CBC WITH DIFFERENTIAL/PLATELET
Abs Immature Granulocytes: 0 K/uL (ref 0.00–0.07)
Basophils Absolute: 0 K/uL (ref 0.0–0.1)
Basophils Relative: 1 %
Eosinophils Absolute: 0.2 K/uL (ref 0.0–0.5)
Eosinophils Relative: 7 %
HCT: 36 % (ref 36.0–46.0)
Hemoglobin: 12.8 g/dL (ref 12.0–15.0)
Immature Granulocytes: 0 %
Lymphocytes Relative: 19 %
Lymphs Abs: 0.5 K/uL — ABNORMAL LOW (ref 0.7–4.0)
MCH: 34.4 pg — ABNORMAL HIGH (ref 26.0–34.0)
MCHC: 35.6 g/dL (ref 30.0–36.0)
MCV: 96.8 fL (ref 80.0–100.0)
Monocytes Absolute: 0.3 K/uL (ref 0.1–1.0)
Monocytes Relative: 11 %
Neutro Abs: 1.8 K/uL (ref 1.7–7.7)
Neutrophils Relative %: 62 %
Platelets: 167 K/uL (ref 150–400)
RBC: 3.72 MIL/uL — ABNORMAL LOW (ref 3.87–5.11)
RDW: 13.8 % (ref 11.5–15.5)
WBC: 2.8 K/uL — ABNORMAL LOW (ref 4.0–10.5)
nRBC: 0 % (ref 0.0–0.2)

## 2024-05-08 LAB — COMPREHENSIVE METABOLIC PANEL WITH GFR
ALT: 40 U/L (ref 0–44)
AST: 26 U/L (ref 15–41)
Albumin: 4.6 g/dL (ref 3.5–5.0)
Alkaline Phosphatase: 70 U/L (ref 38–126)
Anion gap: 11 (ref 5–15)
BUN: 10 mg/dL (ref 6–20)
CO2: 25 mmol/L (ref 22–32)
Calcium: 9.7 mg/dL (ref 8.9–10.3)
Chloride: 104 mmol/L (ref 98–111)
Creatinine, Ser: 0.59 mg/dL (ref 0.44–1.00)
GFR, Estimated: >60 mL/min (ref >60)
Glucose, Bld: 120 mg/dL — ABNORMAL HIGH (ref 70–99)
Potassium: 3.9 mmol/L (ref 3.5–5.1)
Sodium: 140 mmol/L (ref 135–145)
Total Bilirubin: <0.2 mg/dL (ref 0.0–1.2)
Total Protein: 6.8 g/dL (ref 6.5–8.1)

## 2024-05-08 LAB — MAGNESIUM: Magnesium: 2.2 mg/dL (ref 1.7–2.4)

## 2024-05-08 MED ORDER — SODIUM CHLORIDE 0.9 % IV SOLN
150.0000 mg | Freq: Once | INTRAVENOUS | Status: AC
  Administered 2024-05-08: 150 mg via INTRAVENOUS
  Filled 2024-05-08: qty 150

## 2024-05-08 MED ORDER — PALONOSETRON HCL INJECTION 0.25 MG/5ML
0.2500 mg | Freq: Once | INTRAVENOUS | Status: AC
  Administered 2024-05-08: 0.25 mg via INTRAVENOUS
  Filled 2024-05-08: qty 5

## 2024-05-08 MED ORDER — SODIUM CHLORIDE 0.9 % IV SOLN: INTRAVENOUS | Status: DC

## 2024-05-08 MED ORDER — CETIRIZINE HCL 10 MG PO TABS
10.0000 mg | ORAL_TABLET | Freq: Every day | ORAL | Status: DC
  Administered 2024-05-08: 10 mg via ORAL
  Filled 2024-05-08: qty 1

## 2024-05-08 MED ORDER — SODIUM CHLORIDE 0.9 % IV SOLN
500.0000 mg/m2 | Freq: Once | INTRAVENOUS | Status: AC
  Administered 2024-05-08: 900 mg via INTRAVENOUS
  Filled 2024-05-08: qty 20

## 2024-05-08 MED ORDER — SODIUM CHLORIDE 0.9 % IV SOLN
570.0000 mg | Freq: Once | INTRAVENOUS | Status: AC
  Administered 2024-05-08: 570 mg via INTRAVENOUS
  Filled 2024-05-08: qty 57

## 2024-05-08 MED ORDER — SODIUM CHLORIDE 0.9 % IV SOLN
200.0000 mg | Freq: Once | INTRAVENOUS | Status: AC
  Administered 2024-05-08: 200 mg via INTRAVENOUS
  Filled 2024-05-08: qty 8

## 2024-05-08 MED ORDER — DEXAMETHASONE SOD PHOSPHATE PF 10 MG/ML IJ SOLN
10.0000 mg | Freq: Once | INTRAMUSCULAR | Status: AC
  Administered 2024-05-08: 10 mg via INTRAVENOUS

## 2024-05-08 MED ORDER — FAMOTIDINE IN NACL 20-0.9 MG/50ML-% IV SOLN
20.0000 mg | Freq: Once | INTRAVENOUS | Status: AC
  Administered 2024-05-08: 20 mg via INTRAVENOUS
  Filled 2024-05-08: qty 50

## 2024-05-08 NOTE — Patient Instructions (Signed)
 [facility] - A DEPT OF [facility]  Discharge Instructions: Thank you for choosing [facility] to provide your oncology and hematology care.  If you have a lab appointment with the Cancer Center - please note that after April 8th, 2024, all labs will be drawn in the cancer center.  You do not have to check in or register with the main entrance as you have in the past but will complete your check-in in the cancer center.  Wear comfortable clothing and clothing appropriate for easy access to any Portacath or PICC line.   We strive to give you quality time with your provider. You may need to reschedule your appointment if you arrive late (15 or more minutes).  Arriving late affects you and other patients whose appointments are after yours.  Also, if you miss three or more appointments without notifying the office, you may be dismissed from the clinic at the provider's discretion.      For prescription refill requests, have your pharmacy contact our office and allow 72 hours for refills to be completed.    Today you received the following chemotherapy and/or immunotherapy agents Keytruda , Alimta , carboplatin .  Carboplatin  Injection What is this medication? CARBOPLATIN  (KAR boe pla tin) treats some types of cancer. It works by slowing down the growth of cancer cells. This medicine may be used for other purposes; ask your health care provider or pharmacist if you have questions. COMMON BRAND NAME(S): Paraplatin  What should I tell my care team before I take this medication? They need to know if you have any of these conditions: Blood disorders Hearing problems Kidney disease Recent or ongoing radiation therapy An unusual or allergic reaction to carboplatin , cisplatin, other medications, foods, dyes, or preservatives Pregnant or trying to get pregnant Breast-feeding How should I use this medication? This medication is injected into a vein. It is given  by your care team in a hospital or clinic setting. Talk to your care team about the use of this medication in children. Special care may be needed. Overdosage: If you think you have taken too much of this medicine contact a poison control center or emergency room at once. NOTE: This medicine is only for you. Do not share this medicine with others. What if I miss a dose? Keep appointments for follow-up doses. It is important not to miss your dose. Call your care team if you are unable to keep an appointment. What may interact with this medication? Medications for seizures Some antibiotics, such as amikacin, gentamicin, neomycin, streptomycin, tobramycin Vaccines This list may not describe all possible interactions. Give your health care provider a list of all the medicines, herbs, non-prescription drugs, or dietary supplements you use. Also tell them if you smoke, drink alcohol, or use illegal drugs. Some items may interact with your medicine. What should I watch for while using this medication? Your condition will be monitored carefully while you are receiving this medication. You may need blood work while taking this medication. This medication may make you feel generally unwell. This is not uncommon, as chemotherapy can affect healthy cells as well as cancer cells. Report any side effects. Continue your course of treatment even though you feel ill unless your care team tells you to stop. In some cases, you may be given additional medications to help with side effects. Follow all directions for their use. This medication may increase your risk of getting an infection. Call your care team for  advice if you get a fever, chills, sore throat, or other symptoms of a cold or flu. Do not treat yourself. Try to avoid being around people who are sick. Avoid taking medications that contain aspirin, acetaminophen , ibuprofen, naproxen, or ketoprofen unless instructed by your care team. These medications may  hide a fever. Be careful brushing or flossing your teeth or using a toothpick because you may get an infection or bleed more easily. If you have any dental work done, tell your dentist you are receiving this medication. Talk to your care team if you wish to become pregnant or think you might be pregnant. This medication can cause serious birth defects. Talk to your care team about effective forms of contraception. Do not breast-feed while taking this medication. What side effects may I notice from receiving this medication? Side effects that you should report to your care team as soon as possible: Allergic reactions--skin rash, itching, hives, swelling of the face, lips, tongue, or throat Infection--fever, chills, cough, sore throat, wounds that don't heal, pain or trouble when passing urine, general feeling of discomfort or being unwell Low red blood cell level--unusual weakness or fatigue, dizziness, headache, trouble breathing Pain, tingling, or numbness in the hands or feet, muscle weakness, change in vision, confusion or trouble speaking, loss of balance or coordination, trouble walking, seizures Unusual bruising or bleeding Side effects that usually do not require medical attention (report to your care team if they continue or are bothersome): Hair loss Nausea Unusual weakness or fatigue Vomiting This list may not describe all possible side effects. Call your doctor for medical advice about side effects. You may report side effects to FDA at 1-800-FDA-1088. Where should I keep my medication? This medication is given in a hospital or clinic. It will not be stored at home. NOTE: This sheet is a summary. It may not cover all possible information. If you have questions about this medicine, talk to your doctor, pharmacist, or health care provider.  2024 Elsevier/Gold Standard (2021-11-02 00:00:00)Pemetrexed  Injection What is this medication? PEMETREXED  (PEM e TREX ed) treats some types of  cancer. It works by slowing down the growth of cancer cells. This medicine may be used for other purposes; ask your health care provider or pharmacist if you have questions. COMMON BRAND NAME(S): Alimta , PEMFEXY, PEMRYDI RTU What should I tell my care team before I take this medication? They need to know if you have any of these conditions: Infection, such as chickenpox, cold sores, or herpes Kidney disease Low blood cell levels (white cells, red cells, and platelets) Lung or breathing disease, such as asthma Radiation therapy An unusual or allergic reaction to pemetrexed , other medications, foods, dyes, or preservatives If you or your partner are pregnant or trying to get pregnant Breast-feeding How should I use this medication? This medication is injected into a vein. It is given by your care team in a hospital or clinic setting. Talk to your care team about the use of this medication in children. Special care may be needed. Overdosage: If you think you have taken too much of this medicine contact a poison control center or emergency room at once. NOTE: This medicine is only for you. Do not share this medicine with others. What if I miss a dose? Keep appointments for follow-up doses. It is important not to miss your dose. Call your care team if you are unable to keep an appointment. What may interact with this medication? Do not take this medication with any of the  following: Live virus vaccines This medication may also interact with the following: Ibuprofen This list may not describe all possible interactions. Give your health care provider a list of all the medicines, herbs, non-prescription drugs, or dietary supplements you use. Also tell them if you smoke, drink alcohol, or use illegal drugs. Some items may interact with your medicine. What should I watch for while using this medication? Your condition will be monitored carefully while you are receiving this medication. This  medication may make you feel generally unwell. This is not uncommon as chemotherapy can affect healthy cells as well as cancer cells. Report any side effects. Continue your course of treatment even though you feel ill unless your care team tells you to stop. This medication can cause serious side effects. To reduce the risk, your care team may give you other medications to take before receiving this one. Be sure to follow the directions from your care team. This medication can cause a rash or redness in areas of the body that have previously had radiation therapy. If you have had radiation therapy, tell your care team if you notice a rash in this area. This medication may increase your risk of getting an infection. Call your care team for advice if you get a fever, chills, sore throat, or other symptoms of a cold or flu. Do not treat yourself. Try to avoid being around people who are sick. Be careful brushing or flossing your teeth or using a toothpick because you may get an infection or bleed more easily. If you have any dental work done, tell your dentist you are receiving this medication. Avoid taking medications that contain aspirin, acetaminophen , ibuprofen, naproxen, or ketoprofen unless instructed by your care team. These medications may hide a fever. Check with your care team if you have severe diarrhea, nausea, and vomiting, or if you sweat a lot. The loss of too much body fluid may make it dangerous for you to take this medication. Talk to your care team if you or your partner wish to become pregnant or think either of you might be pregnant. This medication can cause serious birth defects if taken during pregnancy and for 6 months after the last dose. A negative pregnancy test is required before starting this medication. A reliable form of contraception is recommended while taking this medication and for 6 months after the last dose. Talk to your care team about reliable forms of contraception. Do  not father a child while taking this medication and for 3 months after the last dose. Use a condom while having sex during this time period. Do not breastfeed while taking this medication and for 1 week after the last dose. This medication may cause infertility. Talk to your care team if you are concerned about your fertility. What side effects may I notice from receiving this medication? Side effects that you should report to your care team as soon as possible: Allergic reactions--skin rash, itching, hives, swelling of the face, lips, tongue, or throat Dry cough, shortness of breath or trouble breathing Infection--fever, chills, cough, sore throat, wounds that don't heal, pain or trouble when passing urine, general feeling of discomfort or being unwell Kidney injury--decrease in the amount of urine, swelling of the ankles, hands, or feet Low red blood cell level--unusual weakness or fatigue, dizziness, headache, trouble breathing Redness, blistering, peeling, or loosening of the skin, including inside the mouth Unusual bruising or bleeding Side effects that usually do not require medical attention (report to your care  team if they continue or are bothersome): Fatigue Loss of appetite Nausea Vomiting This list may not describe all possible side effects. Call your doctor for medical advice about side effects. You may report side effects to FDA at 1-800-FDA-1088. Where should I keep my medication? This medication is given in a hospital or clinic. It will not be stored at home. NOTE: This sheet is a summary. It may not cover all possible information. If you have questions about this medicine, talk to your doctor, pharmacist, or health care provider.  2024 Elsevier/Gold Standard (2021-11-16 00:00:00)Pembrolizumab  Injection What is this medication? PEMBROLIZUMAB  (PEM broe LIZ ue mab) treats some types of cancer. It works by helping your immune system slow or stop the spread of cancer cells. It is  a monoclonal antibody. This medicine may be used for other purposes; ask your health care provider or pharmacist if you have questions. COMMON BRAND NAME(S): Keytruda  What should I tell my care team before I take this medication? They need to know if you have any of these conditions: Allogeneic stem cell transplant (uses someone else's stem cells) Autoimmune diseases, such as Crohn disease, ulcerative colitis, lupus History of chest radiation Nervous system problems, such as Guillain-Barre syndrome, myasthenia gravis Organ transplant An unusual or allergic reaction to pembrolizumab , other medications, foods, dyes, or preservatives Pregnant or trying to get pregnant Breast-feeding How should I use this medication? This medication is injected into a vein. It is given by your care team in a hospital or clinic setting. A special MedGuide will be given to you before each treatment. Be sure to read this information carefully each time. Talk to your care team about the use of this medication in children. While it may be prescribed for children as young as 6 months for selected conditions, precautions do apply. Overdosage: If you think you have taken too much of this medicine contact a poison control center or emergency room at once. NOTE: This medicine is only for you. Do not share this medicine with others. What if I miss a dose? Keep appointments for follow-up doses. It is important not to miss your dose. Call your care team if you are unable to keep an appointment. What may interact with this medication? Interactions have not been studied. This list may not describe all possible interactions. Give your health care provider a list of all the medicines, herbs, non-prescription drugs, or dietary supplements you use. Also tell them if you smoke, drink alcohol, or use illegal drugs. Some items may interact with your medicine. What should I watch for while using this medication? Your condition will be  monitored carefully while you are receiving this medication. You may need blood work while taking this medication. This medication may cause serious skin reactions. They can happen weeks to months after starting the medication. Contact your care team right away if you notice fevers or flu-like symptoms with a rash. The rash may be red or purple and then turn into blisters or peeling of the skin. You may also notice a red rash with swelling of the face, lips, or lymph nodes in your neck or under your arms. Tell your care team right away if you have any change in your eyesight. Talk to your care team if you may be pregnant. Serious birth defects can occur if you take this medication during pregnancy and for 4 months after the last dose. You will need a negative pregnancy test before starting this medication. Contraception is recommended while taking this medication  and for 4 months after the last dose. Your care team can help you find the option that works for you. Do not breastfeed while taking this medication and for 4 months after the last dose. What side effects may I notice from receiving this medication? Side effects that you should report to your care team as soon as possible: Allergic reactions--skin rash, itching, hives, swelling of the face, lips, tongue, or throat Dry cough, shortness of breath or trouble breathing Eye pain, redness, irritation, or discharge with blurry or decreased vision Heart muscle inflammation--unusual weakness or fatigue, shortness of breath, chest pain, fast or irregular heartbeat, dizziness, swelling of the ankles, feet, or hands Hormone gland problems--headache, sensitivity to light, unusual weakness or fatigue, dizziness, fast or irregular heartbeat, increased sensitivity to cold or heat, excessive sweating, constipation, hair loss, increased thirst or amount of urine, tremors or shaking, irritability Infusion reactions--chest pain, shortness of breath or trouble  breathing, feeling faint or lightheaded Kidney injury (glomerulonephritis)--decrease in the amount of urine, red or dark brown urine, foamy or bubbly urine, swelling of the ankles, hands, or feet Liver injury--right upper belly pain, loss of appetite, nausea, light-colored stool, dark yellow or brown urine, yellowing skin or eyes, unusual weakness or fatigue Pain, tingling, or numbness in the hands or feet, muscle weakness, change in vision, confusion or trouble speaking, loss of balance or coordination, trouble walking, seizures Rash, fever, and swollen lymph nodes Redness, blistering, peeling, or loosening of the skin, including inside the mouth Sudden or severe stomach pain, bloody diarrhea, fever, nausea, vomiting Side effects that usually do not require medical attention (report to your care team if they continue or are bothersome): Bone, joint, or muscle pain Diarrhea Fatigue Loss of appetite Nausea Skin rash This list may not describe all possible side effects. Call your doctor for medical advice about side effects. You may report side effects to FDA at 1-800-FDA-1088. Where should I keep my medication? This medication is given in a hospital or clinic. It will not be stored at home. NOTE: This sheet is a summary. It may not cover all possible information. If you have questions about this medicine, talk to your doctor, pharmacist, or health care provider.  2024 Elsevier/Gold Standard (2021-11-23 00:00:00)      To help prevent nausea and vomiting after your treatment, we encourage you to take your nausea medication as directed.  BELOW ARE SYMPTOMS THAT SHOULD BE REPORTED IMMEDIATELY: *FEVER GREATER THAN 100.4 F (38 C) OR HIGHER *CHILLS OR SWEATING *NAUSEA AND VOMITING THAT IS NOT CONTROLLED WITH YOUR NAUSEA MEDICATION *UNUSUAL SHORTNESS OF BREATH *UNUSUAL BRUISING OR BLEEDING *URINARY PROBLEMS (pain or burning when urinating, or frequent urination) *BOWEL PROBLEMS (unusual  diarrhea, constipation, pain near the anus) TENDERNESS IN MOUTH AND THROAT WITH OR WITHOUT PRESENCE OF ULCERS (sore throat, sores in mouth, or a toothache) UNUSUAL RASH, SWELLING OR PAIN  UNUSUAL VAGINAL DISCHARGE OR ITCHING   Items with * indicate a potential emergency and should be followed up as soon as possible or go to the Emergency Department if any problems should occur.  Please show the CHEMOTHERAPY ALERT CARD or IMMUNOTHERAPY ALERT CARD at check-in to the Emergency Department and triage nurse.  Should you have questions after your visit or need to cancel or reschedule your appointment, please contact [facility] [place] - A DEPT OF [facility] [phone]  and follow the prompts.  Office hours are 8:00 a.m. to 4:30 p.m. Monday - Friday. Please note that  voicemails left after 4:00 p.m. may not be returned until the following business day.  We are closed weekends and major holidays. You have access to a nurse at all times for urgent questions. Please call the main number to the clinic [phone] and follow the prompts.  For any non-urgent questions, you may also contact your provider using MyChart. We now offer e-Visits for anyone 31 and older to request care online for non-urgent symptoms. For details visit [email or web address].   Also download the MyChart app! Go to the app store, search MyChart, open the app, select [place], and log in with your MyChart username and password.

## 2024-05-08 NOTE — Progress Notes (Signed)
 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   S/p cerebellar lesion resection consistent with lung primary.   Oncology history as below. PET scan showed limited disease to lung and lymph nodes.  Discussed at tumor board, surgery is not a feasible option secondary to lymph node involvement.  Recommended chemoradiation. Caris: PD-L1: 100% Completed chemoRT on 01/08/2024 with carboplatin  and paclitaxel  MRI brain with new sites of metastasis and CT scan within evidence of new liver lesion with response locally in the lung. MRI liver with confirmation of liver metastasis. Discussion at the thoracic tumor board, and started on chemoimmunotherapy on 02/28/2024: Started on carboplatin , pemetrexed  and pembrolizumab .   MRI from 04/24/2024 showed low-volume progression in the brain with punctuate lesions I was previously reviewed in neuro tumor board and not felt to represent leptomeningeal disease.  Surgical site without evidence of recurrence.  Clinically she does not exhibit symptoms consistent with LMD. Discussion with tumor board at [facility] recommended follow-up in 6 months with MRI RS to untreated punctuate lesions given very small size and possibility that chemo can stabilize these.  Some of the new lesions today and today's MRI report were treated with last CK treatment.  Discussion confirm suspicion for LMD remains low.  -Cycle 4-day 1 today.  Patient tolerated previous cycle well with no significant side effects - Labs reviewed today: (05/08/2024) normal CMP, WBC 2.8, hemoglobin 12.8, platelets 167.  ANC 1800. -Physical exam stable.  Okay to continue with treatment today. - Continue to hold G-CSF the last cycle. -Patient is scheduled for CT CAP on 05/20/2024 and follow-up with Dr. Davonna on 05/29/2024 Keytruda ,  Alimta  and [place]. -Will repeat TSH and T4 every 6 weeks.   Metastasis to brain [facility]) Cerebellar lesion s/p resection consistent with the poorly differentiated adenocarcinoma of lung primary. Patient completed gamma knife at [facility].  Currently on a clinical trial assessing gamma knife versus gamma tile Completed steroid taper. MRI brain with multiple new brain metastasis.  Completed SBRT at [facility]. MRI from 04/24/2024 showed low-volume progression in the brain with punctuate lesions I was previously reviewed in neuro tumor board and not felt to represent leptomeningeal disease.  Surgical site without evidence of recurrence.  Clinically she does not exhibit symptoms consistent with LMD. Discussion with tumor board at [facility] recommended follow-up in 6 months with MRI RS to untreated punctuate lesions given very small size and possibility that chemo can stabilize these.  Some of the new lesions today and today's MRI report were treated with last CK treatment.  Discussion confirm suspicion for LMD remains low. Next MRI due in 6 weeks.   - Continue to follow with [facility]. - Next MRI brain scheduled mid November.  Metastasis to liver [facility]) Recent PET scan with evidence of liver metastasis confirmed by MRI  - Discussed risk versus benefits of liver biopsy.  Patient does not want to do liver biopsy at this time.  Stated that she would consider at a later point if she does not respond to treatment. 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   INTERVAL HISTORY: Patient returns for follow-up.  Reports overall doing well.  He has noted to feel slightly more congested over the past month or so.   Reports some days she has a lot of clear thin nasal drainage and some days she has none.  She has not tried anything over-the-counter except for Zyrtec  which is not helping.  She is wondering if she can take a decongestant.  Her appetite is 50% energy levels are 25%.  She has shortness of breath at times.  The constipation intermittently.  Headaches are less frequent.  She has been taking Synthroid  for her elevated thyroid  levels and has noted her hair loss is less significant.  Reports she just recently had an MRI and it was more or less stable.  We reviewed CBC, CMP and magnesium.  SUMMARY OF HEMATOLOGIC HISTORY: Oncology History  Metastatic lung carcinoma, right (HCC)  09/25/2023 Imaging   CT CAP w contrast:  1. Spiculated mass in the posterior right pulmonary apex measuring 2.6 x 1.4 cm, consistent with primary bronchogenic malignancy. 2. Enlarged right hilar lymph node measuring 1.5 x 1.2 cm, consistent with nodal metastatic disease. 3. Tiny hypodensities in the central liver, incompletely characterized, although statistically likely benign cysts or hemangiomas. Small metastases not strictly excluded. 4. Soft tissue nodule within the right gluteal subcutaneous fat;given location most likely an injection granuloma, however subcutaneous soft tissue metastases are occasionally seen in metastatic lung malignancy. Attention on follow-up.     09/25/2023 Imaging   MRI brain w wo contrast:  1. Heterogeneously contrast-enhancing mass of the right cerebellar hemisphere with a large amount of surrounding vasogenic edema. Metastasis and hemangioblastoma are the most common infratentorial tumors in adults. Astrocytoma is another possibility. 2. Mass effect on the fourth ventricle without hydrocephalus.   09/27/2023 Initial Diagnosis   Metastatic lung carcinoma, right (HCC)   10/05/2023 PET scan   IMPRESSION: 1. Hypermetabolic RIGHT upper lobe lung nodule consistent primary bronchogenic carcinoma. 2.  Mildly hypermetabolic RIGHT lower paratracheal and RIGHT hilar lymph nodes are concerning for nodal metastasis. No contralateral hypermetabolic lymph nodes. 3. No evidence of distant metastatic disease. 4. Intense activity through the distal anal canal is favored physiologic. Recommend digital rectal exam.   10/11/2023 Procedure   Resection of cerebellar metastasis   10/11/2023 Pathology Results   Diagnosis   A: Brain tumor, biopsy - Metastatic poorly differentiated adenocarcinoma consistent with lung primary.   B: Brain tumor, right, resection - Metastatic poorly differentiated adenocarcinoma consistent with lung primary.   C: Brain tumor, right, specimen trap - Metastatic poorly differentiated adenocarcinoma consistent with lung primary.     10/26/2023 Imaging   MRI brain:  Status post right occipital craniotomy for mass resection. Marked reduction in edema and mass effect, with a now normal appearance of the fourth ventricle. Blood products are present in the region of the approach and resection, with a small amount of adjacent enhancement as would be expected at this time. The study is indeterminate for residual marginal tumor and additional follow-up is warranted.   10/27/2023 Pathology Results   Caris:  PD-L1: Positive, TPS: 100%, 3+ TMB: High, 15 mutation per MB  ALK, EGFR, KRAS, MET, RET, ROS1: Negative   11/03/2023 Procedure   Port placement   11/12/2023 - 11/17/2023 Radiation Therapy   5 fractions of SBRT for R cerebellar bed   11/27/2023 - 01/01/2024 Chemotherapy   Patient is on Treatment Plan : LUNG Carboplatin  +  Paclitaxel  + XRT q7d     02/05/2024 Imaging   MRI brain with and without contrast:  Impression  -Postsurgical changes of right cerebellar mass resection with further collapse of the resection cavity. Decreasing peripheral enhancement of the resection cavity is favored postsurgical. -Multiple new enhancing supratentorial intracranial lesions and new nodular  leptomeningeal enhancement in the left occipital lobe, concerning for worsening metastases. -- Questioned enhancement in the right greater than left internal auditory canals, potentially additional sites of leptomeningeal disease.   02/07/2024 Imaging   CT CAP with contrast:  IMPRESSION: 1. New 10 mm subcapsular low-density lesion in the anterior left liver. 2. Spiculated pulmonary nodule posterior right upper lobe is slightly decreased in size in the interval. 3. Right hilar lymph node measured previously at 12 mm short axis is 6 mm short axis today. No mediastinal lymphadenopathy by CT size criteria.   02/16/2024 Imaging   MRI liver with and without contrast:  IMPRESSION: 1.4 cm rim-enhancing mass in segment 4B of the left hepatic lobe, highly suspicious for liver metastasis.   02/23/2024 -  Radiation Therapy   SBRT to the new brain metastasis   02/26/2024 - 02/26/2024 Chemotherapy   Patient is on Treatment Plan : LUNG NSCLC Pembrolizumab  (200) q21d     02/27/2024 -  Chemotherapy   Patient is on Treatment Plan : LUNG Carboplatin  (5) + Pemetrexed  (500) + Pembrolizumab  (200) D1 q21d Induction x 4 cycles / Maintenance Pemetrexed  (500) + Pembrolizumab  (200) D1 q21d        CBC    Component Value Date/Time   WBC 2.8 (L) 05/08/2024 1221   RBC 3.72 (L) 05/08/2024 1221   HGB 12.8 05/08/2024 1221   HGB 15.6 05/22/2023 1054   HCT 36.0 05/08/2024 1221   HCT 46.7 (H) 05/22/2023 1054   PLT 167 05/08/2024 1221   PLT 231 05/22/2023 1054   MCV 96.8 05/08/2024 1221   MCV 97 05/22/2023 1054   MCH 34.4 (H) 05/08/2024 1221   MCHC 35.6 05/08/2024 1221   RDW 13.8 05/08/2024 1221   RDW 12.9 05/22/2023 1054   LYMPHSABS 0.5 (L) 05/08/2024 1221   LYMPHSABS 1.3 05/22/2023 1054   MONOABS 0.3 05/08/2024 1221   EOSABS 0.2 05/08/2024 1221   EOSABS 0.2 05/22/2023 1054   BASOSABS 0.0 05/08/2024 1221   BASOSABS 0.1 05/22/2023 1054       Latest Ref Rng & Units 05/08/2024   12:21 PM 04/17/2024    8:09 AM  03/20/2024    8:10 AM  CMP  Glucose 70 - 99 mg/dL 879  95  98   BUN 6 - 20 mg/dL 10  7  11    Creatinine 0.44 - 1.00 mg/dL 9.40  9.42  9.47   Sodium 135 - 145 mmol/L 140  139  139   Potassium 3.5 - 5.1 mmol/L 3.9  3.7  3.6   Chloride 98 - 111 mmol/L 104  103  104   CO2 22 - 32 mmol/L 25  25  25    Calcium 8.9 - 10.3 mg/dL 9.7  9.2  9.4   Total Protein 6.5 - 8.1 g/dL 6.8  6.4  6.8   Total Bilirubin 0.0 - 1.2 mg/dL <9.7  0.5  0.6   Alkaline Phos 38 - 126 U/L 70  63  62   AST 15 - 41 U/L 26  17  20    ALT 0 - 44 U/L 40  19  24      No results found for: FERRITIN, VITAMINB12  There were no vitals filed for this visit.  Review of System:  Review of Systems  Constitutional:  Positive for malaise/fatigue.  Respiratory:  Positive for shortness of breath.   Gastrointestinal:  Positive for constipation.  Neurological:  Positive for headaches.    Physical Exam: Physical Exam Constitutional:      Appearance: Normal appearance.  HENT:     Head: Normocephalic and atraumatic.  Eyes:     Pupils: Pupils are equal, round, and reactive to light.  Cardiovascular:     Rate and Rhythm: Normal rate and regular rhythm.     Heart sounds: Normal heart sounds. No murmur heard. Pulmonary:     Effort: Pulmonary effort is normal.     Breath sounds: Normal breath sounds. No wheezing.  Abdominal:     General: Bowel sounds are normal. There is no distension.     Palpations: Abdomen is soft.     Tenderness: There is no abdominal tenderness.  Musculoskeletal:        General: Normal range of motion.     Cervical back: Normal range of motion.  Skin:    General: Skin is warm and dry.     Findings: No rash.  Neurological:     Mental Status: She is alert and oriented to person, place, and time.     Gait: Gait is intact.  Psychiatric:        Mood and Affect: Mood and affect normal.        Cognition and Memory: Memory normal.        Judgment: Judgment normal.      I spent 25 minutes dedicated  to the care of this patient (face-to-face and non-face-to-face) on the date of the encounter to include what is described in the assessment and plan.,  Delon Hope, NP 05/08/2024 2:00 PM

## 2024-05-08 NOTE — Assessment & Plan Note (Addendum)
 Secondary to immunotherapy use TSH: 35.221 T4: 3.4 Patient is asymptomatic at this time  - Will start on levothyroxine  50 mcg to be taken daily -Will change the dosage based on response - Will recheck levels in 6 weeks

## 2024-05-08 NOTE — Assessment & Plan Note (Addendum)
 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 Patient completed gamma knife at [facility].  Currently on a clinical trial assessing gamma knife versus gamma tile Completed steroid taper. MRI brain with multiple new brain metastasis.  Completed SBRT at [facility]. MRI from 04/24/2024 showed low-volume progression in the brain with punctuate lesions I was previously reviewed in neuro tumor board and not felt to represent leptomeningeal disease.  Surgical site without evidence of recurrence.  Clinically she does not exhibit symptoms consistent with LMD. Discussion with tumor board at [facility] recommended follow-up in 6 months with MRI RS to untreated punctuate lesions given very small size and possibility that chemo can stabilize these.  Some of the new lesions today and today's MRI report were treated with last CK treatment.  Discussion confirm suspicion for LMD remains low. Next MRI due in 6 weeks.   - Continue to follow with [facility]. - Next MRI brain scheduled mid November.

## 2024-05-08 NOTE — Assessment & Plan Note (Addendum)
 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   S/p cerebellar lesion resection consistent with lung primary.   Oncology history as below. PET scan showed limited disease to lung and lymph nodes.  Discussed at tumor board, surgery is not a feasible option secondary to lymph node involvement.  Recommended chemoradiation. Caris: PD-L1: 100% Completed chemoRT on 01/08/2024 with carboplatin  and paclitaxel  MRI brain with new sites of metastasis and CT scan within evidence of new liver lesion with response locally in the lung. MRI liver with confirmation of liver metastasis. Discussion at the thoracic tumor board, and started on chemoimmunotherapy on 02/28/2024: Started on carboplatin , pemetrexed  and pembrolizumab .   MRI from 04/24/2024 showed low-volume progression in the brain with punctuate lesions I was previously reviewed in neuro tumor board and not felt to represent leptomeningeal disease.  Surgical site without evidence of recurrence.  Clinically she does not exhibit symptoms consistent with LMD. Discussion with tumor board at [facility] recommended follow-up in 6 months with MRI RS to untreated punctuate lesions given very small size and possibility that chemo can stabilize these.  Some of the new lesions today and today's MRI report were treated with last CK treatment.  Discussion confirm suspicion for LMD remains low.  -Cycle 4-day 1 today.  Patient tolerated previous cycle well with no significant side effects - Labs reviewed today: (05/08/2024) normal CMP, WBC 2.8, hemoglobin 12.8, platelets 167.  ANC 1800. -Physical exam stable.  Okay to continue with treatment today. - Continue to hold G-CSF the last cycle. -Patient is scheduled for CT CAP on 05/20/2024 and follow-up with Dr. Davonna on 05/29/2024 Keytruda , Alimta  and [place]. -Will repeat TSH and T4 every 6 weeks.

## 2024-05-08 NOTE — Progress Notes (Signed)
   05/08/24 1500  Spiritual Encounters  Type of Visit Follow up  Care provided to: Patient   Reason for Visit: Chaplain making scheduled follow-up with Pt I previously connected with.  Description of Visit: I entered the room and found [facility] sitting in the chair receiving her treatment, with no one there as a support person.    [name] reports that while she does feel tired from the treatment, things have been going well for her.  She reports that home life is good- she is enjoying her new marriage.  No new needs have presented themselves.  Plan of Care: I will continue to follow [name] on a monthly basis.   [name], MDiv  Chaplain, [facility] [name] [phone]

## 2024-05-08 NOTE — Addendum Note (Signed)
 Addended by: Angelys Yetman on: 05/08/2024 04:26 PM   Modules accepted: Level of Service

## 2024-05-08 NOTE — Assessment & Plan Note (Addendum)
 Recent PET scan with evidence of liver metastasis confirmed by MRI  - Discussed risk versus benefits of liver biopsy.  Patient does not want to do liver biopsy at this time.  Stated that she would consider at a later point if she does not respond to treatment. - Will continue to monitor this on PET scan

## 2024-05-08 NOTE — Progress Notes (Signed)
 Patient presents today for Keytruda , Alimta , Carboplatin  and follow up with J.Burns NP. Vital signs and lab work are within parameters for treatment today.   Message received from DOROTHA Hope NP to proceed with treatment.   Treatment given today per MD orders. Tolerated infusion without adverse affects. Vital signs stable. No complaints at this time. Discharged from clinic ambulatory in stable condition. Alert and oriented x 3. F/U with [facility] as scheduled.

## 2024-05-10 ENCOUNTER — Inpatient Hospital Stay

## 2024-05-13 ENCOUNTER — Encounter: Payer: Self-pay | Admitting: *Deleted

## 2024-05-13 NOTE — Progress Notes (Signed)
 EMLA  cream prior authorization: Approved today by [facility] [id number]. Effective Date: 05/13/2024 Authorization Expiration Date: 08/11/2024  Pt aware.

## 2024-05-20 ENCOUNTER — Ambulatory Visit (HOSPITAL_COMMUNITY)
Admission: RE | Admit: 2024-05-20 | Discharge: 2024-05-20 | Disposition: A | Discharge status: Home / Self Care | Source: Ambulatory Visit | Attending: Oncology | Admitting: Oncology

## 2024-05-20 DIAGNOSIS — C7801 Secondary malignant neoplasm of right lung: Secondary | ICD-10-CM | POA: Diagnosis not present

## 2024-05-20 DIAGNOSIS — C3490 Malignant neoplasm of unspecified part of unspecified bronchus or lung: Secondary | ICD-10-CM | POA: Diagnosis not present

## 2024-05-20 DIAGNOSIS — J432 Centrilobular emphysema: Secondary | ICD-10-CM | POA: Diagnosis not present

## 2024-05-20 MED ORDER — IOHEXOL 300 MG/ML  SOLN
100.0000 mL | Freq: Once | INTRAMUSCULAR | Status: AC | PRN
  Administered 2024-05-20: 100 mL via INTRAVENOUS

## 2024-05-29 ENCOUNTER — Inpatient Hospital Stay: Attending: Hematology

## 2024-05-29 ENCOUNTER — Inpatient Hospital Stay

## 2024-05-29 ENCOUNTER — Inpatient Hospital Stay (HOSPITAL_BASED_OUTPATIENT_CLINIC_OR_DEPARTMENT_OTHER): Admitting: Oncology

## 2024-05-29 VITALS — BP 102/83 | HR 66 | Temp 97.2°F | Resp 18

## 2024-05-29 VITALS — BP 123/76 | HR 84 | Temp 98.1°F | Resp 17 | Wt 142.8 lb

## 2024-05-29 DIAGNOSIS — E032 Hypothyroidism due to medicaments and other exogenous substances: Secondary | ICD-10-CM | POA: Insufficient documentation

## 2024-05-29 DIAGNOSIS — F17210 Nicotine dependence, cigarettes, uncomplicated: Secondary | ICD-10-CM | POA: Diagnosis not present

## 2024-05-29 DIAGNOSIS — R21 Rash and other nonspecific skin eruption: Secondary | ICD-10-CM

## 2024-05-29 DIAGNOSIS — C7801 Secondary malignant neoplasm of right lung: Secondary | ICD-10-CM

## 2024-05-29 DIAGNOSIS — Z5112 Encounter for antineoplastic immunotherapy: Secondary | ICD-10-CM | POA: Insufficient documentation

## 2024-05-29 DIAGNOSIS — Z79620 Long term (current) use of immunosuppressive biologic: Secondary | ICD-10-CM | POA: Insufficient documentation

## 2024-05-29 DIAGNOSIS — C787 Secondary malignant neoplasm of liver and intrahepatic bile duct: Secondary | ICD-10-CM | POA: Diagnosis not present

## 2024-05-29 DIAGNOSIS — C7931 Secondary malignant neoplasm of brain: Secondary | ICD-10-CM | POA: Diagnosis not present

## 2024-05-29 DIAGNOSIS — C3411 Malignant neoplasm of upper lobe, right bronchus or lung: Secondary | ICD-10-CM | POA: Insufficient documentation

## 2024-05-29 DIAGNOSIS — D72819 Decreased white blood cell count, unspecified: Secondary | ICD-10-CM

## 2024-05-29 LAB — CBC WITH DIFFERENTIAL/PLATELET
Abs Immature Granulocytes: 0 K/uL (ref 0.00–0.07)
Basophils Absolute: 0 K/uL (ref 0.0–0.1)
Basophils Relative: 2 %
Eosinophils Absolute: 0.1 K/uL (ref 0.0–0.5)
Eosinophils Relative: 4 %
HCT: 33.9 % — ABNORMAL LOW (ref 36.0–46.0)
Hemoglobin: 11.9 g/dL — ABNORMAL LOW (ref 12.0–15.0)
Immature Granulocytes: 0 %
Lymphocytes Relative: 32 %
Lymphs Abs: 0.4 K/uL — ABNORMAL LOW (ref 0.7–4.0)
MCH: 34 pg (ref 26.0–34.0)
MCHC: 35.1 g/dL (ref 30.0–36.0)
MCV: 96.9 fL (ref 80.0–100.0)
Monocytes Absolute: 0.2 K/uL (ref 0.1–1.0)
Monocytes Relative: 19 %
Neutro Abs: 0.6 K/uL — ABNORMAL LOW (ref 1.7–7.7)
Neutrophils Relative %: 43 %
Platelets: 115 K/uL — ABNORMAL LOW (ref 150–400)
RBC: 3.5 MIL/uL — ABNORMAL LOW (ref 3.87–5.11)
RDW: 14.2 % (ref 11.5–15.5)
Smear Review: NORMAL
WBC: 1.3 K/uL — CL (ref 4.0–10.5)
nRBC: 0 % (ref 0.0–0.2)

## 2024-05-29 LAB — COMPREHENSIVE METABOLIC PANEL WITH GFR
ALT: 22 U/L (ref 0–44)
AST: 19 U/L (ref 15–41)
Albumin: 4.5 g/dL (ref 3.5–5.0)
Alkaline Phosphatase: 68 U/L (ref 38–126)
Anion gap: 10 (ref 5–15)
BUN: 8 mg/dL (ref 6–20)
CO2: 26 mmol/L (ref 22–32)
Calcium: 9.4 mg/dL (ref 8.9–10.3)
Chloride: 106 mmol/L (ref 98–111)
Creatinine, Ser: 0.61 mg/dL (ref 0.44–1.00)
GFR, Estimated: >60 mL/min (ref >60)
Glucose, Bld: 120 mg/dL — ABNORMAL HIGH (ref 70–99)
Potassium: 4 mmol/L (ref 3.5–5.1)
Sodium: 142 mmol/L (ref 135–145)
Total Bilirubin: 0.2 mg/dL (ref 0.0–1.2)
Total Protein: 6.8 g/dL (ref 6.5–8.1)

## 2024-05-29 LAB — TSH: TSH: 34.1 u[IU]/mL — ABNORMAL HIGH (ref 0.350–4.500)

## 2024-05-29 LAB — MAGNESIUM: Magnesium: 2.5 mg/dL — ABNORMAL HIGH (ref 1.7–2.4)

## 2024-05-29 LAB — T4, FREE: Free T4: 0.53 ng/dL — ABNORMAL LOW (ref 0.61–1.12)

## 2024-05-29 MED ORDER — SODIUM CHLORIDE 0.9 % IV SOLN: INTRAVENOUS | Status: DC

## 2024-05-29 MED ORDER — SODIUM CHLORIDE 0.9 % IV SOLN
200.0000 mg | Freq: Once | INTRAVENOUS | Status: AC
  Administered 2024-05-29: 200 mg via INTRAVENOUS
  Filled 2024-05-29: qty 8

## 2024-05-29 MED ORDER — LEVOTHYROXINE SODIUM 75 MCG PO TABS: 75.0000 ug | ORAL_TABLET | Freq: Every day | ORAL | 2 refills | Status: DC

## 2024-05-29 MED ORDER — TRIAMCINOLONE ACETONIDE 0.1 % EX CREA: 1.0000 | TOPICAL_CREAM | Freq: Two times a day (BID) | CUTANEOUS | 2 refills | Status: AC

## 2024-05-29 NOTE — Progress Notes (Signed)
 Orders received to hold Alimta  today.  Removed from today's treatment plan.  V.O. Dr Ivery Molt, PharmD

## 2024-05-29 NOTE — Patient Instructions (Signed)
 [facility] [place] - A DEPT OF [facility]  Discharge Instructions: Thank you for choosing [facility] to provide your oncology and hematology care.  If you have a lab appointment with the Cancer Center - please note that after April 8th, 2024, all labs will be drawn in the cancer center.  You do not have to check in or register with the main entrance as you have in the past but will complete your check-in in the cancer center.  Wear comfortable clothing and clothing appropriate for easy access to any Portacath or PICC line.   We strive to give you quality time with your provider. You may need to reschedule your appointment if you arrive late (15 or more minutes).  Arriving late affects you and other patients whose appointments are after yours.  Also, if you miss three or more appointments without notifying the office, you may be dismissed from the clinic at the provider's discretion.      For prescription refill requests, have your pharmacy contact our office and allow 72 hours for refills to be completed.    Today you received the following chemotherapy and/or immunotherapy agents Keytruda      To help prevent nausea and vomiting after your treatment, we encourage you to take your nausea medication as directed.  BELOW ARE SYMPTOMS THAT SHOULD BE REPORTED IMMEDIATELY: *FEVER GREATER THAN 100.4 F (38 C) OR HIGHER *CHILLS OR SWEATING *NAUSEA AND VOMITING THAT IS NOT CONTROLLED WITH YOUR NAUSEA MEDICATION *UNUSUAL SHORTNESS OF BREATH *UNUSUAL BRUISING OR BLEEDING *URINARY PROBLEMS (pain or burning when urinating, or frequent urination) *BOWEL PROBLEMS (unusual diarrhea, constipation, pain near the anus) TENDERNESS IN MOUTH AND THROAT WITH OR WITHOUT PRESENCE OF ULCERS (sore throat, sores in mouth, or a toothache) UNUSUAL RASH, SWELLING OR PAIN  UNUSUAL VAGINAL DISCHARGE OR ITCHING   Items with * indicate a potential emergency and should be followed up  as soon as possible or go to the Emergency Department if any problems should occur.  Please show the CHEMOTHERAPY ALERT CARD or IMMUNOTHERAPY ALERT CARD at check-in to the Emergency Department and triage nurse.  Should you have questions after your visit or need to cancel or reschedule your appointment, please contact [facility]  - A DEPT OF [facility] [phone]  and follow the prompts.  Office hours are 8:00 a.m. to 4:30 p.m. Monday - Friday. Please note that voicemails left after 4:00 p.m. may not be returned until the following business day.  We are closed weekends and major holidays. You have access to a nurse at all times for urgent questions. Please call the main number to the clinic [phone] and follow the prompts.  For any non-urgent questions, you may also contact your provider using MyChart. We now offer e-Visits for anyone 30 and older to request care online for non-urgent symptoms. For details visit [email or web address].   Also download the MyChart app! Go to the app store, search "MyChart", open the app, select [place], and log in with your MyChart username and password.

## 2024-05-29 NOTE — Patient Instructions (Addendum)
 [facility] at [facility] Discharge Instructions   You were seen and examined today by Dr. Davonna.  She reviewed the results of your lab work which are mostly normal/stable. Your white blood cell count is low with your absolute neutrophil count being 0.6. We will hold your the pemetrexed  (Alimta ) today. We will give the Keytruda  only today.   She reviewed the results of your CT scan which was stable.   We will proceed with your treatment today.   Return as scheduled.    Thank you for choosing [facility] at [facility] to provide your oncology and hematology care.  To afford each patient quality time with our provider, please arrive at least 15 minutes before your scheduled appointment time.   If you have a lab appointment with the Cancer Center please come in thru the Main Entrance and check in at the main information desk.  You need to re-schedule your appointment should you arrive 10 or more minutes late.  We strive to give you quality time with our providers, and arriving late affects you and other patients whose appointments are after yours.  Also, if you no show three or more times for appointments you may be dismissed from the clinic at the providers discretion.     Again, thank you for choosing [facility].  Our hope is that these requests will decrease the amount of time that you wait before being seen by our physicians.       _____________________________________________________________  Should you have questions after your visit to [facility], please contact our office at [phone] and follow the prompts.  Our office hours are 8:00 a.m. and 4:30 p.m. Monday - Friday.  Please note that voicemails left after 4:00 p.m. may not be returned until the following business day.  We are closed weekends and major holidays.  You do have access to a nurse 24-7, just call the main number to the clinic [phone] and  do not press any options, hold on the line and a nurse will answer the phone.    For prescription refill requests, have your pharmacy contact our office and allow 72 hours.    Due to Covid, you will need to wear a mask upon entering the hospital. If you do not have a mask, a mask will be given to you at the Main Entrance upon arrival. For doctor visits, patients may have 1 support person age 62 or older with them. For treatment visits, patients can not have anyone with them due to social distancing guidelines and our immunocompromised population.

## 2024-05-29 NOTE — Progress Notes (Signed)
 CRITICAL VALUE ALERT Critical value received:  WBC 1.3.  Date of notification:  05-29-2024 Time of notification: 09:30 am.  Critical value read back:  Yes.   Nurse who received alert:  B.Nikyla Navedo RN.  MD notified time and response:  Dr. Davonna @ 09:34 am.

## 2024-05-29 NOTE — Progress Notes (Signed)
 Patient presents today for Keytruda /Alimta  infusion per providers order.  Vital signs and labs reviewed by MD.  ANC noted to be 0.6.  Message received from Isaiah Piety RN/Dr. Davonna patient holding Alimta  today proceed with Keytruda .  Treatment given today per MD orders.  Stable during infusion without adverse affects.  Vital signs stable.  No complaints at this time.  Discharge from clinic ambulatory in stable condition.  Alert and oriented X 3.  Follow up with [facility] as scheduled.

## 2024-05-30 NOTE — Progress Notes (Signed)
 Patient Care Team: Zollie Lowers, MD as PCP - General (Family Medicine) Dolphus Reiter, MD as Consulting Physician (Rheumatology) Davonna Siad, MD as Medical Oncologist (Medical Oncology) Celestia Joesph SQUIBB, RN as Oncology Nurse Navigator (Medical Oncology) Tamea Dedra CROME, MD as Consulting Physician (Pulmonary Disease) Darlean Ozell NOVAK, MD as Consulting Physician (Pulmonary Disease)  Clinic Day:  05/30/2024  Referring physician: Zollie Lowers, MD   CHIEF COMPLAINT:  CC: Metastatic lung adenocarcinoma    ASSESSMENT & PLAN:   Assessment & Plan: Julie Horn  is a 48 y.o. 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   Assessment & Plan     The patient understands the plans discussed today and is in agreement with them.  She knows to contact our office if she develops concerns prior to her next appointment.  *** minutes of total time was spent for this patient encounter, including preparation,face-to-face counseling with the patient and coordination of care, physical exam, and documentation of the encounter.    Julie Horn,acting as a neurosurgeon for Siad Davonna, MD.,have documented all relevant documentation on the behalf of Siad Davonna, MD,as directed by  Siad Davonna, MD while in the presence of Siad Davonna, MD.  ***  Julie SAUNDERS Ege  [facility] [place] - A DEPT OF [facility] [address] [place] [address] Dept: [phone] Dept Fax: [phone]   No orders of the defined types were placed in this encounter.    ONCOLOGY HISTORY:   Diagnosis: Right Lung metastatic adenocarcinoma  -09/25/2023: CT CAP: Spiculated mass in the posterior right pulmonary apex measuring 2.6 x 1.4 cm, consistent with primary bronchogenic malignancy. Enlarged right hilar lymph node measuring 1.5 x 1.2 cm, consistent with nodal metastatic disease. Tiny hypodensities in the central  liver, incompletely characterized, although statistically likely benign cysts or hemangiomas. Small metastases not strictly excluded. Soft tissue nodule within the right gluteal subcutaneous fat; given location most likely an injection granuloma, however subcutaneous soft tissue metastases are occasionally seen in metastatic lung malignancy. -09/25/2023: MRI Brain: Heterogeneously contrast-enhancing mass of the right cerebellar hemisphere with a large amount of surrounding vasogenic edema. Metastasis and hemangioblastoma are the most common infratentorial tumors in adults. Astrocytoma is another possibility. Mass effect on the fourth ventricle without hydrocephalus. -10/11/2023: Right cerebellar resection.  Pathology: Metastatic poorly differentiated adenocarcinoma consistent with lung primary. -10/05/2023: Initial PET:  Hypermetabolic RIGHT upper lobe lung nodule consistent primary bronchogenic carcinoma. Mildly hypermetabolic RIGHT lower paratracheal and RIGHT hilar lymph nodes are concerning for nodal metastasis. No contralateral hypermetabolic lymph nodes. No evidence of distant metastatic disease. -11/13/2023-11/17/2023: Radiation to right cerebellar area completed -11/27/2023-01/08/2024: chemoRT with carboplatin  and paclitaxel  completed -02/07/2024: CT CAP:  New 10 mm subcapsular low-density lesion in the anterior left liver. Spiculated pulmonary nodule posterior right upper lobe is slightly decreased in size in the interval. Right hilar lymph node measured previously at 12 mm short axis is 6 mm short axis today. No mediastinal lymphadenopathy by CT size criteria. -02/16/2024: MRI Liver: 1.4 cm rim-enhancing mass in segment 4B of the left hepatic lobe, highly suspicious for liver metastasis. No other sites of metastatic disease identified within the abdomen. -02/27/2024-current: Maintenance carboplatin , pemetrexed  and pembrolizumab   -05/20/2024: CT CAP: Stable to improved disease  Current  Treatment:  ***  INTERVAL HISTORY:   Julie Horn is here today for follow up for ***. Patient is accompanied by *** .     I have reviewed the past  medical history, past surgical history, social history and family history with the patient and they are unchanged from previous note.  ALLERGIES:  is allergic to levofloxacin, sulfa antibiotics, azithromycin, doxycycline , latex, and wound dressing adhesive.  MEDICATIONS:  Current Outpatient Medications  Medication Sig Dispense Refill   acetaminophen  (TYLENOL ) 500 MG tablet Take 1,000 mg by mouth every 6 (six) hours as needed for mild pain (pain score 1-3). Take 2 tablets (1,000 mg total) by mouth every six (6) hours.     Albuterol -Budesonide  (AIRSUPRA ) 90-80 MCG/ACT AERO Inhale 2 puffs into the lungs every 4 (four) hours as needed. 10.7 g 11   ascorbic acid (VITAMIN C) 500 MG tablet Take 500 mg by mouth daily. Take 1 tablet (500 mg total) by mouth daily     butalbital -acetaminophen -caffeine  (FIORICET) 50-325-40 MG tablet Take 1 tablet by mouth every 4 (four) hours as needed for headache. 14 tablet 1   cetirizine  (ZYRTEC ) 10 MG chewable tablet Chew 10 mg by mouth.     co-enzyme Q-10 30 MG capsule Take 30 mg by mouth 3 (three) times daily. Take 1 capsule (30 mg total) by mouth Three (3) times a day.     Cyanocobalamin  (VITAMIN B 12) 500 MCG TABS Take 500 mg by mouth daily.     dexamethasone  (DECADRON ) 4 MG tablet Take twice daily for 3 days following each treatment 30 tablet 0   famotidine  (PEPCID ) 20 MG tablet Take 20 mg by mouth.     folic acid  (FOLVITE ) 1 MG tablet Take 1 tablet (1 mg total) by mouth daily. 30 tablet 6   guaiFENesin  (MUCINEX ) 600 MG 12 hr tablet Take 1,200 mg by mouth 2 (two) times daily. Take 1 tablet (1200 mg total) by mouth every twelve (12) hours.     levothyroxine  (SYNTHROID ) 75 MCG tablet Take 1 tablet (75 mcg total) by mouth daily before breakfast. 60 tablet 2   LORazepam (ATIVAN) 0.5 MG tablet Take by mouth. One  for MRI in december     magnesium oxide (MAG-OX) 400 (240 Mg) MG tablet Take 400 mg by mouth daily. Take 1 tablet (400 mg total) by mouth daily.     melatonin 1 MG TABS tablet Take 1 mg by mouth at bedtime. Take 5 tablets (5 mg total) by mouth nightly.     ondansetron  (ZOFRAN -ODT) 4 MG disintegrating tablet Take 4 mg by mouth every 8 (eight) hours as needed.     PACLitaxel  (TAXOL  IV) Inject 1 Units into the vein every 21 ( twenty-one) days.     pegfilgrastim -jmdb (FULPHILA ) 6 MG/0.6ML injection Inject 6 mg into the skin every 21 ( twenty-one) days.     Pembrolizumab  (KEYTRUDA  IV) Inject 1 Units into the vein every 21 ( twenty-one) days.     PEMEtrexed  Disodium (ALIMTA  IV) Inject 1 Units into the vein every 21 ( twenty-one) days.     prochlorperazine  (COMPAZINE ) 10 MG tablet Take 10 mg by mouth every 6 (six) hours as needed for nausea or vomiting.     rizatriptan  (MAXALT -MLT) 10 MG disintegrating tablet Take 1 tablet (10 mg total) by mouth as needed for migraine. May repeat in 2 hours if needed 9 tablet 11   sennosides-docusate sodium (SENOKOT-S) 8.6-50 MG tablet Take 2 tablets by mouth daily.     traMADol  (ULTRAM ) 50 MG tablet Take 1 tablet (50 mg total) by mouth every 12 (twelve) hours as needed for severe pain (pain score 7-10) (headaches). 30 tablet 0   traZODone  (DESYREL ) 150 MG tablet USE  FROM 1/3 TO 1 TABLET NIGHTLY AS NEEDED FOR SLEEP. 90 tablet 1   triamcinolone cream (KENALOG) 0.1 % Apply 1 Application topically 2 (two) times daily. 60 g 2   No current facility-administered medications for this visit.    REVIEW OF SYSTEMS:   Constitutional: Denies fevers, chills or abnormal weight loss Eyes: Denies blurriness of vision Ears, nose, mouth, throat, and face: Denies mucositis or sore throat Respiratory: Denies cough, dyspnea or wheezes Cardiovascular: Denies palpitation, chest discomfort or lower extremity swelling Gastrointestinal:  Denies nausea, heartburn or change in bowel  habits Skin: Denies abnormal skin rashes Lymphatics: Denies new lymphadenopathy or easy bruising Neurological:Denies numbness, tingling or new weaknesses Behavioral/Psych: Mood is stable, no new changes  All other systems were reviewed with the patient and are negative.   VITALS:  Last menstrual period 07/26/2019.  Wt Readings from Last 3 Encounters:  05/29/24 142 lb 12.8 oz (64.8 kg)  05/08/24 141 lb (64 kg)  04/17/24 140 lb 10.5 oz (63.8 kg)    There is no height or weight on file to calculate BMI.  Performance status (ECOG): {CHL ONC [id number]  PHYSICAL EXAM:   GENERAL:alert, no distress and comfortable SKIN: skin color, texture, turgor are normal, no rashes or significant lesions EYES: normal, Conjunctiva are pink and non-injected, sclera clear OROPHARYNX:no exudate, no erythema and lips, buccal mucosa, and tongue normal  NECK: supple, thyroid  normal size, non-tender, without nodularity LYMPH:  no palpable lymphadenopathy in the cervical, axillary or inguinal LUNGS: clear to auscultation and percussion with normal breathing effort HEART: regular rate & rhythm and no murmurs and no lower extremity edema ABDOMEN:abdomen soft, non-tender and normal bowel sounds Musculoskeletal:no cyanosis of digits and no clubbing  NEURO: alert & oriented x 3 with fluent speech, no focal motor/sensory deficits  LABORATORY DATA:  I have reviewed the data as listed     Component Value Date/Time   NA 142 05/29/2024 0836   NA 145 (H) 05/22/2023 1054   K 4.0 05/29/2024 0836   CL 106 05/29/2024 0836   CO2 26 05/29/2024 0836   GLUCOSE 120 (H) 05/29/2024 0836   BUN 8 05/29/2024 0836   BUN 7 05/22/2023 1054   CREATININE 0.61 05/29/2024 0836   CALCIUM 9.4 05/29/2024 0836   PROT 6.8 05/29/2024 0836   PROT 7.4 05/22/2023 1054   ALBUMIN 4.5 05/29/2024 0836   ALBUMIN 4.8 05/22/2023 1054   AST 19 05/29/2024 0836   ALT 22 05/29/2024 0836   ALKPHOS 68 05/29/2024 0836   BILITOT 0.2  05/29/2024 0836   BILITOT 0.5 05/22/2023 1054   GFRNONAA >60 05/29/2024 0836     Lab Results  Component Value Date   WBC 1.3 (LL) 05/29/2024   NEUTROABS 0.6 (L) 05/29/2024   HGB 11.9 (L) 05/29/2024   HCT 33.9 (L) 05/29/2024   MCV 96.9 05/29/2024   PLT 115 (L) 05/29/2024    @LASTCHEMISTRY @   RADIOGRAPHIC STUDIES: I have personally reviewed the radiological images as listed and agreed with the findings in the report. CT CHEST ABDOMEN PELVIS W CONTRAST CLINICAL DATA:  Metastatic disease evaluation, lung cancer * Tracking Code: BO *  EXAM: CT CHEST, ABDOMEN, AND PELVIS WITH CONTRAST  TECHNIQUE: Multidetector CT imaging of the chest, abdomen and pelvis was performed following the standard protocol during bolus administration of intravenous contrast.  RADIATION DOSE REDUCTION: This exam was performed according to the departmental dose-optimization program which includes automated exposure control, adjustment of the mA and/or kV according to patient size and/or use of  iterative reconstruction technique.  CONTRAST:  OMNIPAQUE  IOHEXOL  300 MG/ML  SOLN  COMPARISON:  CT chest abdomen pelvis, 02/07/2024, MR abdomen, 02/16/2024  FINDINGS: CT CHEST FINDINGS  Cardiovascular: Right chest port catheter. Normal heart size. No pericardial effusion.  Mediastinum/Nodes: No persistently enlarged mediastinal, hilar, or axillary lymph nodes. Thyroid  gland, trachea, and esophagus demonstrate no significant findings.  Lungs/Pleura: Mild centrilobular emphysema. Diffuse bilateral bronchial wall thickening and a background of fine nodularity and ground-glass throughout the lungs. Unchanged spiculated nodule in the posterior right upper lobe measuring 1.6 x 1.2 cm (series 4, image 25). No pleural effusion or pneumothorax.  Musculoskeletal: No chest wall abnormality. No acute osseous findings.  CT ABDOMEN PELVIS FINDINGS  Hepatobiliary: Diminished size of a subcapsular lesion in  hepatic segment IVB, no greater than 0.4 cm on today's examination with overlying subcapsular retraction, previously 1.0 cm (series 2, image 65). No new liver lesions. No gallstones, gallbladder wall thickening, or biliary dilatation.  Pancreas: Unremarkable. No pancreatic ductal dilatation or surrounding inflammatory changes.  Spleen: Normal in size without significant abnormality.  Adrenals/Urinary Tract: Adrenal glands are unremarkable. Kidneys are normal, without renal calculi, solid lesion, or hydronephrosis. Bladder is unremarkable.  Stomach/Bowel: Stomach is within normal limits. Appendix appears normal. No evidence of bowel wall thickening, distention, or inflammatory changes.  Vascular/Lymphatic: No significant vascular findings are present. No enlarged abdominal or pelvic lymph nodes.  Reproductive: No mass or other abnormality.  Other: No abdominal wall hernia or abnormality. No ascites.  Musculoskeletal: No acute osseous findings.  IMPRESSION: 1. Unchanged spiculated nodule in the posterior right upper lobe measuring 1.6 x 1.2 cm. 2. Diminished size of a subcapsular lesion in hepatic segment IVB, no greater than 0.4 cm on today's examination with overlying subcapsular retraction, previously 1.0 cm. No new liver lesions. 3. No persistently enlarged mediastinal, hilar, or axillary lymph nodes. 4. Emphysema and smoking-related respiratory bronchiolitis.  Emphysema (ICD10-J43.9).  Electronically Signed   By: Marolyn JONETTA Jaksch M.D.   On: 05/22/2024 06:47

## 2024-05-31 ENCOUNTER — Inpatient Hospital Stay

## 2024-05-31 ENCOUNTER — Encounter: Payer: Self-pay | Admitting: Oncology

## 2024-06-12 ENCOUNTER — Other Ambulatory Visit: Payer: Self-pay | Admitting: Oncology

## 2024-06-12 DIAGNOSIS — C7801 Secondary malignant neoplasm of right lung: Secondary | ICD-10-CM

## 2024-06-19 ENCOUNTER — Inpatient Hospital Stay

## 2024-06-19 ENCOUNTER — Inpatient Hospital Stay: Admitting: Oncology

## 2024-06-19 ENCOUNTER — Inpatient Hospital Stay: Admitting: Licensed Clinical Social Worker

## 2024-06-19 VITALS — BP 124/80 | HR 67 | Temp 98.2°F | Resp 16

## 2024-06-19 DIAGNOSIS — C7931 Secondary malignant neoplasm of brain: Secondary | ICD-10-CM | POA: Diagnosis not present

## 2024-06-19 DIAGNOSIS — F17210 Nicotine dependence, cigarettes, uncomplicated: Secondary | ICD-10-CM | POA: Diagnosis not present

## 2024-06-19 DIAGNOSIS — Z79620 Long term (current) use of immunosuppressive biologic: Secondary | ICD-10-CM | POA: Diagnosis not present

## 2024-06-19 DIAGNOSIS — E032 Hypothyroidism due to medicaments and other exogenous substances: Secondary | ICD-10-CM

## 2024-06-19 DIAGNOSIS — C7801 Secondary malignant neoplasm of right lung: Secondary | ICD-10-CM | POA: Diagnosis not present

## 2024-06-19 DIAGNOSIS — Z5112 Encounter for antineoplastic immunotherapy: Secondary | ICD-10-CM | POA: Diagnosis not present

## 2024-06-19 DIAGNOSIS — C787 Secondary malignant neoplasm of liver and intrahepatic bile duct: Secondary | ICD-10-CM | POA: Diagnosis not present

## 2024-06-19 DIAGNOSIS — C3411 Malignant neoplasm of upper lobe, right bronchus or lung: Secondary | ICD-10-CM | POA: Diagnosis not present

## 2024-06-19 LAB — CBC WITH DIFFERENTIAL/PLATELET
Abs Immature Granulocytes: 0 K/uL (ref 0.00–0.07)
Basophils Absolute: 0 K/uL (ref 0.0–0.1)
Basophils Relative: 1 %
Eosinophils Absolute: 0.1 K/uL (ref 0.0–0.5)
Eosinophils Relative: 5 %
HCT: 37 % (ref 36.0–46.0)
Hemoglobin: 12.9 g/dL (ref 12.0–15.0)
Immature Granulocytes: 0 %
Lymphocytes Relative: 23 %
Lymphs Abs: 0.5 K/uL — ABNORMAL LOW (ref 0.7–4.0)
MCH: 34.8 pg — ABNORMAL HIGH (ref 26.0–34.0)
MCHC: 34.9 g/dL (ref 30.0–36.0)
MCV: 99.7 fL (ref 80.0–100.0)
Monocytes Absolute: 0.3 K/uL (ref 0.1–1.0)
Monocytes Relative: 14 %
Neutro Abs: 1.3 K/uL — ABNORMAL LOW (ref 1.7–7.7)
Neutrophils Relative %: 57 %
Platelets: 175 K/uL (ref 150–400)
RBC: 3.71 MIL/uL — ABNORMAL LOW (ref 3.87–5.11)
RDW: 14.3 % (ref 11.5–15.5)
WBC: 2.2 K/uL — ABNORMAL LOW (ref 4.0–10.5)
nRBC: 0 % (ref 0.0–0.2)

## 2024-06-19 LAB — COMPREHENSIVE METABOLIC PANEL WITH GFR
ALT: 13 U/L (ref 0–44)
AST: 15 U/L (ref 15–41)
Albumin: 4.6 g/dL (ref 3.5–5.0)
Alkaline Phosphatase: 64 U/L (ref 38–126)
Anion gap: 10 (ref 5–15)
BUN: 6 mg/dL (ref 6–20)
CO2: 26 mmol/L (ref 22–32)
Calcium: 9.7 mg/dL (ref 8.9–10.3)
Chloride: 105 mmol/L (ref 98–111)
Creatinine, Ser: 0.58 mg/dL (ref 0.44–1.00)
GFR, Estimated: >60 mL/min (ref >60)
Glucose, Bld: 132 mg/dL — ABNORMAL HIGH (ref 70–99)
Potassium: 4.1 mmol/L (ref 3.5–5.1)
Sodium: 141 mmol/L (ref 135–145)
Total Bilirubin: 0.4 mg/dL (ref 0.0–1.2)
Total Protein: 6.9 g/dL (ref 6.5–8.1)

## 2024-06-19 LAB — MAGNESIUM: Magnesium: 2.3 mg/dL (ref 1.7–2.4)

## 2024-06-19 LAB — TSH: TSH: 10.9 u[IU]/mL — ABNORMAL HIGH (ref 0.350–4.500)

## 2024-06-19 MED ORDER — CYANOCOBALAMIN 1000 MCG/ML IJ SOLN
1000.0000 ug | Freq: Once | INTRAMUSCULAR | Status: AC
  Administered 2024-06-19: 1000 ug via INTRAMUSCULAR
  Filled 2024-06-19: qty 1

## 2024-06-19 MED ORDER — ONDANSETRON HCL 4 MG/2ML IJ SOLN
8.0000 mg | Freq: Once | INTRAMUSCULAR | Status: AC
  Administered 2024-06-19: 8 mg via INTRAVENOUS
  Filled 2024-06-19: qty 4

## 2024-06-19 MED ORDER — SODIUM CHLORIDE 0.9 % IV SOLN
200.0000 mg | Freq: Once | INTRAVENOUS | Status: AC
  Administered 2024-06-19: 200 mg via INTRAVENOUS
  Filled 2024-06-19: qty 8

## 2024-06-19 MED ORDER — PROCHLORPERAZINE MALEATE 10 MG PO TABS
10.0000 mg | ORAL_TABLET | Freq: Once | ORAL | Status: DC
  Filled 2024-06-19: qty 1

## 2024-06-19 MED ORDER — SODIUM CHLORIDE 0.9 % IV SOLN
500.0000 mg/m2 | Freq: Once | INTRAVENOUS | Status: AC
  Administered 2024-06-19: 900 mg via INTRAVENOUS
  Filled 2024-06-19: qty 20

## 2024-06-19 MED ORDER — SODIUM CHLORIDE 0.9 % IV SOLN: INTRAVENOUS | Status: DC

## 2024-06-19 NOTE — Patient Instructions (Signed)
 [facility] [place] - A DEPT OF [facility]  Discharge Instructions: Thank you for choosing [facility] to provide your oncology and hematology care.  If you have a lab appointment with the Cancer Center - please note that after April 8th, 2024, all labs will be drawn in the cancer center.  You do not have to check in or register with the main entrance as you have in the past but will complete your check-in in the cancer center.  Wear comfortable clothing and clothing appropriate for easy access to any Portacath or PICC line.   We strive to give you quality time with your provider. You may need to reschedule your appointment if you arrive late (15 or more minutes).  Arriving late affects you and other patients whose appointments are after yours.  Also, if you miss three or more appointments without notifying the office, you may be dismissed from the clinic at the provider's discretion.      For prescription refill requests, have your pharmacy contact our office and allow 72 hours for refills to be completed.    Today you received the following chemotherapy and/or immunotherapy agents opdivo and yervoy       To help prevent nausea and vomiting after your treatment, we encourage you to take your nausea medication as directed.  BELOW ARE SYMPTOMS THAT SHOULD BE REPORTED IMMEDIATELY: *FEVER GREATER THAN 100.4 F (38 C) OR HIGHER *CHILLS OR SWEATING *NAUSEA AND VOMITING THAT IS NOT CONTROLLED WITH YOUR NAUSEA MEDICATION *UNUSUAL SHORTNESS OF BREATH *UNUSUAL BRUISING OR BLEEDING *URINARY PROBLEMS (pain or burning when urinating, or frequent urination) *BOWEL PROBLEMS (unusual diarrhea, constipation, pain near the anus) TENDERNESS IN MOUTH AND THROAT WITH OR WITHOUT PRESENCE OF ULCERS (sore throat, sores in mouth, or a toothache) UNUSUAL RASH, SWELLING OR PAIN  UNUSUAL VAGINAL DISCHARGE OR ITCHING   Items with * indicate a potential emergency and should be  followed up as soon as possible or go to the Emergency Department if any problems should occur.  Please show the CHEMOTHERAPY ALERT CARD or IMMUNOTHERAPY ALERT CARD at check-in to the Emergency Department and triage nurse.  Should you have questions after your visit or need to cancel or reschedule your appointment, please contact [facility] [place] - A DEPT OF [facility] [phone]  and follow the prompts.  Office hours are 8:00 a.m. to 4:30 p.m. Monday - Friday. Please note that voicemails left after 4:00 p.m. may not be returned until the following business day.  We are closed weekends and major holidays. You have access to a nurse at all times for urgent questions. Please call the main number to the clinic [phone] and follow the prompts.  For any non-urgent questions, you may also contact your provider using MyChart. We now offer e-Visits for anyone 76 and older to request care online for non-urgent symptoms. For details visit [email or web address].   Also download the MyChart app! Go to the app store, search "MyChart", open the app, select [place], and log in with your MyChart username and password.

## 2024-06-19 NOTE — Progress Notes (Signed)
 Confirmed with Delon Hope, NP to give full dose of Alimta  at 500 mg/m2.  Niels Molt, PharmD Clinical Pharmacist

## 2024-06-19 NOTE — Progress Notes (Signed)
 Patient Care Team: Zollie Lowers, MD as PCP - General (Family Medicine) Dolphus Reiter, MD as Consulting Physician (Rheumatology) Davonna Siad, MD as Medical Oncologist (Medical Oncology) Celestia Joesph SQUIBB, RN as Oncology Nurse Navigator (Medical Oncology) Tamea Dedra CROME, MD as Consulting Physician (Pulmonary Disease) Darlean Ozell NOVAK, MD as Consulting Physician (Pulmonary Disease)  Clinic Day:  05/29/2024  Referring physician: Zollie Lowers, MD   CHIEF COMPLAINT:  CC: Metastatic lung adenocarcinoma    ASSESSMENT & PLAN:   Assessment & Plan: Julie Horn  is a 48 y.o. 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   S/p cerebellar lesion resection consistent with lung primary.   Oncology history as below. PET scan showed limited disease to lung and lymph nodes.  Discussed at tumor board, surgery is not a feasible option secondary to lymph node involvement.  Recommended chemoradiation. Caris: PD-L1: 100% Completed chemoRT on 01/08/2024 with carboplatin  and paclitaxel  Recent MRI brain with new sites of metastasis and CT scan within evidence of new liver lesion with response locally in the lung. MRI liver with confirmation of liver metastasis. Discussion at the thoracic tumor board, and started on chemoimmunotherapy on 02/28/2024: Started on carboplatin , pemetrexed  and pembrolizumab .  Currently on maintenance pemetrexed  and pembrolizumab    -Cycle 6-day 1 today.  Patient tolerated previous cycle well with no significant side effects. - Labs reviewed today:CMP: WNL, magnesium: 2.3, CBC: WBC: 2.2, ANC: 1300, hemoglobin: 12.9, platelets: 175 - Recent CT scan shows good response in the liver lesion and stable right upper lobe lesion.  No lymphadenopathy seen at this  time. -Treatment was held last week due to low white counts.  - As counts have recovered, continue current treatment plan. - Educated patient on reaching out if she has rash, shortness of breath, abdominal pain, diarrhea that persists - Will repeat CT scan in 3 months (around 08/18/24)   Return to clinic before next cycle of chemoimmunotherapy.  Metastasis to brain Cerebellar lesion s/p resection consistent with the poorly differentiated adenocarcinoma of lung primary. Patient completed gamma knife at [facility].  Currently on a clinical trial assessing gamma knife versus gamma tile Completed steroid taper. Repeat MRI brain with multiple new brain metastasis.  Completed SBRT at [facility] Recent MRI brain showed very small new lesions but these were evaluated by radiation oncology there and will be monitored for now   - Continue to follow with [facility].  Chemotherapy-induced leukopenia and thrombocytopenia Leukopenia and thrombocytopenia likely secondary to Pemetrexed .  Treatment was felt most recently due to these low counts. Most recent labs have improved.  -Proceed with treatment today.  No dose reduction needed but will continue to monitor.  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 Symptoms included irritability and fatigue. -TSH improved to 10.9 after increasing levothyroxine .  Continue at this dose for now.  Will recheck labs at next visit.  - Recently levothyroxine  increased to 75 mcg daily.  - Will monitor TSH levels every 6 weeks and adjust dose as needed.  Pembrolizumab -induced skin rash Skin rash likely induced by pembrolizumab , located over bilateral thighs and stomach. Hydrocortisone had limited effect. Rash was itchy, especially at night.  - Prescribed triamcinolone  0.1% cream, apply twice daily. - Will increase triamcinolone  to 0.5% if rash does not improve. - Consider  Benadryl or cetirizine  for  itch relief.  Headache, stable, managed with tramadol  and Fioricet Occasional severe headaches managed with tramadol . Neurologist consulted, headaches not frequent. - Continue tramadol  and Fioricet for headache management.  Diarrhea, likely secondary to magnesium supplements. Diarrhea likely secondary to magnesium supplements. - Discontinued magnesium supplementation. - Will keep an eye for immunotherapy induced colitis  Fluid retention, likely related to hypothyroidism Intermittent fluid retention likely related to hypothyroidism, with symptoms of tightness and bruising. - Increased levothyroxine  dose to address hypothyroidism and associated fluid retention.    The patient understands the plans discussed today and is in agreement with them.  She knows to contact our office if she develops concerns prior to her next appointment.  20 minutes of total time was spent for this patient encounter, including preparation,face-to-face counseling with the patient and coordination of care, physical exam, and documentation of the encounter.    [name] Hope, NP  [facility] [place] - A DEPT OF [facility] [address] [place] [address] Dept: [phone] Dept Fax: [phone]   No orders of the defined types were placed in this encounter.    ONCOLOGY HISTORY:   Diagnosis: Right Lung metastatic adenocarcinoma  -09/25/2023: CT CAP: Spiculated mass in the posterior right pulmonary apex measuring 2.6 x 1.4 cm, consistent with primary bronchogenic malignancy. Enlarged right hilar lymph node measuring 1.5 x 1.2 cm, consistent with nodal metastatic disease. Tiny hypodensities in the central liver, incompletely characterized, although statistically likely benign cysts or hemangiomas. Small metastases not strictly excluded. Soft tissue nodule within the right gluteal subcutaneous fat; given location most  likely an injection granuloma, however subcutaneous soft tissue metastases are occasionally seen in metastatic lung malignancy. -09/25/2023: MRI Brain: Heterogeneously contrast-enhancing mass of the right cerebellar hemisphere with a large amount of surrounding vasogenic edema. Metastasis and hemangioblastoma are the most common infratentorial tumors in adults. Astrocytoma is another possibility. Mass effect on the fourth ventricle without hydrocephalus. -10/11/2023: Right cerebellar resection.  Pathology: Metastatic poorly differentiated adenocarcinoma consistent with lung primary. -10/05/2023: Initial PET:  Hypermetabolic RIGHT upper lobe lung nodule consistent primary bronchogenic carcinoma. Mildly hypermetabolic RIGHT lower paratracheal and RIGHT hilar lymph nodes are concerning for nodal metastasis. No contralateral hypermetabolic lymph nodes. No evidence of distant metastatic disease. -11/13/2023-11/17/2023: Radiation to right cerebellar area completed -11/27/2023-01/08/2024: ChemoRT with carboplatin  and paclitaxel   -02/07/2024: CT CAP:  New 10 mm subcapsular low-density lesion in the anterior left liver. Spiculated pulmonary nodule posterior right upper lobe is slightly decreased in size in the interval. Right hilar lymph node measured previously at 12 mm short axis is 6 mm short axis today. No mediastinal lymphadenopathy by CT size criteria. -02/16/2024: MRI Liver: 1.4 cm rim-enhancing mass in segment 4B of the left hepatic lobe, highly suspicious for liver metastasis. No other sites of metastatic disease identified within the abdomen. -02/27/2024-current: Carboplatin , pemetrexed  and pembrolizumab  -05/20/2024: CT CAP: Stable to improved disease  Current Treatment: Maintenance pembrolizumab  and pemetrexed   INTERVAL HISTORY:   History of Present Illness Julie Horn is a 48 year old female with lung cancer and hypothyroidism who presents for follow-up.  Treatment was held last week  secondary to neutropenia.  She is here for reevaluation and next cycle of treatment.  Reports pain in her hips and knees that is chronic and she is wondering if she is able to take NSAIDs such as meloxicam  or ibuprofen.  She takes Tylenol  at times.  Reports occasional constipation and nausea.  Has headaches intermittently.  Reports her biggest concern is her fatigue.  Most recently, her Synthroid  was increased to 75 mcg daily and she is hoping that is helping her levels.  Rash on her abdomen has improved.  She continues hydrocortisone cream.  She takes trazodone  and melatonin for sleep but avoids Benadryl due to its hangover effect.   She experiences headaches, for which she takes tramadol  when they become severe. She has seen a neurologist for this issue and reports a decrease in headache frequency, although the intensity remains high when they occur. She is cautious about taking medications like Tylenol  due to concerns about liver health, although her liver function tests are normal.   I have reviewed the past medical history, past surgical history, social history and family history with the patient and they are unchanged from previous note.  ALLERGIES:  is allergic to levofloxacin, sulfa antibiotics, azithromycin, doxycycline , latex, and wound dressing adhesive.  MEDICATIONS:  Current Outpatient Medications  Medication Sig Dispense Refill   acetaminophen  (TYLENOL ) 500 MG tablet Take 1,000 mg by mouth every 6 (six) hours as needed for mild pain (pain score 1-3). Take 2 tablets (1,000 mg total) by mouth every six (6) hours.     Albuterol -Budesonide  (AIRSUPRA ) 90-80 MCG/ACT AERO Inhale 2 puffs into the lungs every 4 (four) hours as needed. 10.7 g 11   ascorbic acid (VITAMIN C) 500 MG tablet Take 500 mg by mouth daily. Take 1 tablet (500 mg total) by mouth daily     butalbital -acetaminophen -caffeine  (FIORICET) 50-325-40 MG tablet Take 1 tablet by mouth every 4 (four) hours as needed for headache.  14 tablet 1   cetirizine  (ZYRTEC ) 10 MG chewable tablet Chew 10 mg by mouth.     co-enzyme Q-10 30 MG capsule Take 30 mg by mouth 3 (three) times daily. Take 1 capsule (30 mg total) by mouth Three (3) times a day.     Cyanocobalamin  (VITAMIN B 12) 500 MCG TABS Take 500 mg by mouth daily.     dexamethasone  (DECADRON ) 4 MG tablet Take twice daily for 3 days following each treatment 30 tablet 0   famotidine  (PEPCID ) 20 MG tablet Take 20 mg by mouth.     folic acid  (FOLVITE ) 1 MG tablet Take 1 tablet (1 mg total) by mouth daily. 30 tablet 6   guaiFENesin  (MUCINEX ) 600 MG 12 hr tablet Take 1,200 mg by mouth 2 (two) times daily. Take 1 tablet (1200 mg total) by mouth every twelve (12) hours.     levothyroxine  (SYNTHROID ) 75 MCG tablet Take 1 tablet (75 mcg total) by mouth daily before breakfast. 60 tablet 2   LORazepam (ATIVAN) 0.5 MG tablet Take by mouth. One for MRI in december     magnesium oxide (MAG-OX) 400 (240 Mg) MG tablet Take 400 mg by mouth daily. Take 1 tablet (400 mg total) by mouth daily. (Patient not taking: Reported on 06/19/2024)     melatonin 1 MG TABS tablet Take 1 mg by mouth at bedtime. Take 5 tablets (5 mg total) by mouth nightly.     ondansetron  (ZOFRAN -ODT) 4 MG disintegrating tablet Take 4 mg by mouth every 8 (eight) hours as needed.     PACLitaxel  (TAXOL  IV) Inject 1 Units into the vein every 21 ( twenty-one) days.     pegfilgrastim -jmdb (FULPHILA ) 6 MG/0.6ML injection Inject 6 mg into the skin every 21 ( twenty-one) days.     Pembrolizumab  (KEYTRUDA  IV) Inject 1 Units into the vein every 21 ( twenty-one) days.  PEMEtrexed  Disodium (ALIMTA  IV) Inject 1 Units into the vein every 21 ( twenty-one) days.     prochlorperazine  (COMPAZINE ) 10 MG tablet Take 10 mg by mouth every 6 (six) hours as needed for nausea or vomiting.     rizatriptan  (MAXALT -MLT) 10 MG disintegrating tablet Take 1 tablet (10 mg total) by mouth as needed for migraine. May repeat in 2 hours if needed 9 tablet  11   sennosides-docusate sodium (SENOKOT-S) 8.6-50 MG tablet Take 2 tablets by mouth daily.     traMADol  (ULTRAM ) 50 MG tablet Take 1 tablet (50 mg total) by mouth every 12 (twelve) hours as needed for severe pain (pain score 7-10) (headaches). 30 tablet 0   traZODone  (DESYREL ) 150 MG tablet USE FROM 1/3 TO 1 TABLET NIGHTLY AS NEEDED FOR SLEEP. 90 tablet 1   triamcinolone  cream (KENALOG ) 0.1 % Apply 1 Application topically 2 (two) times daily. 60 g 2   No current facility-administered medications for this visit.    REVIEW OF SYSTEMS:   Review of Systems  Constitutional:  Positive for malaise/fatigue.  Gastrointestinal:  Positive for constipation and nausea.  Musculoskeletal:  Positive for joint pain.  Neurological:  Positive for headaches.     VITALS:  Last menstrual period 07/26/2019.  Wt Readings from Last 3 Encounters:  06/19/24 141 lb 5 oz (64.1 kg)  05/29/24 142 lb 12.8 oz (64.8 kg)  05/08/24 141 lb (64 kg)    There is no height or weight on file to calculate BMI.  Performance status (ECOG): 0 - Asymptomatic  PHYSICAL EXAM:   Physical Exam Constitutional:      Appearance: Normal appearance.  Cardiovascular:     Rate and Rhythm: Normal rate and regular rhythm.  Pulmonary:     Effort: Pulmonary effort is normal.     Breath sounds: Normal breath sounds.  Abdominal:     General: Bowel sounds are normal.     Palpations: Abdomen is soft.  Musculoskeletal:        General: No swelling. Normal range of motion.  Neurological:     Mental Status: She is alert and oriented to person, place, and time. Mental status is at baseline.     LABORATORY DATA:  I have reviewed the data as listed     Component Value Date/Time   NA 141 06/19/2024 0952   NA 145 (H) 05/22/2023 1054   K 4.1 06/19/2024 0952   CL 105 06/19/2024 0952   CO2 26 06/19/2024 0952   GLUCOSE 132 (H) 06/19/2024 0952   BUN 6 06/19/2024 0952   BUN 7 05/22/2023 1054   CREATININE 0.58 06/19/2024 0952    CALCIUM 9.7 06/19/2024 0952   PROT 6.9 06/19/2024 0952   PROT 7.4 05/22/2023 1054   ALBUMIN 4.6 06/19/2024 0952   ALBUMIN 4.8 05/22/2023 1054   AST 15 06/19/2024 0952   ALT 13 06/19/2024 0952   ALKPHOS 64 06/19/2024 0952   BILITOT 0.4 06/19/2024 0952   BILITOT 0.5 05/22/2023 1054   GFRNONAA >60 06/19/2024 0952     Lab Results  Component Value Date   WBC 2.2 (L) 06/19/2024   NEUTROABS 1.3 (L) 06/19/2024   HGB 12.9 06/19/2024   HCT 37.0 06/19/2024   MCV 99.7 06/19/2024   PLT 175 06/19/2024    Latest Reference Range & Units 05/29/24 08:40  TSH 0.350 - 4.500 uIU/mL 34.100 (H)  T4,Free(Direct) 0.61 - 1.12 ng/dL 9.46 (L)  (H): Data is abnormally high (L): Data is abnormally low   RADIOGRAPHIC STUDIES:  I have personally reviewed the radiological images as listed and agreed with the findings in the report.  CT CHEST ABDOMEN PELVIS W CONTRAST CLINICAL DATA:  Metastatic disease evaluation, lung cancer * Tracking Code: BO *  EXAM: CT CHEST, ABDOMEN, AND PELVIS WITH CONTRAST  TECHNIQUE: Multidetector CT imaging of the chest, abdomen and pelvis was performed following the standard protocol during bolus administration of intravenous contrast.  RADIATION DOSE REDUCTION: This exam was performed according to the departmental dose-optimization program which includes automated exposure control, adjustment of the mA and/or kV according to patient size and/or use of iterative reconstruction technique.  CONTRAST:  OMNIPAQUE  IOHEXOL  300 MG/ML  SOLN  COMPARISON:  CT chest abdomen pelvis, 02/07/2024, MR abdomen, 02/16/2024  FINDINGS: CT CHEST FINDINGS  Cardiovascular: Right chest port catheter. Normal heart size. No pericardial effusion.  Mediastinum/Nodes: No persistently enlarged mediastinal, hilar, or axillary lymph nodes. Thyroid  gland, trachea, and esophagus demonstrate no significant findings.  Lungs/Pleura: Mild centrilobular emphysema. Diffuse  bilateral bronchial wall thickening and a background of fine nodularity and ground-glass throughout the lungs. Unchanged spiculated nodule in the posterior right upper lobe measuring 1.6 x 1.2 cm (series 4, image 25). No pleural effusion or pneumothorax.  Musculoskeletal: No chest wall abnormality. No acute osseous findings.  CT ABDOMEN PELVIS FINDINGS  Hepatobiliary: Diminished size of a subcapsular lesion in hepatic segment IVB, no greater than 0.4 cm on today's examination with overlying subcapsular retraction, previously 1.0 cm (series 2, image 65). No new liver lesions. No gallstones, gallbladder wall thickening, or biliary dilatation.  Pancreas: Unremarkable. No pancreatic ductal dilatation or surrounding inflammatory changes.  Spleen: Normal in size without significant abnormality.  Adrenals/Urinary Tract: Adrenal glands are unremarkable. Kidneys are normal, without renal calculi, solid lesion, or hydronephrosis. Bladder is unremarkable.  Stomach/Bowel: Stomach is within normal limits. Appendix appears normal. No evidence of bowel wall thickening, distention, or inflammatory changes.  Vascular/Lymphatic: No significant vascular findings are present. No enlarged abdominal or pelvic lymph nodes.  Reproductive: No mass or other abnormality.  Other: No abdominal wall hernia or abnormality. No ascites.  Musculoskeletal: No acute osseous findings.  IMPRESSION: 1. Unchanged spiculated nodule in the posterior right upper lobe measuring 1.6 x 1.2 cm. 2. Diminished size of a subcapsular lesion in hepatic segment IVB, no greater than 0.4 cm on today's examination with overlying subcapsular retraction, previously 1.0 cm. No new liver lesions. 3. No persistently enlarged mediastinal, hilar, or axillary lymph nodes. 4. Emphysema and smoking-related respiratory bronchiolitis.  Emphysema (ICD10-J43.9).  Electronically Signed   By: Marolyn JONETTA Jaksch M.D.   On: 05/22/2024  06:47

## 2024-06-19 NOTE — Progress Notes (Signed)
 Patient presents today for follow up visit with DOROTHA Hope NP and treatment. (Keytruda , Alimta ). Vital signs within parameters for treatment. WBC 2.2 and ANC 1.3 today.   Message received from DOROTHA Hope NP to proceed with treatment. Labs reviewed by NP.   Treatment given today per MD orders. Tolerated infusion without adverse affects. Vital signs stable. No complaints at this time. Discharged from clinic ambulatory in stable condition. Alert and oriented x 3. F/U with [facility] as scheduled.

## 2024-06-19 NOTE — Progress Notes (Signed)
 [facility] CSW Progress Note  Visual Merchandiser met with patient to follow-up on emotional support.    Interventions: CSW met w/ pt to discuss possible application for SSDI.  Pt reports she is extremely fatigued as she continues treatment and is unsure if she can continue to work.  CSW discussed the application process w/ pt, as well as the length of time it takes to review the application.  Space provided for pt to reflect on the past year and express her feelings regarding her health journey.  Emotional support provided.        Follow Up Plan:  Patient will contact CSW with any support or resource needs    Devere JONELLE Manna, LCSW Clinical Social Worker [facility]

## 2024-06-20 LAB — T4: T4, Total: 9.1 ug/dL (ref 4.5–12.0)

## 2024-06-24 ENCOUNTER — Encounter: Payer: Self-pay | Admitting: Oncology

## 2024-06-24 ENCOUNTER — Inpatient Hospital Stay: Attending: Hematology | Admitting: Licensed Clinical Social Worker

## 2024-06-24 DIAGNOSIS — Z79620 Long term (current) use of immunosuppressive biologic: Secondary | ICD-10-CM | POA: Insufficient documentation

## 2024-06-24 DIAGNOSIS — F17210 Nicotine dependence, cigarettes, uncomplicated: Secondary | ICD-10-CM | POA: Insufficient documentation

## 2024-06-24 DIAGNOSIS — C7931 Secondary malignant neoplasm of brain: Secondary | ICD-10-CM | POA: Insufficient documentation

## 2024-06-24 DIAGNOSIS — Z5112 Encounter for antineoplastic immunotherapy: Secondary | ICD-10-CM | POA: Insufficient documentation

## 2024-06-24 DIAGNOSIS — C3411 Malignant neoplasm of upper lobe, right bronchus or lung: Secondary | ICD-10-CM | POA: Insufficient documentation

## 2024-06-24 DIAGNOSIS — C787 Secondary malignant neoplasm of liver and intrahepatic bile duct: Secondary | ICD-10-CM | POA: Insufficient documentation

## 2024-06-24 DIAGNOSIS — C7801 Secondary malignant neoplasm of right lung: Secondary | ICD-10-CM

## 2024-06-24 NOTE — Progress Notes (Signed)
 [facility] CSW Progress Note  Clinical Social Worker contacted patient by phone to follow-up on disability concerns.    Interventions: CSW received email confirmation from the [facility] that they are able to assist pt w/ applying for SSDI.  Per the [facility] the completed application will be mailed to the appropriate office in [place] on behalf of pt.  CSW contacted pt by phone to inform of the above.  Pt to follow up w/ CSW once she speaks w/ her employer about a timeline.        Follow Up Plan:  Patient will contact CSW with any support or resource needs    Julie JONELLE Manna, LCSW Clinical Social Worker [facility]

## 2024-06-27 ENCOUNTER — Encounter: Payer: Self-pay | Admitting: Oncology

## 2024-07-01 ENCOUNTER — Inpatient Hospital Stay: Admitting: Licensed Clinical Social Worker

## 2024-07-01 DIAGNOSIS — C7801 Secondary malignant neoplasm of right lung: Secondary | ICD-10-CM

## 2024-07-01 NOTE — Progress Notes (Signed)
 [facility] CSW Progress Note   Interventions: At request of patient letter sent to [name] verifying pt's diagnosis and prognosis.  CSW to remain available as appropriate.        Julie JONELLE Manna, LCSW Clinical Social Worker [facility]

## 2024-07-09 ENCOUNTER — Other Ambulatory Visit: Payer: Self-pay

## 2024-07-10 ENCOUNTER — Inpatient Hospital Stay

## 2024-07-10 ENCOUNTER — Inpatient Hospital Stay: Admitting: Oncology

## 2024-07-10 VITALS — BP 100/66 | HR 71 | Temp 98.1°F | Resp 18

## 2024-07-10 VITALS — BP 115/82 | HR 76 | Temp 98.1°F | Resp 16 | Wt 141.1 lb

## 2024-07-10 DIAGNOSIS — C7801 Secondary malignant neoplasm of right lung: Secondary | ICD-10-CM

## 2024-07-10 DIAGNOSIS — Z5112 Encounter for antineoplastic immunotherapy: Secondary | ICD-10-CM | POA: Diagnosis present

## 2024-07-10 DIAGNOSIS — F17210 Nicotine dependence, cigarettes, uncomplicated: Secondary | ICD-10-CM | POA: Diagnosis not present

## 2024-07-10 DIAGNOSIS — C7931 Secondary malignant neoplasm of brain: Secondary | ICD-10-CM | POA: Diagnosis present

## 2024-07-10 DIAGNOSIS — E032 Hypothyroidism due to medicaments and other exogenous substances: Secondary | ICD-10-CM

## 2024-07-10 DIAGNOSIS — C3411 Malignant neoplasm of upper lobe, right bronchus or lung: Secondary | ICD-10-CM | POA: Diagnosis present

## 2024-07-10 DIAGNOSIS — Z79620 Long term (current) use of immunosuppressive biologic: Secondary | ICD-10-CM | POA: Diagnosis not present

## 2024-07-10 DIAGNOSIS — C787 Secondary malignant neoplasm of liver and intrahepatic bile duct: Secondary | ICD-10-CM | POA: Diagnosis not present

## 2024-07-10 LAB — COMPREHENSIVE METABOLIC PANEL WITH GFR
ALT: 25 U/L (ref 0–44)
AST: 21 U/L (ref 15–41)
Albumin: 4.5 g/dL (ref 3.5–5.0)
Alkaline Phosphatase: 56 U/L (ref 38–126)
Anion gap: 14 (ref 5–15)
BUN: 7 mg/dL (ref 6–20)
CO2: 22 mmol/L (ref 22–32)
Calcium: 9.4 mg/dL (ref 8.9–10.3)
Chloride: 106 mmol/L (ref 98–111)
Creatinine, Ser: 0.54 mg/dL (ref 0.44–1.00)
GFR, Estimated: >60 mL/min (ref >60)
Glucose, Bld: 89 mg/dL (ref 70–99)
Potassium: 3.8 mmol/L (ref 3.5–5.1)
Sodium: 142 mmol/L (ref 135–145)
Total Bilirubin: 0.4 mg/dL (ref 0.0–1.2)
Total Protein: 6.7 g/dL (ref 6.5–8.1)

## 2024-07-10 LAB — CBC WITH DIFFERENTIAL/PLATELET
Abs Immature Granulocytes: 0.01 K/uL (ref 0.00–0.07)
Basophils Absolute: 0 K/uL (ref 0.0–0.1)
Basophils Relative: 1 %
Eosinophils Absolute: 0.1 K/uL (ref 0.0–0.5)
Eosinophils Relative: 4 %
HCT: 36.2 % (ref 36.0–46.0)
Hemoglobin: 12.5 g/dL (ref 12.0–15.0)
Immature Granulocytes: 0 %
Lymphocytes Relative: 17 %
Lymphs Abs: 0.6 K/uL — ABNORMAL LOW (ref 0.7–4.0)
MCH: 34.7 pg — ABNORMAL HIGH (ref 26.0–34.0)
MCHC: 34.5 g/dL (ref 30.0–36.0)
MCV: 100.6 fL — ABNORMAL HIGH (ref 80.0–100.0)
Monocytes Absolute: 0.4 K/uL (ref 0.1–1.0)
Monocytes Relative: 11 %
Neutro Abs: 2.3 K/uL (ref 1.7–7.7)
Neutrophils Relative %: 67 %
Platelets: 205 K/uL (ref 150–400)
RBC: 3.6 MIL/uL — ABNORMAL LOW (ref 3.87–5.11)
RDW: 13.1 % (ref 11.5–15.5)
WBC: 3.4 K/uL — ABNORMAL LOW (ref 4.0–10.5)
nRBC: 0 % (ref 0.0–0.2)

## 2024-07-10 LAB — TSH: TSH: 7.32 u[IU]/mL — ABNORMAL HIGH (ref 0.350–4.500)

## 2024-07-10 LAB — MAGNESIUM: Magnesium: 2.2 mg/dL (ref 1.7–2.4)

## 2024-07-10 MED ORDER — SODIUM CHLORIDE 0.9 % IV SOLN: INTRAVENOUS | Status: DC

## 2024-07-10 MED ORDER — ONDANSETRON HCL 4 MG/2ML IJ SOLN
8.0000 mg | Freq: Once | INTRAMUSCULAR | Status: AC
  Administered 2024-07-10: 14:00:00 8 mg via INTRAVENOUS
  Filled 2024-07-10: qty 4

## 2024-07-10 MED ORDER — SODIUM CHLORIDE 0.9 % IV SOLN
200.0000 mg | Freq: Once | INTRAVENOUS | Status: AC
  Administered 2024-07-10: 14:00:00 200 mg via INTRAVENOUS
  Filled 2024-07-10: qty 8

## 2024-07-10 MED ORDER — SODIUM CHLORIDE 0.9 % IV SOLN
500.0000 mg/m2 | Freq: Once | INTRAVENOUS | Status: AC
  Administered 2024-07-10: 15:00:00 900 mg via INTRAVENOUS
  Filled 2024-07-10: qty 20

## 2024-07-10 NOTE — Progress Notes (Signed)
 Patient presents today for Keytruda , Alimta  and follow up appointment with J.Burns NP. Lab work and vital signs are within parameters for treatment today. Patient up front in room for appointment at 13:45 pm. TSH 7.320.

## 2024-07-10 NOTE — Progress Notes (Signed)
 Patient presents today for chemotherapy Keytruda /Alimta  infusion. Patient is in satisfactory condition with no new complaints voiced.  Vital signs are stable.  Labs reviewed by Delon Hope, NP during the office visit and all labs are within treatment parameters.  We will proceed with treatment per MD orders.   Treatment given today per MD orders. Tolerated infusion without adverse affects. Vital signs stable. No complaints at this time. Discharged from clinic ambulatory in stable condition. Alert and oriented x 3. F/U with [facility] as scheduled.

## 2024-07-10 NOTE — Progress Notes (Unsigned)
 Patient Care Team: Zollie Lowers, MD as PCP - General (Family Medicine) Dolphus Reiter, MD as Consulting Physician (Rheumatology) Davonna Siad, MD as Medical Oncologist (Medical Oncology) Celestia Joesph SQUIBB, RN as Oncology Nurse Navigator (Medical Oncology) Tamea Dedra CROME, MD as Consulting Physician (Pulmonary Disease) Darlean Ozell NOVAK, MD as Consulting Physician (Pulmonary Disease)  Clinic Day:  05/29/2024  Referring physician: Zollie Lowers, MD   CHIEF COMPLAINT:  CC: Metastatic lung adenocarcinoma    ASSESSMENT & PLAN:   Assessment & Plan: Julie Horn  is a 48 y.o. 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   S/p cerebellar lesion resection consistent with lung primary.   Oncology history as below. PET scan showed limited disease to lung and lymph nodes.  Discussed at tumor board, surgery is not a feasible option secondary to lymph node involvement.  Recommended chemoradiation. Caris: PD-L1: 100% Completed chemoRT on 01/08/2024 with carboplatin  and paclitaxel  Recent MRI brain with new sites of metastasis and CT scan within evidence of new liver lesion with response locally in the lung. MRI liver with confirmation of liver metastasis. Discussion at the thoracic tumor board, and started on chemoimmunotherapy on 02/28/2024: Started on carboplatin , pemetrexed  and pembrolizumab .  Currently on maintenance pemetrexed  and pembrolizumab    -Cycle 6-day 1 today.  Patient tolerated previous cycle well with no significant side effects. - Labs reviewed today:CMP: WNL, magnesium: 2.3, CBC: WBC: 2.2, ANC: 1300, hemoglobin: 12.9, platelets: 175 - Recent CT scan shows good response in the liver lesion and stable right upper lobe lesion.  No lymphadenopathy seen at this  time. -Treatment was held last week due to low white counts.  - As counts have recovered, continue current treatment plan. - Educated patient on reaching out if she has rash, shortness of breath, abdominal pain, diarrhea that persists - Will repeat CT scan in 3 months (around 08/18/24)   Return to clinic before next cycle of chemoimmunotherapy.  Metastasis to brain Cerebellar lesion s/p resection consistent with the poorly differentiated adenocarcinoma of lung primary. Patient completed gamma knife at [facility].  Currently on a clinical trial assessing gamma knife versus gamma tile Completed steroid taper. Repeat MRI brain with multiple new brain metastasis.  Completed SBRT at [facility] Recent MRI brain showed very small new lesions but these were evaluated by radiation oncology there and will be monitored for now   - Continue to follow with [facility].  Chemotherapy-induced leukopenia and thrombocytopenia Leukopenia and thrombocytopenia likely secondary to Pemetrexed .  Treatment was felt most recently due to these low counts. Most recent labs have improved.  -Proceed with treatment today.  No dose reduction needed but will continue to monitor.  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 Symptoms included irritability and fatigue. -TSH improved to 10.9 after increasing levothyroxine .  Continue at this dose for now.  Will recheck labs at next visit.  - Recently levothyroxine  increased to 75 mcg daily.  - Will monitor TSH levels every 6 weeks and adjust dose as needed.  Pembrolizumab -induced skin rash Skin rash likely induced by pembrolizumab , located over bilateral thighs and stomach. Hydrocortisone had limited effect. Rash was itchy, especially at night.  - Prescribed triamcinolone  0.1% cream, apply twice daily. - Will increase triamcinolone  to 0.5% if rash does not improve. - Consider  Benadryl or cetirizine  for  itch relief.  Headache, stable, managed with tramadol  and Fioricet Occasional severe headaches managed with tramadol . Neurologist consulted, headaches not frequent. - Continue tramadol  and Fioricet for headache management.  Diarrhea, likely secondary to magnesium supplements. Diarrhea likely secondary to magnesium supplements. - Discontinued magnesium supplementation. - Will keep an eye for immunotherapy induced colitis  Fluid retention, likely related to hypothyroidism Intermittent fluid retention likely related to hypothyroidism, with symptoms of tightness and bruising. - Increased levothyroxine  dose to address hypothyroidism and associated fluid retention.    The patient understands the plans discussed today and is in agreement with them.  She knows to contact our office if she develops concerns prior to her next appointment.  20 minutes of total time was spent for this patient encounter, including preparation,face-to-face counseling with the patient and coordination of care, physical exam, and documentation of the encounter.    Julie Horn Hope, NP  [facility] [place] - A DEPT OF [facility] [address]  [place] [address] Dept: [phone] Dept Fax: [phone]   No orders of the defined types were placed in this encounter.    ONCOLOGY HISTORY:   Diagnosis: Right Lung metastatic adenocarcinoma  -09/25/2023: CT CAP: Spiculated mass in the posterior right pulmonary apex measuring 2.6 x 1.4 cm, consistent with primary bronchogenic malignancy. Enlarged right hilar lymph node measuring 1.5 x 1.2 cm, consistent with nodal metastatic disease. Tiny hypodensities in the central liver, incompletely characterized, although statistically likely benign cysts or hemangiomas. Small metastases not strictly excluded. Soft tissue nodule within the right gluteal subcutaneous fat; given location most  likely an injection granuloma, however subcutaneous soft tissue metastases are occasionally seen in metastatic lung malignancy. -09/25/2023: MRI Brain: Heterogeneously contrast-enhancing mass of the right cerebellar hemisphere with a large amount of surrounding vasogenic edema. Metastasis and hemangioblastoma are the most common infratentorial tumors in adults. Astrocytoma is another possibility. Mass effect on the fourth ventricle without hydrocephalus. -10/11/2023: Right cerebellar resection.  Pathology: Metastatic poorly differentiated adenocarcinoma consistent with lung primary. -10/05/2023: Initial PET:  Hypermetabolic RIGHT upper lobe lung nodule consistent primary bronchogenic carcinoma. Mildly hypermetabolic RIGHT lower paratracheal and RIGHT hilar lymph nodes are concerning for nodal metastasis. No contralateral hypermetabolic lymph nodes. No evidence of distant metastatic disease. -11/13/2023-11/17/2023: Radiation to right cerebellar area completed -11/27/2023-01/08/2024: ChemoRT with carboplatin  and paclitaxel   -02/07/2024: CT CAP:  New 10 mm subcapsular low-density lesion in the anterior left liver. Spiculated pulmonary nodule posterior right upper lobe is slightly decreased in size in the interval. Right hilar lymph node measured previously at 12 mm short axis is 6 mm short axis today. No mediastinal lymphadenopathy by CT size criteria. -02/16/2024: MRI Liver: 1.4 cm rim-enhancing mass in segment 4B of the left hepatic lobe, highly suspicious for liver metastasis. No other sites of metastatic disease identified within the abdomen. -02/27/2024-current: Carboplatin , pemetrexed  and pembrolizumab  -05/20/2024: CT CAP: Stable to improved disease  Current Treatment: Maintenance pembrolizumab  and pemetrexed   INTERVAL HISTORY:   History of Present Illness Julie Horn is a 48 year old female with lung cancer and hypothyroidism who presents for follow-up.  Treatment was held last week  secondary to neutropenia.  She is here for reevaluation and next cycle of treatment.  Reports pain in her hips and knees that is chronic and she is wondering if she is able to take NSAIDs such as meloxicam  or ibuprofen.  She takes Tylenol  at times.  Reports occasional constipation and nausea.  Has headaches intermittently.  Reports her biggest concern is her fatigue.  Most recently, her Synthroid  was increased to 75 mcg daily and she is hoping that is helping her levels.  Rash on her abdomen has improved.  She continues hydrocortisone cream.  She takes trazodone  and melatonin for sleep but avoids Benadryl due to its hangover effect.   She experiences headaches, for which she takes tramadol  when they become severe. She has seen a neurologist for this issue and reports a decrease in headache frequency, although the intensity remains high when they occur. She is cautious about taking medications like Tylenol  due to concerns about liver health, although her liver function tests are normal.   I have reviewed the past medical history, past surgical history, social history and family history with the patient and they are unchanged from previous note.  ALLERGIES:  is allergic to levofloxacin, sulfa antibiotics, azithromycin, doxycycline , latex, and wound dressing adhesive.  MEDICATIONS:  Current Outpatient Medications  Medication Sig Dispense Refill   acetaminophen  (TYLENOL ) 500 MG tablet Take 1,000 mg by mouth every 6 (six) hours as needed for mild pain (pain score 1-3). Take 2 tablets (1,000 mg total) by mouth every six (6) hours.     Albuterol -Budesonide  (AIRSUPRA ) 90-80 MCG/ACT AERO Inhale 2 puffs into the lungs every 4 (four) hours as needed. 10.7 g 11   ascorbic acid (VITAMIN C) 500 MG tablet Take 500 mg by mouth daily. Take 1 tablet (500 mg total) by mouth daily     butalbital -acetaminophen -caffeine  (FIORICET) 50-325-40 MG tablet Take 1 tablet by mouth every 4 (four) hours as needed for headache.  14 tablet 1   cetirizine  (ZYRTEC ) 10 MG chewable tablet Chew 10 mg by mouth.     co-enzyme Q-10 30 MG capsule Take 30 mg by mouth 3 (three) times daily. Take 1 capsule (30 mg total) by mouth Three (3) times a day.     Cyanocobalamin  (VITAMIN B 12) 500 MCG TABS Take 500 mg by mouth daily.     folic acid  (FOLVITE ) 1 MG tablet Take 1 tablet (1 mg total) by mouth daily. 30 tablet 6   levothyroxine  (SYNTHROID ) 75 MCG tablet Take 1 tablet (75 mcg total) by mouth daily before breakfast. 60 tablet 2   magnesium oxide (MAG-OX) 400 (240 Mg) MG tablet Take 400 mg by mouth daily. Take 1 tablet (400 mg total) by mouth daily.     melatonin 1 MG TABS tablet Take 1 mg by mouth at bedtime. Take 5 tablets (5 mg total) by mouth nightly.     ondansetron  (ZOFRAN -ODT) 4 MG disintegrating tablet Take 4 mg by mouth every 8 (eight) hours as needed.     Pembrolizumab  (KEYTRUDA  IV) Inject 1 Units into the vein every 21 ( twenty-one) days.     PEMEtrexed  Disodium (ALIMTA  IV) Inject 1 Units into the vein every 21 ( twenty-one) days.     sennosides-docusate sodium (SENOKOT-S) 8.6-50 MG tablet Take 2 tablets by mouth daily.     traMADol  (ULTRAM ) 50 MG tablet Take 1 tablet (50 mg total) by mouth every 12 (twelve) hours as needed for severe pain (pain score 7-10) (headaches). 30 tablet 0   traZODone  (DESYREL ) 150 MG tablet USE FROM 1/3 TO 1 TABLET NIGHTLY AS NEEDED FOR SLEEP. (Patient taking differently: Use from 1/6 tablet nightly as needed for sleep.) 90 tablet 1   triamcinolone  cream (KENALOG ) 0.1 % Apply 1 Application topically 2 (two) times daily. 60 g 2   No current facility-administered medications for this visit.  REVIEW OF SYSTEMS:   Review of Systems  Constitutional:  Positive for malaise/fatigue.  Gastrointestinal:  Positive for constipation and nausea.  Musculoskeletal:  Positive for joint pain.  Neurological:  Positive for headaches.     VITALS:  Last menstrual period 07/26/2019.  Wt Readings from Last  3 Encounters:  07/10/24 141 lb 1.5 oz (64 kg)  06/19/24 141 lb 5 oz (64.1 kg)  05/29/24 142 lb 12.8 oz (64.8 kg)    There is no height or weight on file to calculate BMI.  Performance status (ECOG): 0 - Asymptomatic  PHYSICAL EXAM:   Physical Exam Constitutional:      Appearance: Normal appearance.  Cardiovascular:     Rate and Rhythm: Normal rate and regular rhythm.  Pulmonary:     Effort: Pulmonary effort is normal.     Breath sounds: Normal breath sounds.  Abdominal:     General: Bowel sounds are normal.     Palpations: Abdomen is soft.  Musculoskeletal:        General: No swelling. Normal range of motion.  Neurological:     Mental Status: She is alert and oriented to person, place, and time. Mental status is at baseline.     LABORATORY DATA:  I have reviewed the data as listed     Component Value Date/Time   NA 142 07/10/2024 1212   NA 145 (H) 05/22/2023 1054   K 3.8 07/10/2024 1212   CL 106 07/10/2024 1212   CO2 22 07/10/2024 1212   GLUCOSE 89 07/10/2024 1212   BUN 7 07/10/2024 1212   BUN 7 05/22/2023 1054   CREATININE 0.54 07/10/2024 1212   CALCIUM 9.4 07/10/2024 1212   PROT 6.7 07/10/2024 1212   PROT 7.4 05/22/2023 1054   ALBUMIN 4.5 07/10/2024 1212   ALBUMIN 4.8 05/22/2023 1054   AST 21 07/10/2024 1212   ALT 25 07/10/2024 1212   ALKPHOS 56 07/10/2024 1212   BILITOT 0.4 07/10/2024 1212   BILITOT 0.5 05/22/2023 1054   GFRNONAA >60 07/10/2024 1212     Lab Results  Component Value Date   WBC 3.4 (L) 07/10/2024   NEUTROABS 2.3 07/10/2024   HGB 12.5 07/10/2024   HCT 36.2 07/10/2024   MCV 100.6 (H) 07/10/2024   PLT 205 07/10/2024    Latest Reference Range & Units 05/29/24 08:40  TSH 0.350 - 4.500 uIU/mL 34.100 (H)  T4,Free(Direct) 0.61 - 1.12 ng/dL 9.46 (L)  (H): Data is abnormally high (L): Data is abnormally low   RADIOGRAPHIC STUDIES: I have personally reviewed the radiological images as listed and agreed with the findings in the  report.  CT CHEST ABDOMEN PELVIS W CONTRAST CLINICAL DATA:  Metastatic disease evaluation, lung cancer * Tracking Code: BO *  EXAM: CT CHEST, ABDOMEN, AND PELVIS WITH CONTRAST  TECHNIQUE: Multidetector CT imaging of the chest, abdomen and pelvis was performed following the standard protocol during bolus administration of intravenous contrast.  RADIATION DOSE REDUCTION: This exam was performed according to the departmental dose-optimization program which includes automated exposure control, adjustment of the mA and/or kV according to patient size and/or use of iterative reconstruction technique.  CONTRAST:  OMNIPAQUE  IOHEXOL  300 MG/ML  SOLN  COMPARISON:  CT chest abdomen pelvis, 02/07/2024, MR abdomen, 02/16/2024  FINDINGS: CT CHEST FINDINGS  Cardiovascular: Right chest port catheter. Normal heart size. No pericardial effusion.  Mediastinum/Nodes: No persistently enlarged mediastinal, hilar, or axillary lymph nodes. Thyroid  gland, trachea, and esophagus demonstrate no significant findings.  Lungs/Pleura: Mild centrilobular emphysema. Diffuse  bilateral bronchial wall thickening and a background of fine nodularity and ground-glass throughout the lungs. Unchanged spiculated nodule in the posterior right upper lobe measuring 1.6 x 1.2 cm (series 4, image 25). No pleural effusion or pneumothorax.  Musculoskeletal: No chest wall abnormality. No acute osseous findings.  CT ABDOMEN PELVIS FINDINGS  Hepatobiliary: Diminished size of a subcapsular lesion in hepatic segment IVB, no greater than 0.4 cm on today's examination with overlying subcapsular retraction, previously 1.0 cm (series 2, image 65). No new liver lesions. No gallstones, gallbladder wall thickening, or biliary dilatation.  Pancreas: Unremarkable. No pancreatic ductal dilatation or surrounding inflammatory changes.  Spleen: Normal in size without significant abnormality.  Adrenals/Urinary Tract:  Adrenal glands are unremarkable. Kidneys are normal, without renal calculi, solid lesion, or hydronephrosis. Bladder is unremarkable.  Stomach/Bowel: Stomach is within normal limits. Appendix appears normal. No evidence of bowel wall thickening, distention, or inflammatory changes.  Vascular/Lymphatic: No significant vascular findings are present. No enlarged abdominal or pelvic lymph nodes.  Reproductive: No mass or other abnormality.  Other: No abdominal wall hernia or abnormality. No ascites.  Musculoskeletal: No acute osseous findings.  IMPRESSION: 1. Unchanged spiculated nodule in the posterior right upper lobe measuring 1.6 x 1.2 cm. 2. Diminished size of a subcapsular lesion in hepatic segment IVB, no greater than 0.4 cm on today's examination with overlying subcapsular retraction, previously 1.0 cm. No new liver lesions. 3. No persistently enlarged mediastinal, hilar, or axillary lymph nodes. 4. Emphysema and smoking-related respiratory bronchiolitis.  Emphysema (ICD10-J43.9).  Electronically Signed   By: Marolyn JONETTA Jaksch M.D.   On: 05/22/2024 06:47

## 2024-07-10 NOTE — Patient Instructions (Signed)
 [facility] [place] - A DEPT OF . [facility]  Discharge Instructions: Thank you for choosing [facility] to provide your oncology and hematology care.  If you have a lab appointment with the Cancer Center - please note that after April 8th, 2024, all labs will be drawn in the cancer center.  You do not have to check in or register with the main entrance as you have in the past but will complete your check-in in the cancer center.  Wear comfortable clothing and clothing appropriate for easy access to any Portacath or PICC line.   We strive to give you quality time with your provider. You may need to reschedule your appointment if you arrive late (15 or more minutes).  Arriving late affects you and other patients whose appointments are after yours.  Also, if you miss three or more appointments without notifying the office, you may be dismissed from the clinic at the providers discretion.      For prescription refill requests, have your pharmacy contact our office and allow 72 hours for refills to be completed.    Today you received the following chemotherapy and/or immunotherapy agents Keytruda /Almita      To help prevent nausea and vomiting after your treatment, we encourage you to take your nausea medication as directed.  BELOW ARE SYMPTOMS THAT SHOULD BE REPORTED IMMEDIATELY: *FEVER GREATER THAN 100.4 F (38 C) OR HIGHER *CHILLS OR SWEATING *NAUSEA AND VOMITING THAT IS NOT CONTROLLED WITH YOUR NAUSEA MEDICATION *UNUSUAL SHORTNESS OF BREATH *UNUSUAL BRUISING OR BLEEDING *URINARY PROBLEMS (pain or burning when urinating, or frequent urination) *BOWEL PROBLEMS (unusual diarrhea, constipation, pain near the anus) TENDERNESS IN MOUTH AND THROAT WITH OR WITHOUT PRESENCE OF ULCERS (sore throat, sores in mouth, or a toothache) UNUSUAL RASH, SWELLING OR PAIN  UNUSUAL VAGINAL DISCHARGE OR ITCHING   Items with * indicate a potential emergency and should be  followed up as soon as possible or go to the Emergency Department if any problems should occur.  Please show the CHEMOTHERAPY ALERT CARD or IMMUNOTHERAPY ALERT CARD at check-in to the Emergency Department and triage nurse.  Should you have questions after your visit or need to cancel or reschedule your appointment, please contact [facility]  - A DEPT OF [facility] [phone]  and follow the prompts.  Office hours are 8:00 a.m. to 4:30 p.m. Monday - Friday. Please note that voicemails left after 4:00 p.m. may not be returned until the following business day.  We are closed weekends and major holidays. You have access to a nurse at all times for urgent questions. Please call the main number to the clinic [phone] and follow the prompts.  For any non-urgent questions, you may also contact your provider using MyChart. We now offer e-Visits for anyone 70 and older to request care online for non-urgent symptoms. For details visit [email or web address].   Also download the MyChart app! Go to the app store, search MyChart, open the app, select [place], and log in with your MyChart username and password.

## 2024-07-11 ENCOUNTER — Encounter: Payer: Self-pay | Admitting: Oncology

## 2024-07-11 NOTE — Addendum Note (Signed)
 Addended by: Namiyah Grantham on: 07/11/2024 12:54 PM   Modules accepted: Level of Service

## 2024-07-12 ENCOUNTER — Inpatient Hospital Stay: Admitting: Licensed Clinical Social Worker

## 2024-07-12 DIAGNOSIS — C7801 Secondary malignant neoplasm of right lung: Secondary | ICD-10-CM

## 2024-07-12 NOTE — Progress Notes (Signed)
 [facility] CSW Progress Note    Interventions: CSW submitted referral to the [facility] on behalf of client.      Follow Up Plan:  Patient will contact CSW with any support or resource needs    Devere JONELLE Manna, LCSW Clinical Social Worker [facility]

## 2024-07-21 ENCOUNTER — Other Ambulatory Visit: Payer: Self-pay

## 2024-07-22 ENCOUNTER — Encounter: Payer: Self-pay | Admitting: *Deleted

## 2024-07-24 ENCOUNTER — Other Ambulatory Visit: Payer: Self-pay | Admitting: Oncology

## 2024-07-24 DIAGNOSIS — C7801 Secondary malignant neoplasm of right lung: Secondary | ICD-10-CM

## 2024-07-27 ENCOUNTER — Encounter: Payer: Self-pay | Admitting: Oncology

## 2024-07-30 ENCOUNTER — Other Ambulatory Visit: Payer: Self-pay

## 2024-07-30 NOTE — Progress Notes (Signed)
 " Patient Care Team: Zollie Lowers, MD as PCP - General (Family Medicine) Dolphus Reiter, MD as Consulting Physician (Rheumatology) Davonna Siad, MD as Medical Oncologist (Medical Oncology) Celestia Joesph SQUIBB, RN as Oncology Nurse Navigator (Medical Oncology) Tamea Dedra CROME, MD as Consulting Physician (Pulmonary Disease) Darlean Ozell NOVAK, MD as Consulting Physician (Pulmonary Disease)  Clinic Day:  08/01/2023  Referring physician: Zollie Lowers, MD   CHIEF COMPLAINT:  CC: Metastatic lung adenocarcinoma    ASSESSMENT & PLAN:   Assessment & Plan: Julie Horn  is a 49 y.o. 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   S/p cerebellar lesion resection consistent with lung primary.   Oncology history as below. PET scan showed limited disease to lung and lymph nodes.  Discussed at tumor board, surgery is not a feasible option secondary to lymph node involvement.  Recommended chemoradiation. Caris: PD-L1: 100% Completed chemoRT on 01/08/2024 with carboplatin  and paclitaxel  Recent MRI brain with new sites of metastasis and CT scan within evidence of new liver lesion with response locally in the lung. MRI liver with confirmation of liver metastasis. Discussion at the thoracic tumor board, and started on chemoimmunotherapy on 02/28/2024: Started on carboplatin , pemetrexed  and pembrolizumab .  Currently on maintenance pemetrexed  and pembrolizumab    -Cycle 8-day 1 today.  Patient tolerated previous cycle well with no significant side effects - Labs reviewed today:CMP: WNL,  CBC: WBC: 3.1, ANC: 1800, hemoglobin: 13.2, platelets: 204 -Physical exam stable. Will proceed with treatment today.  -Can consider dose reducing pemetrexed  with next cycle or just continuing pembrolizumab   alone. - Educated patient on reaching out if she has rash, shortness of breath, abdominal pain, diarrhea that persists - Will repeat CT scan in 3 months.Due prior to next visit. Ordered today.   Return to clinic before next cycle of chemoimmunotherapy with imaging.  Metastasis to brain Cerebellar lesion s/p resection consistent with the poorly differentiated adenocarcinoma of lung primary. Patient completed gamma knife at [facility].  Currently on a clinical trial assessing gamma knife versus gamma tile Completed steroid taper. Repeat MRI brain with multiple new brain metastasis.  Completed SBRT at [facility] Recent MRI brain showed new brain mets at [facility]. Underwent further cyberknife resection.    - Continue to follow with [facility]. -Repeat MRI ordered in 3 months  Hypothyroidism Managed with levothyroxine . Significant symptom improvement, mild hand edema persists. Thyroid  levels improving as expected  - Continued levothyroxine  at current dose. - Monitored for symptom improvement and hand edema.  Pembrolizumab -induced skin rash Skin rash likely induced by pembrolizumab , located over bilateral thighs and stomach. Hydrocortisone had limited effect. Rash was itchy, especially at night. Resolved at this time  - Cotninue triamcinolone  0.1% cream, apply twice daily. - Will increase triamcinolone  to 0.5% if rash does not improve. - Consider Benadryl or cetirizine  for itch relief.  Headache, stable, managed with tramadol  and Fioricet Occasional severe headaches managed with tramadol . Neurologist consulted, headaches not frequent. - Continue tramadol  and Fioricet for headache management.  Chemotherapy-induced gastrointestinal toxicity Intermittent diarrhea and constipation persist despite discontinuation of magnesium supplementation. Symptoms are cyclical without severe episodes.   The patient understands the plans discussed today and is in agreement with them.  She knows to contact our office  if she develops concerns prior to her next appointment.  15 minutes of total time was spent for  this patient encounter, including preparation,face-to-face counseling with the patient and coordination of care, physical exam, and documentation of the encounter.    Mickiel Dry, MD  [facility] [place] - A DEPT OF [facility] [address] [place] [address] Dept: [phone] Dept Fax: [phone]   Orders Placed This Encounter  Procedures   CT CHEST ABDOMEN PELVIS W CONTRAST    Standing Status:   Future    Expected Date:   08/31/2024    Expiration Date:   07/31/2025    If indicated for the ordered procedure, I authorize the administration of contrast media per Radiology protocol:   Yes    Does the patient have a contrast media/X-ray dye allergy?:   No    Preferred imaging location?:   [facility]    If indicated for the ordered procedure, I authorize the administration of oral contrast media per Radiology protocol:   Yes     ONCOLOGY HISTORY:   Diagnosis: Right Lung metastatic adenocarcinoma  -09/25/2023: CT CAP: Spiculated mass in the posterior right pulmonary apex measuring 2.6 x 1.4 cm, consistent with primary bronchogenic malignancy. Enlarged right hilar lymph node measuring 1.5 x 1.2 cm, consistent with nodal metastatic disease. Tiny hypodensities in the central liver, incompletely characterized, although statistically likely benign cysts or hemangiomas. Small metastases not strictly excluded. Soft tissue nodule within the right gluteal subcutaneous fat; given location most likely an injection granuloma, however subcutaneous soft tissue metastases are occasionally seen in metastatic lung malignancy. -09/25/2023: MRI Brain: Heterogeneously contrast-enhancing mass of the right cerebellar hemisphere with a large amount of surrounding vasogenic edema. Metastasis and hemangioblastoma are the most common infratentorial  tumors in adults. Astrocytoma is another possibility. Mass effect on the fourth ventricle without hydrocephalus. -10/11/2023: Right cerebellar resection.  Pathology: Metastatic poorly differentiated adenocarcinoma consistent with lung primary. -10/05/2023: Initial PET:  Hypermetabolic RIGHT upper lobe lung nodule consistent primary bronchogenic carcinoma. Mildly hypermetabolic RIGHT lower paratracheal and RIGHT hilar lymph nodes are concerning for nodal metastasis. No contralateral hypermetabolic lymph nodes. No evidence of distant metastatic disease. -11/13/2023-11/17/2023: Radiation to right cerebellar area completed -11/27/2023-01/08/2024: ChemoRT with carboplatin  and paclitaxel   -02/07/2024: CT CAP:  New 10 mm subcapsular low-density lesion in the anterior left liver. Spiculated pulmonary nodule posterior right upper lobe is slightly decreased in size in the interval. Right hilar lymph node measured previously at 12 mm short axis is 6 mm short axis today. No mediastinal lymphadenopathy by CT size criteria. -02/16/2024: MRI Liver: 1.4 cm rim-enhancing mass in segment 4B of the left hepatic lobe, highly suspicious for liver metastasis. No other sites of metastatic disease identified within the abdomen. -02/27/2024-current: Carboplatin , pemetrexed  and pembrolizumab  -05/20/2024: CT CAP: Stable to improved disease -06/26/2024: MRI brain: New 5 mm enhancing focus in the right parafalcine frontal lobe with surrounding vasogenic edema. Slightly increased size of a few of the previously characterized nodular leptomeningeal enhancing foci as detailed in the body 2 of which in the high frontal convexities demonstrate increased surrounding vasogenic edema.  -07/23/2024: Cyberknife to brain mets.   Current Treatment: Maintenance pembrolizumab  and pemetrexed   INTERVAL HISTORY:   Discussed the use of AI scribe software for clinical note transcription with the patient, who gave verbal consent to  proceed.  History of Present Illness Gracelyn Lexii Walsh is a 49 year old female with metastatic right lung cancer who presents for post-radiation and chemotherapy follow-up.  She completed radiation therapy last week and subsequently experienced mild headaches,  generalized fatigue, and a sensation of swelling, all of which have progressively improved. As of yesterday, she felt nearly back to baseline and continues to do well today.  She remains on pemetrexed  chemotherapy and immunotherapy, which she describes as well-tolerated. She denies numbness, paresthesias, or significant neurotoxicity. She reports transient subjective improvement in well-being following B12 injections.  She continues levothyroxine  for hypothyroidism, with marked improvement in energy and overall symptoms. She reports persistent puffiness, particularly in her hands, resulting in difficulty wearing rings. There is no significant lower extremity edema or associated functional limitation.  She experiences intermittent rash localized to the port site, attributed to adhesive irritation. The rash has improved and responds to topical therapy; escalation of treatment has not been required.  She reports ongoing gastrointestinal symptoms, including alternating constipation and diarrhea, with up to five loose stools per day. Discontinuation of magnesium supplementation did not resolve the diarrhea, and the pattern remains unchanged, consistent with chemotherapy-related toxicity.  She is currently not working and is hopeful that reduced stress will aid her recovery.   I have reviewed the past medical history, past surgical history, social history and family history with the patient and they are unchanged from previous note.  ALLERGIES:  is allergic to levofloxacin, sulfa antibiotics, azithromycin, doxycycline , latex, and wound dressing adhesive.  MEDICATIONS:  Current Outpatient Medications  Medication Sig Dispense Refill    acetaminophen  (TYLENOL ) 500 MG tablet Take 1,000 mg by mouth every 6 (six) hours as needed for mild pain (pain score 1-3). Take 2 tablets (1,000 mg total) by mouth every six (6) hours.     Albuterol -Budesonide  (AIRSUPRA ) 90-80 MCG/ACT AERO Inhale 2 puffs into the lungs every 4 (four) hours as needed. 10.7 g 11   ascorbic acid (VITAMIN C) 500 MG tablet Take 500 mg by mouth daily. Take 1 tablet (500 mg total) by mouth daily     butalbital -acetaminophen -caffeine  (FIORICET) 50-325-40 MG tablet Take 1 tablet by mouth every 4 (four) hours as needed for headache. 14 tablet 1   cetirizine  (ZYRTEC ) 10 MG chewable tablet Chew 10 mg by mouth.     co-enzyme Q-10 30 MG capsule Take 30 mg by mouth 3 (three) times daily. Take 1 capsule (30 mg total) by mouth Three (3) times a day.     Cyanocobalamin  (VITAMIN B 12) 500 MCG TABS Take 500 mg by mouth daily.     folic acid  (FOLVITE ) 1 MG tablet Take 1 tablet (1 mg total) by mouth daily. 30 tablet 6   levothyroxine  (SYNTHROID ) 75 MCG tablet Take 1 tablet (75 mcg total) by mouth daily before breakfast. 60 tablet 2   magnesium oxide (MAG-OX) 400 (240 Mg) MG tablet Take 400 mg by mouth daily. Take 1 tablet (400 mg total) by mouth daily.     melatonin 1 MG TABS tablet Take 1 mg by mouth at bedtime. Take 5 tablets (5 mg total) by mouth nightly.     ondansetron  (ZOFRAN -ODT) 4 MG disintegrating tablet Take 4 mg by mouth every 8 (eight) hours as needed.     Pembrolizumab  (KEYTRUDA  IV) Inject 1 Units into the vein every 21 ( twenty-one) days.     PEMEtrexed  Disodium (ALIMTA  IV) Inject 1 Units into the vein every 21 ( twenty-one) days.     sennosides-docusate sodium (SENOKOT-S) 8.6-50 MG tablet Take 2 tablets by mouth daily.     traMADol  (ULTRAM ) 50 MG tablet Take 1 tablet (50 mg total) by mouth every 12 (twelve) hours as needed for severe pain (pain score 7-10) (  headaches). 30 tablet 0   traZODone  (DESYREL ) 150 MG tablet USE FROM 1/3 TO 1 TABLET NIGHTLY AS NEEDED FOR SLEEP.  (Patient taking differently: Use from 1/6 tablet nightly as needed for sleep.) 90 tablet 1   triamcinolone  cream (KENALOG ) 0.1 % Apply 1 Application topically 2 (two) times daily. 60 g 2   No current facility-administered medications for this visit.    VITALS:  Last menstrual period 07/26/2019.  Wt Readings from Last 3 Encounters:  07/31/24 142 lb 3.2 oz (64.5 kg)  07/10/24 141 lb 1.5 oz (64 kg)  06/19/24 141 lb 5 oz (64.1 kg)    There is no height or weight on file to calculate BMI.  Performance status (ECOG): 0 - Asymptomatic  PHYSICAL EXAM:   GENERAL:alert, no distress and comfortable SKIN: Previous rash significantly improved EYES: normal, Conjunctiva are pink and non-injected, sclera clear OROPHARYNX:no exudate, no erythema and lips, buccal mucosa, and tongue normal  NECK: supple, thyroid  normal size, non-tender, without nodularity LYMPH:  no palpable lymphadenopathy in the cervical, axillary or inguinal LUNGS: clear to auscultation and percussion with normal breathing effort HEART: regular rate & rhythm and no murmurs and no lower extremity edema ABDOMEN:abdomen soft, non-tender and normal bowel sounds Musculoskeletal:no cyanosis of digits and no clubbing  NEURO: alert & oriented x 3 with fluent speech    LABORATORY DATA:  I have reviewed the data as listed     Component Value Date/Time   NA 141 07/31/2024 0935   NA 145 (H) 05/22/2023 1054   K 3.9 07/31/2024 0935   CL 105 07/31/2024 0935   CO2 22 07/31/2024 0935   GLUCOSE 85 07/31/2024 0935   BUN 8 07/31/2024 0935   BUN 7 05/22/2023 1054   CREATININE 0.57 07/31/2024 0935   CALCIUM 9.7 07/31/2024 0935   PROT 7.0 07/31/2024 0935   PROT 7.4 05/22/2023 1054   ALBUMIN 4.6 07/31/2024 0935   ALBUMIN 4.8 05/22/2023 1054   AST 30 07/31/2024 0935   ALT 50 (H) 07/31/2024 0935   ALKPHOS 58 07/31/2024 0935   BILITOT 0.3 07/31/2024 0935   BILITOT 0.5 05/22/2023 1054   GFRNONAA >60 07/31/2024 0935     Lab Results   Component Value Date   WBC 3.1 (L) 07/31/2024   NEUTROABS 1.8 07/31/2024   HGB 13.2 07/31/2024   HCT 37.9 07/31/2024   MCV 100.8 (H) 07/31/2024   PLT 204 07/31/2024    Latest Reference Range & Units 07/10/24 12:12  TSH 0.350 - 4.500 uIU/mL 7.320 (H)  (H): Data is abnormally high   RADIOGRAPHIC STUDIES: I have personally reviewed the radiological images as listed and agreed with the findings in the report.   "

## 2024-07-31 ENCOUNTER — Encounter: Payer: Self-pay | Admitting: Oncology

## 2024-07-31 ENCOUNTER — Inpatient Hospital Stay: Admitting: Licensed Clinical Social Worker

## 2024-07-31 ENCOUNTER — Inpatient Hospital Stay: Payer: Self-pay | Admitting: Oncology

## 2024-07-31 ENCOUNTER — Inpatient Hospital Stay: Payer: Self-pay | Attending: Hematology

## 2024-07-31 ENCOUNTER — Encounter: Payer: Self-pay | Admitting: Internal Medicine

## 2024-07-31 ENCOUNTER — Inpatient Hospital Stay: Payer: Self-pay

## 2024-07-31 VITALS — BP 98/72 | HR 70 | Resp 18

## 2024-07-31 DIAGNOSIS — C7801 Secondary malignant neoplasm of right lung: Secondary | ICD-10-CM

## 2024-07-31 DIAGNOSIS — E032 Hypothyroidism due to medicaments and other exogenous substances: Secondary | ICD-10-CM | POA: Diagnosis not present

## 2024-07-31 DIAGNOSIS — C3411 Malignant neoplasm of upper lobe, right bronchus or lung: Secondary | ICD-10-CM | POA: Diagnosis present

## 2024-07-31 DIAGNOSIS — Z79620 Long term (current) use of immunosuppressive biologic: Secondary | ICD-10-CM | POA: Diagnosis not present

## 2024-07-31 DIAGNOSIS — C7931 Secondary malignant neoplasm of brain: Secondary | ICD-10-CM | POA: Diagnosis not present

## 2024-07-31 DIAGNOSIS — R21 Rash and other nonspecific skin eruption: Secondary | ICD-10-CM | POA: Diagnosis not present

## 2024-07-31 DIAGNOSIS — F17210 Nicotine dependence, cigarettes, uncomplicated: Secondary | ICD-10-CM | POA: Insufficient documentation

## 2024-07-31 DIAGNOSIS — Z5111 Encounter for antineoplastic chemotherapy: Secondary | ICD-10-CM | POA: Diagnosis present

## 2024-07-31 DIAGNOSIS — C787 Secondary malignant neoplasm of liver and intrahepatic bile duct: Secondary | ICD-10-CM | POA: Insufficient documentation

## 2024-07-31 DIAGNOSIS — Z5112 Encounter for antineoplastic immunotherapy: Secondary | ICD-10-CM | POA: Diagnosis present

## 2024-07-31 LAB — CBC WITH DIFFERENTIAL/PLATELET
Abs Immature Granulocytes: 0.01 K/uL (ref 0.00–0.07)
Basophils Absolute: 0.1 K/uL (ref 0.0–0.1)
Basophils Relative: 2 %
Eosinophils Absolute: 0.2 K/uL (ref 0.0–0.5)
Eosinophils Relative: 7 %
HCT: 37.9 % (ref 36.0–46.0)
Hemoglobin: 13.2 g/dL (ref 12.0–15.0)
Immature Granulocytes: 0 %
Lymphocytes Relative: 19 %
Lymphs Abs: 0.6 K/uL — ABNORMAL LOW (ref 0.7–4.0)
MCH: 35.1 pg — ABNORMAL HIGH (ref 26.0–34.0)
MCHC: 34.8 g/dL (ref 30.0–36.0)
MCV: 100.8 fL — ABNORMAL HIGH (ref 80.0–100.0)
Monocytes Absolute: 0.4 K/uL (ref 0.1–1.0)
Monocytes Relative: 14 %
Neutro Abs: 1.8 K/uL (ref 1.7–7.7)
Neutrophils Relative %: 58 %
Platelets: 204 K/uL (ref 150–400)
RBC: 3.76 MIL/uL — ABNORMAL LOW (ref 3.87–5.11)
RDW: 12.4 % (ref 11.5–15.5)
WBC: 3.1 K/uL — ABNORMAL LOW (ref 4.0–10.5)
nRBC: 0 % (ref 0.0–0.2)

## 2024-07-31 LAB — COMPREHENSIVE METABOLIC PANEL WITH GFR
ALT: 50 U/L — ABNORMAL HIGH (ref 0–44)
AST: 30 U/L (ref 15–41)
Albumin: 4.6 g/dL (ref 3.5–5.0)
Alkaline Phosphatase: 58 U/L (ref 38–126)
Anion gap: 14 (ref 5–15)
BUN: 8 mg/dL (ref 6–20)
CO2: 22 mmol/L (ref 22–32)
Calcium: 9.7 mg/dL (ref 8.9–10.3)
Chloride: 105 mmol/L (ref 98–111)
Creatinine, Ser: 0.57 mg/dL (ref 0.44–1.00)
GFR, Estimated: >60 mL/min
Glucose, Bld: 85 mg/dL (ref 70–99)
Potassium: 3.9 mmol/L (ref 3.5–5.1)
Sodium: 141 mmol/L (ref 135–145)
Total Bilirubin: 0.3 mg/dL (ref 0.0–1.2)
Total Protein: 7 g/dL (ref 6.5–8.1)

## 2024-07-31 LAB — MAGNESIUM: Magnesium: 2.4 mg/dL (ref 1.7–2.4)

## 2024-07-31 MED ORDER — ONDANSETRON HCL 4 MG/2ML IJ SOLN
8.0000 mg | Freq: Once | INTRAMUSCULAR | Status: AC
  Administered 2024-07-31: 8 mg via INTRAVENOUS
  Filled 2024-07-31: qty 4

## 2024-07-31 MED ORDER — SODIUM CHLORIDE 0.9 % IV SOLN: INTRAVENOUS | Status: DC

## 2024-07-31 MED ORDER — SODIUM CHLORIDE 0.9 % IV SOLN
500.0000 mg/m2 | Freq: Once | INTRAVENOUS | Status: AC
  Administered 2024-07-31: 900 mg via INTRAVENOUS
  Filled 2024-07-31: qty 20

## 2024-07-31 MED ORDER — SODIUM CHLORIDE 0.9 % IV SOLN
200.0000 mg | Freq: Once | INTRAVENOUS | Status: AC
  Administered 2024-07-31: 200 mg via INTRAVENOUS
  Filled 2024-07-31: qty 8

## 2024-07-31 NOTE — Progress Notes (Signed)
 Patient tolerated chemotherapy with no complaints voiced.  Side effects with management reviewed with understanding verbalized.  Port site clean and dry with no bruising or swelling noted at site.  Good blood return noted before and after administration of chemotherapy.  Band aid applied.  Patient left in satisfactory condition with VSS and no s/s of distress noted. All follow ups as scheduled.   Venkat Ankney [employer]

## 2024-07-31 NOTE — Progress Notes (Signed)
 Patient has been examined by Dr. Davonna. Vital signs and labs have been reviewed by MD - ANC, Creatinine, LFTs, hemoglobin, and platelets have been reviewed by M.D. - pt may proceed with treatment.  Primary RN and pharmacy notified.

## 2024-07-31 NOTE — Patient Instructions (Signed)
 [facility] [place] - A DEPT OF [facility]  Discharge Instructions: Thank you for choosing [facility] to provide your oncology and hematology care.  If you have a lab appointment with the Cancer Center - please note that after April 8th, 2024, all labs will be drawn in the cancer center.  You do not have to check in or register with the main entrance as you have in the past but will complete your check-in in the cancer center.  Wear comfortable clothing and clothing appropriate for easy access to any Portacath or PICC line.   We strive to give you quality time with your provider. You may need to reschedule your appointment if you arrive late (15 or more minutes).  Arriving late affects you and other patients whose appointments are after yours.  Also, if you miss three or more appointments without notifying the office, you may be dismissed from the clinic at the providers discretion.      For prescription refill requests, have your pharmacy contact our office and allow 72 hours for refills to be completed.    Today you received the following chemotherapy and/or immunotherapy agents Keytruda  and Alimta    To help prevent nausea and vomiting after your treatment, we encourage you to take your nausea medication as directed.  BELOW ARE SYMPTOMS THAT SHOULD BE REPORTED IMMEDIATELY: *FEVER GREATER THAN 100.4 F (38 C) OR HIGHER *CHILLS OR SWEATING *NAUSEA AND VOMITING THAT IS NOT CONTROLLED WITH YOUR NAUSEA MEDICATION *UNUSUAL SHORTNESS OF BREATH *UNUSUAL BRUISING OR BLEEDING *URINARY PROBLEMS (pain or burning when urinating, or frequent urination) *BOWEL PROBLEMS (unusual diarrhea, constipation, pain near the anus) TENDERNESS IN MOUTH AND THROAT WITH OR WITHOUT PRESENCE OF ULCERS (sore throat, sores in mouth, or a toothache) UNUSUAL RASH, SWELLING OR PAIN  UNUSUAL VAGINAL DISCHARGE OR ITCHING   Items with * indicate a potential emergency and should be  followed up as soon as possible or go to the Emergency Department if any problems should occur.  Please show the CHEMOTHERAPY ALERT CARD or IMMUNOTHERAPY ALERT CARD at check-in to the Emergency Department and triage nurse.  Should you have questions after your visit or need to cancel or reschedule your appointment, please contact [facility] [place] - A DEPT OF [facility] [phone]  and follow the prompts.  Office hours are 8:00 a.m. to 4:30 p.m. Monday - Friday. Please note that voicemails left after 4:00 p.m. may not be returned until the following business day.  We are closed weekends and major holidays. You have access to a nurse at all times for urgent questions. Please call the main number to the clinic [phone] and follow the prompts.  For any non-urgent questions, you may also contact your provider using MyChart. We now offer e-Visits for anyone 68 and older to request care online for non-urgent symptoms. For details visit [email or web address].   Also download the MyChart app! Go to the app store, search MyChart, open the app, select [place], and log in with your MyChart username and password.

## 2024-07-31 NOTE — Progress Notes (Signed)
 Patients port flushed without difficulty.  Good blood return noted with no bruising or swelling noted at site. Patient remains accessed for treatment.

## 2024-07-31 NOTE — Patient Instructions (Signed)

## 2024-07-31 NOTE — Progress Notes (Signed)
 [facility] CSW Progress Note  Visual Merchandiser met with patient to follow-up on letter requested for work benefits.    Interventions: Pt requesting a copy of the letter sent to her employer on her behalf to access work benefits which was provided.  Pt reports she has spoken w/ the [facility] and will begin the process of applying for SSDI.  CSW provided space for pt to reflect on current circumstances.  Emotional support provided.        Follow Up Plan:  Patient will contact CSW with any support or resource needs    Devere JONELLE Manna, LCSW Clinical Social Worker [facility]

## 2024-08-02 ENCOUNTER — Other Ambulatory Visit: Payer: Self-pay

## 2024-08-02 DIAGNOSIS — Z5112 Encounter for antineoplastic immunotherapy: Secondary | ICD-10-CM | POA: Diagnosis not present

## 2024-08-04 ENCOUNTER — Encounter: Payer: Self-pay | Admitting: Oncology

## 2024-08-12 ENCOUNTER — Encounter: Payer: Self-pay | Admitting: Diagnostic Neuroimaging

## 2024-08-12 ENCOUNTER — Telehealth (INDEPENDENT_AMBULATORY_CARE_PROVIDER_SITE_OTHER): Admitting: Diagnostic Neuroimaging

## 2024-08-12 DIAGNOSIS — C7801 Secondary malignant neoplasm of right lung: Secondary | ICD-10-CM

## 2024-08-12 DIAGNOSIS — C7931 Secondary malignant neoplasm of brain: Secondary | ICD-10-CM

## 2024-08-12 DIAGNOSIS — Z8669 Personal history of other diseases of the nervous system and sense organs: Secondary | ICD-10-CM

## 2024-08-12 MED ORDER — RIZATRIPTAN BENZOATE 10 MG PO TBDP: 10.0000 mg | ORAL_TABLET | ORAL | 11 refills | Status: AC | PRN

## 2024-08-12 NOTE — Progress Notes (Signed)
 "  [facility]  PATIENT: Julie Horn DOB: 12-Jan-1976  REFERRING CLINICIAN: Zollie Lowers, MD HISTORY FROM: patient  REASON FOR VISIT: follow up    HISTORICAL  CHIEF COMPLAINT:  No chief complaint on file.   HISTORY OF PRESENT ILLNESS:   UPDATE (08/12/24, VRP): Since last visit, doing well. HA are improving. Tried TPX (caused some side effects). Using rizatriptan  here and there. Now ~5-6 HA per month.   PRIOR HPI (04/08/24, VRP): 49 year old female here for evaluation of headaches.  Patient had onset of headaches starting in February 2025.  Went to PCP and had MRI of the brain ordered.  She was found to have a cerebellar mass and went to the hospital for further evaluation.  CT of the chest abdomen pelvis obtained demonstrating a right upper lung spiculated mass.  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  She underwent treatment with chemotherapy and radiation therapy.  Since that time has continued to have persistent headaches which are mild to moderate on a daily basis and severe twice a week.  Headaches are mainly bicipital region.  They tend to wake her up from sleep.  These are associated with nausea and pressure sensation.  She has used Fioricet and tramadol  with some relief.  History of migraine headaches in childhood, previously treated around 2008 with topiramate , Zomig and Maxalt .  History of nausea vomiting, sensitive to light sound with prior headaches.   REVIEW OF SYSTEMS: Full 14 system review of systems performed and negative with exception of: as per HPI.  ALLERGIES: Allergies  Allergen Reactions   Levofloxacin Other (See Comments)    Red skin, felt like skin was on fire, sensitive to touch   Sulfa Antibiotics Other (See Comments)    Severe stomach pain   Azithromycin Rash   Doxycycline  Rash   Latex Rash   Wound Dressing Adhesive Rash    Tape     HOME  MEDICATIONS: Outpatient Medications Prior to Visit  Medication Sig Dispense Refill   acetaminophen  (TYLENOL ) 500 MG tablet Take 1,000 mg by mouth every 6 (six) hours as needed for mild pain (pain score 1-3). Take 2 tablets (1,000 mg total) by mouth every six (6) hours.     Albuterol -Budesonide  (AIRSUPRA ) 90-80 MCG/ACT AERO Inhale 2 puffs into the lungs every 4 (four) hours as needed. 10.7 g 11   ascorbic acid (VITAMIN C) 500 MG tablet Take 500 mg by mouth daily. Take 1 tablet (500 mg total) by mouth daily     butalbital -acetaminophen -caffeine  (FIORICET) 50-325-40 MG tablet Take 1 tablet by mouth every 4 (four) hours as needed for headache. 14 tablet 1   cetirizine  (ZYRTEC ) 10 MG chewable tablet Chew 10 mg by mouth.     co-enzyme Q-10 30 MG capsule Take 30 mg by mouth 3 (three) times daily. Take 1 capsule (30 mg total) by mouth Three (3) times a day.     Cyanocobalamin  (VITAMIN B 12) 500 MCG TABS Take 500 mg by mouth daily.     folic acid  (FOLVITE ) 1 MG tablet Take 1 tablet (1 mg total) by mouth daily. 30 tablet 6   levothyroxine  (SYNTHROID ) 75 MCG tablet Take 1 tablet (75 mcg total) by mouth daily before breakfast. 60 tablet 2   magnesium oxide (MAG-OX) 400 (240 Mg) MG tablet Take 400 mg by mouth daily. Take 1 tablet (400 mg total) by mouth daily.     melatonin 1 MG TABS tablet Take 1 mg by mouth  at bedtime. Take 5 tablets (5 mg total) by mouth nightly.     ondansetron  (ZOFRAN -ODT) 4 MG disintegrating tablet Take 4 mg by mouth every 8 (eight) hours as needed.     Pembrolizumab  (KEYTRUDA  IV) Inject 1 Units into the vein every 21 ( twenty-one) days.     PEMEtrexed  Disodium (ALIMTA  IV) Inject 1 Units into the vein every 21 ( twenty-one) days.     sennosides-docusate sodium (SENOKOT-S) 8.6-50 MG tablet Take 2 tablets by mouth daily.     traMADol  (ULTRAM ) 50 MG tablet Take 1 tablet (50 mg total) by mouth every 12 (twelve) hours as needed for severe pain (pain score 7-10) (headaches). 30 tablet 0    traZODone  (DESYREL ) 150 MG tablet USE FROM 1/3 TO 1 TABLET NIGHTLY AS NEEDED FOR SLEEP. (Patient taking differently: Use from 1/6 tablet nightly as needed for sleep.) 90 tablet 1   triamcinolone  cream (KENALOG ) 0.1 % Apply 1 Application topically 2 (two) times daily. 60 g 2   No facility-administered medications prior to visit.    PAST MEDICAL HISTORY: Past Medical History:  Diagnosis Date   Abnormal Pap smear of cervix 04/17/2018   Asthma    GERD (gastroesophageal reflux disease)    History of migraine 04/17/2018   Had been managed on Topamax  but had side effect. Now not active.   Raynaud's disease    Spondylolisthesis of lumbar region 04/17/2018    PAST SURGICAL HISTORY: Past Surgical History:  Procedure Laterality Date   Craniectomy  10/11/2023   tumor removal   IR IMAGING GUIDED PORT INSERTION  11/03/2023   LAPAROSCOPY     TONSILLECTOMY AND ADENOIDECTOMY  1983    FAMILY HISTORY: Family History  Problem Relation Age of Onset   Breast cancer Mother    Asthma Mother    Polymyalgia rheumatica Mother    Ehlers-Danlos syndrome Mother    Cancer Maternal Grandmother    Heart attack Maternal Grandfather    Asthma Brother    Arthritis Brother     SOCIAL HISTORY: Social History   Socioeconomic History   Marital status: Divorced    Spouse name: Not on file   Number of children: 1   Years of education: Not on file   Highest education level: Bachelor's degree (e.g., BA, AB, BS)  Occupational History   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    Smoked 1 PPD at her heaviest.     Quit smoking in 06/2023- khj 10/03/2023  Vaping Use   Vaping  status: Former  Substance and Sexual Activity   Alcohol use: Not Currently    Comment: occasionally    Drug use: Not Currently   Sexual activity: Yes    Birth control/protection: None    Comment: [id number] ,? infertility  Other Topics Concern   Not on file  Social History Narrative   Patient getting married 04/12/24   Social Drivers of Health   Tobacco Use: Medium Risk (04/08/2024)   Patient History    Smoking Tobacco Use: Former    Smokeless Tobacco Use: Never    Passive Exposure: Not on file  Financial Resource Strain: Patient Declined (10/17/2023)   Overall Financial Resource Strain (CARDIA)    Difficulty of Paying Living Expenses: Patient declined  Food Insecurity: No Food Insecurity (11/02/2023)   Received [facility]   Epic    Within the past 12 months, you worried that your food would run out before you got the money to buy more.: Never true    Within the past 12 months, the food you bought just didn't last and you didn't have money to get more.: Never true  Transportation Needs: No Transportation Needs (11/02/2023)   Received from [facility] - Transportation    Lack of Transportation (Medical): No    Lack of Transportation (Non-Medical): No  Physical Activity: Insufficiently Active (10/17/2023)   Exercise Vital Sign    Days of Exercise per Week: 3 days    Minutes of Exercise per Session: 20 min  Stress: No Stress Concern Present (10/17/2023)   [employer] - Occupational Stress Questionnaire    Feeling of Stress : Not at all  Social Connections: Unknown (10/17/2023)   Social Connection and Isolation Panel    Frequency of Communication with Friends and Family: More than three times a week    Frequency of Social Gatherings with Friends and Family: More than three times a week    Attends Religious Services: Patient declined    Database Administrator or Organizations: Patient declined    Attends Banker Meetings:  Not on file    Marital Status: Living with partner  Intimate Partner Violence: Not At Risk (10/13/2023)   Humiliation, Afraid, Rape, and Kick questionnaire    Fear of Current or Ex-Partner: No    Emotionally Abused: No    Physically Abused: No    Sexually Abused: No  Depression (PHQ2-9): Low Risk (07/31/2024)   Depression (PHQ2-9)    PHQ-2 Score: 0  Alcohol Screen: Low Risk (10/17/2023)   Alcohol Screen    Last Alcohol Screening Score (AUDIT): 1  Housing: Unknown (10/17/2023)   Housing Stability Vital Sign    Unable to Pay for Housing in the Last Year: Patient declined    Number of Times Moved in the Last Year: 0    Homeless in the Last Year: No  Utilities: Low Risk (11/02/2023)   [facility]   Utilities    Within the past 12 months, have you been unable to get utilities(heat, electricity) when it was really needed?: No  Health Literacy: Not on file     PHYSICAL EXAM  GENERAL EXAM/CONSTITUTIONAL: Vitals:  There were no vitals filed for this visit.  There is no height or weight on file to calculate BMI. Wt Readings from Last 3 Encounters:  07/31/24 142 lb 3.2 oz (64.5 kg)  07/10/24 141 lb 1.5 oz (64 kg)  06/19/24 141 lb 5 oz (64.1 kg)   Patient is in no distress; well developed, nourished and groomed; neck is supple  CARDIOVASCULAR: Examination of carotid arteries is normal; no carotid bruits Regular rate and rhythm, no murmurs Examination of peripheral vascular system by observation and palpation is normal  EYES: Ophthalmoscopic exam of optic discs and posterior segments is normal; no papilledema or hemorrhages No results found.  MUSCULOSKELETAL: Gait, strength, tone, movements noted in Neurologic exam below  NEUROLOGIC: MENTAL STATUS:      No data to display         awake, alert, oriented to person, place and time recent and  remote memory intact normal attention and concentration language fluent, comprehension intact, naming intact fund of  knowledge appropriate  CRANIAL NERVE:  2nd - no papilledema on fundoscopic exam 2nd, 3rd, 4th, 6th - pupils equal and reactive to light, visual fields full to confrontation, extraocular muscles intact, no nystagmus 5th - facial sensation symmetric 7th - facial strength symmetric 8th - hearing intact 9th - palate elevates symmetrically, uvula midline 11th - shoulder shrug symmetric 12th - tongue protrusion midline  MOTOR:  normal bulk and tone, full strength in the BUE, BLE  SENSORY:  normal and symmetric to light touch, temperature, vibration  COORDINATION:  finger-nose-finger, fine finger movements normal  REFLEXES:  deep tendon reflexes present and symmetric  GAIT/STATION:  narrow based gait     DIAGNOSTIC DATA (LABS, IMAGING, TESTING) - I reviewed patient records, labs, notes, testing and imaging myself where available.  Lab Results  Component Value Date   WBC 3.1 (L) 07/31/2024   HGB 13.2 07/31/2024   HCT 37.9 07/31/2024   MCV 100.8 (H) 07/31/2024   PLT 204 07/31/2024      Component Value Date/Time   NA 141 07/31/2024 0935   NA 145 (H) 05/22/2023 1054   K 3.9 07/31/2024 0935   CL 105 07/31/2024 0935   CO2 22 07/31/2024 0935   GLUCOSE 85 07/31/2024 0935   BUN 8 07/31/2024 0935   BUN 7 05/22/2023 1054   CREATININE 0.57 07/31/2024 0935   CALCIUM 9.7 07/31/2024 0935   PROT 7.0 07/31/2024 0935   PROT 7.4 05/22/2023 1054   ALBUMIN 4.6 07/31/2024 0935   ALBUMIN 4.8 05/22/2023 1054   AST 30 07/31/2024 0935   ALT 50 (H) 07/31/2024 0935   ALKPHOS 58 07/31/2024 0935   BILITOT 0.3 07/31/2024 0935   BILITOT 0.5 05/22/2023 1054   GFRNONAA >60 07/31/2024 0935   Lab Results  Component Value Date   CHOL 212 (H) 05/22/2023   HDL 89 05/22/2023   LDLCALC 107 (H) 05/22/2023   TRIG 93 05/22/2023   CHOLHDL 2.4 05/22/2023   No results found for: HGBA1C No results found for: VITAMINB12 Lab Results  Component Value Date   TSH 7.320 (H) 07/10/2024    09/25/23  MRI brain [I reviewed images myself and agree with interpretation. -VRP]  1. Heterogeneously contrast-enhancing mass of the right cerebellar hemisphere with a large amount of surrounding vasogenic edema. Metastasis and hemangioblastoma are the most common infratentorial tumors in adults. Astrocytoma is another possibility. 2. Mass effect on the fourth ventricle without hydrocephalus.  10/11/2023 Final  A: Brain tumor, biopsy - Metastatic poorly differentiated adenocarcinoma consistent with lung primary.  B: Brain tumor, right, resection - Metastatic poorly differentiated adenocarcinoma consistent with lung primary.  C: Brain tumor, right, specimen trap - Metastatic poorly differentiated adenocarcinoma consistent with lung primary.  10/26/23 MRI brain (with and without) [I reviewed images myself and agree with interpretation. -VRP]  Status post right occipital craniotomy for mass resection. Marked reduction in edema and mass effect, with a now normal appearance of the fourth ventricle. Blood products are present in the region of the approach and resection, with a small amount of adjacent enhancement as would be expected at this time. The study is indeterminate for residual marginal tumor and additional follow-up is warranted.  No new or second lesion identified.  02/05/24 MRI brain  --Postsurgical changes of right cerebellar mass resection with further collapse of the resection cavity. Decreasing peripheral enhancement of the resection cavity is favored postsurgical.  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  -- Questioned enhancement in the right greater than left internal auditory canals, potentially additional sites of leptomeningeal disease.   06/26/24 MRI brain  -Compared to 04/24/2024:  -New 5 mm enhancing focus in the right parafalcine frontal lobe with  surrounding vasogenic edema. Slightly increased size of a few of the previously characterized nodular leptomeningeal enhancing foci as detailed in the body 2 of which in the high frontal convexities demonstrate increased surrounding vasogenic edema.  -Stable postsurgical changes from prior right suboccipital craniotomy for right cerebellar mass resection. No evidence of local recurrence.  -Unchanged linear enhancement in the right internal auditory canal could reflect vascular structure versus leptomeningeal enhancement.     ASSESSMENT AND PLAN  49 y.o. year old female here with:  Meds tried: topiramate  (tired), rizatriptan  (slightly helping)  Dx:  1. Metastasis to brain (HCC)   2. History of migraine   3. Metastatic lung carcinoma, right (HCC)      PLAN:  POSITIONAL HEADACHE / POST-TREATMENT HEADACHE (metastatic lung cancer to the brain, status post biopsy, chemotherapy, radiation therapy; history of migraine with aura) - improving; monitor; continue oncology treatments  HEADACHE / MIGRAINE PREVENTION  LIFESTYLE CHANGES -Decrease or avoid caffeine  / alcohol -Eat and sleep on a regular schedule -Exercise several times per week  HEADACHE / MIGRAINE RESCUE  - fioricet, tramadol  as needed - rizatriptan  (Maxalt ) 10mg  as needed for breakthrough headache; may repeat x 1 after 2 hours; max 2 tabs per day or 8 per month   Meds ordered this encounter  Medications   rizatriptan  (MAXALT -MLT) 10 MG disintegrating tablet    Sig: Take 1 tablet (10 mg total) by mouth as needed for migraine. May repeat in 2 hours if needed    Dispense:  9 tablet    Refill:  11   Return for pending if symptoms worsen or fail to improve.  Virtual Visit via Video Note  I connected with Dawnell Bryant on 08/12/2024 at  1:45 PM EST by a video enabled telemedicine application and verified that I am speaking with the correct person using two identifiers.   I discussed the limitations of  evaluation and management by telemedicine and the availability of in person appointments. The patient expressed understanding and agreed to proceed.  Patient is at home and I am at the office.   I spent 15 minutes of face-to-face and non-face-to-face time with patient.  This included previsit chart review, lab review, study review, order entry, electronic health record documentation, patient education.     EDUARD FABIENE HANLON, MD 08/12/2024, 2:07 PM Certified in Neurology, Neurophysiology and Neuroimaging  [facility] [address] [place], [place] [address] [phone]  "

## 2024-08-13 ENCOUNTER — Ambulatory Visit (HOSPITAL_COMMUNITY)
Admission: RE | Admit: 2024-08-13 | Discharge: 2024-08-13 | Disposition: A | Discharge status: Home / Self Care | Source: Ambulatory Visit | Attending: Oncology | Admitting: Oncology

## 2024-08-13 DIAGNOSIS — C7801 Secondary malignant neoplasm of right lung: Secondary | ICD-10-CM | POA: Diagnosis present

## 2024-08-13 MED ORDER — HEPARIN SOD (PORK) LOCK FLUSH 100 UNIT/ML IV SOLN
INTRAVENOUS | Status: AC
  Filled 2024-08-13: qty 5

## 2024-08-13 MED ORDER — IOHEXOL 300 MG/ML  SOLN
100.0000 mL | Freq: Once | INTRAMUSCULAR | Status: AC | PRN
  Administered 2024-08-13: 100 mL via INTRAVENOUS

## 2024-08-13 MED ORDER — HEPARIN SOD (PORK) LOCK FLUSH 100 UNIT/ML IV SOLN
500.0000 [IU] | Freq: Once | INTRAVENOUS | Status: AC
  Administered 2024-08-13: 500 [IU] via INTRAVENOUS

## 2024-08-20 ENCOUNTER — Other Ambulatory Visit: Payer: Self-pay

## 2024-08-20 ENCOUNTER — Encounter: Payer: Self-pay | Admitting: Oncology

## 2024-08-21 ENCOUNTER — Inpatient Hospital Stay: Payer: Self-pay

## 2024-08-21 ENCOUNTER — Inpatient Hospital Stay: Payer: Self-pay | Admitting: Oncology

## 2024-08-22 ENCOUNTER — Inpatient Hospital Stay

## 2024-08-22 ENCOUNTER — Inpatient Hospital Stay: Admitting: Oncology

## 2024-08-22 VITALS — BP 104/77 | HR 71 | Temp 96.8°F | Resp 18

## 2024-08-22 DIAGNOSIS — Z5112 Encounter for antineoplastic immunotherapy: Secondary | ICD-10-CM | POA: Diagnosis not present

## 2024-08-22 DIAGNOSIS — R635 Abnormal weight gain: Secondary | ICD-10-CM | POA: Diagnosis not present

## 2024-08-22 DIAGNOSIS — D72810 Lymphocytopenia: Secondary | ICD-10-CM | POA: Diagnosis not present

## 2024-08-22 DIAGNOSIS — C7801 Secondary malignant neoplasm of right lung: Secondary | ICD-10-CM | POA: Diagnosis not present

## 2024-08-22 DIAGNOSIS — Z789 Other specified health status: Secondary | ICD-10-CM

## 2024-08-22 DIAGNOSIS — K117 Disturbances of salivary secretion: Secondary | ICD-10-CM

## 2024-08-22 DIAGNOSIS — C7931 Secondary malignant neoplasm of brain: Secondary | ICD-10-CM

## 2024-08-22 DIAGNOSIS — E032 Hypothyroidism due to medicaments and other exogenous substances: Secondary | ICD-10-CM | POA: Diagnosis not present

## 2024-08-22 DIAGNOSIS — C787 Secondary malignant neoplasm of liver and intrahepatic bile duct: Secondary | ICD-10-CM

## 2024-08-22 DIAGNOSIS — Z79899 Other long term (current) drug therapy: Secondary | ICD-10-CM | POA: Diagnosis not present

## 2024-08-22 DIAGNOSIS — R21 Rash and other nonspecific skin eruption: Secondary | ICD-10-CM | POA: Diagnosis not present

## 2024-08-22 LAB — CBC WITH DIFFERENTIAL/PLATELET
Abs Immature Granulocytes: 0.01 10*3/uL (ref 0.00–0.07)
Basophils Absolute: 0 10*3/uL (ref 0.0–0.1)
Basophils Relative: 1 %
Eosinophils Absolute: 0.2 10*3/uL (ref 0.0–0.5)
Eosinophils Relative: 9 %
HCT: 37.7 % (ref 36.0–46.0)
Hemoglobin: 12.8 g/dL (ref 12.0–15.0)
Immature Granulocytes: 0 %
Lymphocytes Relative: 24 %
Lymphs Abs: 0.6 10*3/uL — ABNORMAL LOW (ref 0.7–4.0)
MCH: 34.4 pg — ABNORMAL HIGH (ref 26.0–34.0)
MCHC: 34 g/dL (ref 30.0–36.0)
MCV: 101.3 fL — ABNORMAL HIGH (ref 80.0–100.0)
Monocytes Absolute: 0.4 10*3/uL (ref 0.1–1.0)
Monocytes Relative: 16 %
Neutro Abs: 1.2 10*3/uL — ABNORMAL LOW (ref 1.7–7.7)
Neutrophils Relative %: 50 %
Platelets: 173 10*3/uL (ref 150–400)
RBC: 3.72 MIL/uL — ABNORMAL LOW (ref 3.87–5.11)
RDW: 12 % (ref 11.5–15.5)
WBC: 2.5 10*3/uL — ABNORMAL LOW (ref 4.0–10.5)
nRBC: 0 % (ref 0.0–0.2)

## 2024-08-22 LAB — COMPREHENSIVE METABOLIC PANEL WITH GFR
ALT: 26 U/L (ref 0–44)
AST: 22 U/L (ref 15–41)
Albumin: 4.6 g/dL (ref 3.5–5.0)
Alkaline Phosphatase: 55 U/L (ref 38–126)
Anion gap: 12 (ref 5–15)
BUN: 8 mg/dL (ref 6–20)
CO2: 23 mmol/L (ref 22–32)
Calcium: 9.6 mg/dL (ref 8.9–10.3)
Chloride: 106 mmol/L (ref 98–111)
Creatinine, Ser: 0.54 mg/dL (ref 0.44–1.00)
GFR, Estimated: >60 mL/min
Glucose, Bld: 80 mg/dL (ref 70–99)
Potassium: 4 mmol/L (ref 3.5–5.1)
Sodium: 141 mmol/L (ref 135–145)
Total Bilirubin: 0.3 mg/dL (ref 0.0–1.2)
Total Protein: 6.9 g/dL (ref 6.5–8.1)

## 2024-08-22 LAB — TSH: TSH: 12.8 u[IU]/mL — ABNORMAL HIGH (ref 0.350–4.500)

## 2024-08-22 LAB — MAGNESIUM: Magnesium: 2.1 mg/dL (ref 1.7–2.4)

## 2024-08-22 MED ORDER — ONDANSETRON HCL 4 MG/2ML IJ SOLN
8.0000 mg | Freq: Once | INTRAMUSCULAR | Status: AC
  Administered 2024-08-22: 8 mg via INTRAVENOUS
  Filled 2024-08-22: qty 4

## 2024-08-22 MED ORDER — SODIUM CHLORIDE 0.9 % IV SOLN
500.0000 mg/m2 | Freq: Once | INTRAVENOUS | Status: AC
  Administered 2024-08-22: 900 mg via INTRAVENOUS
  Filled 2024-08-22: qty 20

## 2024-08-22 MED ORDER — SODIUM CHLORIDE 0.9 % IV SOLN: INTRAVENOUS | Status: DC

## 2024-08-22 MED ORDER — CYANOCOBALAMIN 1000 MCG/ML IJ SOLN
1000.0000 ug | Freq: Once | INTRAMUSCULAR | Status: AC
  Administered 2024-08-22: 1000 ug via INTRAMUSCULAR
  Filled 2024-08-22: qty 1

## 2024-08-22 MED ORDER — ESTROGENS CONJUGATED 0.625 MG/GM VA CREA: 1.0000 | TOPICAL_CREAM | Freq: Every day | VAGINAL | 12 refills | Status: AC

## 2024-08-22 MED ORDER — LEVOTHYROXINE SODIUM 100 MCG PO TABS: 100.0000 ug | ORAL_TABLET | Freq: Every day | ORAL | 2 refills | Status: AC

## 2024-08-22 MED ORDER — SODIUM CHLORIDE 0.9 % IV SOLN
200.0000 mg | Freq: Once | INTRAVENOUS | Status: AC
  Administered 2024-08-22: 200 mg via INTRAVENOUS
  Filled 2024-08-22: qty 8

## 2024-08-22 NOTE — Progress Notes (Signed)
 Patient tolerated chemotherapy with no complaints voiced.  Side effects with management reviewed with understanding verbalized.  Port site clean and dry with no bruising or swelling noted at site.  Good blood return noted before and after administration of chemotherapy.  Band aid applied.  Patient left in satisfactory condition with VSS and no s/s of distress noted. All follow ups as scheduled.   Venkat Ankney [employer]

## 2024-08-22 NOTE — Progress Notes (Signed)
 " Patient Care Team: Zollie Lowers, MD as PCP - General (Family Medicine) Dolphus Reiter, MD as Consulting Physician (Rheumatology) Davonna Siad, MD as Medical Oncologist (Medical Oncology) Celestia Joesph SQUIBB, RN as Oncology Nurse Navigator (Medical Oncology) Tamea Dedra CROME, MD as Consulting Physician (Pulmonary Disease) Darlean Ozell NOVAK, MD as Consulting Physician (Pulmonary Disease)  Clinic Day:  08/01/2023  Referring physician: Zollie Lowers, MD   CHIEF COMPLAINT:  CC: Metastatic lung adenocarcinoma    ASSESSMENT & PLAN:   Assessment & Plan: Julie Horn  is a 49 y.o. 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   S/p cerebellar lesion resection consistent with lung primary.   Oncology history as below. PET scan showed limited disease to lung and lymph nodes.  Discussed at tumor board, surgery is not a feasible option secondary to lymph node involvement.  Recommended chemoradiation. Caris: PD-L1: 100% Completed chemoRT on 01/08/2024 with carboplatin  and paclitaxel  Recent MRI brain with new sites of metastasis and CT scan within evidence of new liver lesion with response locally in the lung. MRI liver with confirmation of liver metastasis. Discussion at the thoracic tumor board, and started on chemoimmunotherapy on 02/28/2024: Started on carboplatin , pemetrexed  and pembrolizumab .  Currently on maintenance pemetrexed  and pembrolizumab    -Cycle 9-day 1 today.  Patient tolerated previous cycle well with no significant side effects -We reviewed the CT scan findings together. Stable findings.  - Labs reviewed today:CMP: WNL,  CBC: WBC: 2.5, ANC: 1200, hemoglobin: 12.8, platelets: 173 -Physical exam stable. Will proceed with treatment today.  -Can consider dose  reducing pemetrexed  with next cycle or just continuing pembrolizumab  alone if WBC continues to trend down. - Educated patient on reaching out if she has rash, shortness of breath, abdominal pain, diarrhea that persists - Will repeat CT scan in 3 months.Due 10/2024.   Return to clinic in 6 weeks.  Metastasis to brain Cerebellar lesion s/p resection consistent with the poorly differentiated adenocarcinoma of lung primary. Patient completed gamma knife at [facility].  Currently on a clinical trial assessing gamma knife versus gamma tile Completed steroid taper. Repeat MRI brain with multiple new brain metastasis.  Completed SBRT at [facility] Recent MRI brain showed new brain mets at [facility]. Underwent further cyberknife resection.    - Continue to follow with [facility]. -Repeat MRI ordered in 3 months  Hypothyroidism Managed with levothyroxine . Patient reports weight gain recently TSH today elevated at 12.800  - Will increase levothyroxine  to 100mcg daily - Monitor for symptom improvement .  Pembrolizumab -induced skin rash Skin rash likely induced by pembrolizumab , located over bilateral thighs and stomach. Hydrocortisone had limited effect. Rash was itchy, especially at night. Resolved at this time  - Continue triamcinolone  0.1% cream, apply twice daily. - Consider Benadryl or cetirizine  for itch relief.  Headache, stable, managed with tramadol  and Fioricet Occasional severe headaches managed with tramadol . Neurologist consulted, headaches not frequent. - Continue tramadol  and Fioricet for headache management.  Chemotherapy-induced gastrointestinal toxicity Intermittent diarrhea and constipation persist despite discontinuation of magnesium supplementation. Symptoms are cyclical without severe episodes.  Significant xerostomia, xerophthalmia, nasal and vaginal dryness likely due to immunotherapy. Differential includes Sjogren's syndrome. Symptoms impact quality of life, including severe  dyspareunia.  - Ordered lab evaluation for Sjogren's syndrome. - Recommended biotin spray for oral dryness. - Advised increasing artificial tear use to 6-8  times daily. - Instructed to notify if ocular symptoms persist for consideration of steroid eye drops. - Recommended nasal saline spray for nasal dryness. - Prescribed low-dose (0.1%) estrogen cream for vaginal dryness. - Advised continued use of thick moisturizers and ceramide-containing creams for cutaneous dryness.  Raynaud's phenomenon Raynaud's phenomenon affecting hands and toes, triggered by cold exposure.  - Continue protective measures (gloves, cold avoidance). - Educated to report worsening or complications.   The patient understands the plans discussed today and is in agreement with them.  She knows to contact our office if she develops concerns prior to her next appointment.  30 minutes of total time was spent for this patient encounter, including preparation,face-to-face counseling with the patient and coordination of care, physical exam, and documentation of the encounter.    Mickiel Dry, MD  [facility] [place] - A DEPT OF [facility] [address] [place] [address] Dept: [phone] Dept Fax: [phone]   No orders of the defined types were placed in this encounter.    ONCOLOGY HISTORY:   Diagnosis: Right Lung metastatic adenocarcinoma  -09/25/2023: CT CAP: Spiculated mass in the posterior right pulmonary apex measuring 2.6 x 1.4 cm, consistent with primary bronchogenic malignancy. Enlarged right hilar lymph node measuring 1.5 x 1.2 cm, consistent with nodal metastatic disease. Tiny hypodensities in the central liver, incompletely characterized, although statistically likely benign cysts or hemangiomas. Small metastases not strictly excluded. Soft tissue nodule within the right gluteal subcutaneous fat; given location most likely an injection  granuloma, however subcutaneous soft tissue metastases are occasionally seen in metastatic lung malignancy. -09/25/2023: MRI Brain: Heterogeneously contrast-enhancing mass of the right cerebellar hemisphere with a large amount of surrounding vasogenic edema. Metastasis and hemangioblastoma are the most common infratentorial tumors in adults. Astrocytoma is another possibility. Mass effect on the fourth ventricle without hydrocephalus. -10/11/2023: Right cerebellar resection.  Pathology: Metastatic poorly differentiated adenocarcinoma consistent with lung primary. -10/05/2023: Initial PET:  Hypermetabolic RIGHT upper lobe lung nodule consistent primary bronchogenic carcinoma. Mildly hypermetabolic RIGHT lower paratracheal and RIGHT hilar lymph nodes are concerning for nodal metastasis. No contralateral hypermetabolic lymph nodes. No evidence of distant metastatic disease. -11/13/2023-11/17/2023: Radiation to right cerebellar area completed -11/27/2023-01/08/2024: ChemoRT with carboplatin  and paclitaxel   -02/07/2024: CT CAP:  New 10 mm subcapsular low-density lesion in the anterior left liver. Spiculated pulmonary nodule posterior right upper lobe is slightly decreased in size in the interval. Right hilar lymph node measured previously at 12 mm short axis is 6 mm short axis today. No mediastinal lymphadenopathy by CT size criteria. -02/16/2024: MRI Liver: 1.4 cm rim-enhancing mass in segment 4B of the left hepatic lobe, highly suspicious for liver metastasis. No other sites of metastatic disease identified within the abdomen. -02/27/2024-current: Carboplatin , pemetrexed  and pembrolizumab  -05/20/2024: CT CAP: Stable to improved disease -06/26/2024: MRI brain: New 5 mm enhancing focus in the right parafalcine frontal lobe with surrounding vasogenic edema. Slightly increased size of a few of the previously characterized nodular leptomeningeal enhancing foci as detailed in the body 2 of which in the high  frontal convexities demonstrate increased surrounding vasogenic edema.  -07/23/2024: Cyberknife to brain mets.  -08/13/2024: CT CAP: Unchanged spiculated mass in the posterior right apex measuring 1.5 x 1.2 cm. Unchanged tiny hypodense lesion in hepatic segment IVB measuring 0.4 cm. No evidence of lymphadenopathy or metastatic disease in the chest, abdomen, or pelvis.  Current Treatment: Maintenance pembrolizumab  and pemetrexed   INTERVAL HISTORY:   Discussed the  use of AI scribe software for clinical note transcription with the patient, who gave verbal consent to proceed.  History of Present Illness Julie Horn is a 49 year old female with metastatic non-small cell lung adenocarcinoma who presents for oncology follow-up.  She is receiving maintenance pemetrexed  and pembrolizumab  for metastatic non-small cell lung adenocarcinoma with brain and liver metastases. She remains asymptomatic from her malignancy, ambulates independently, walks two miles daily, and denies lower extremity edema, new shortness of breath, headaches, or neurological symptoms.  She reports persistent and worsening sicca symptoms, including xerophthalmia, xerostomia, nasal dryness with ulcerations, and severe vaginal dryness resulting in dyspareunia and inability to have intercourse. She consumes approximately one gallon of water daily for oral dryness and uses over-the-counter eye drops, biotin oral spray, nasal saline spray, and vaginal moisturizer. She has tried multiple skin moisturizers, including collagen and ceramide-based creams, but continues to experience diffuse xerosis. She expresses frustration with the lack of symptomatic relief.  She endorses significant fatigue and difficulty with weight management, despite regular exercise and healthy eating. She is taking levothyroxine  for hypothyroidism; recent laboratory results show improvement but not normalization of thyroid  function, with updated results  pending. She previously had lower extremity edema, now resolved, and notes intermittent digital swelling. She denies current lower extremity swelling.  She has Raynaud's phenomenon affecting her hands and toes, with episodes triggered by cold exposure and managed with gloves. An autoimmune workup, including evaluation for Sjogren's syndrome, was performed in 2020.  She reports intermittent, unpredictable episodes of abdominal cramps and diarrhea, which have improved but persist without clear dietary triggers. Her prior pembrolizumab -induced skin rash has improved, and she uses topical steroid cream infrequently.   I have reviewed the past medical history, past surgical history, social history and family history with the patient and they are unchanged from previous note.  ALLERGIES:  is allergic to levofloxacin, sulfa antibiotics, azithromycin, doxycycline , latex, and wound dressing adhesive.  MEDICATIONS:  Current Outpatient Medications  Medication Sig Dispense Refill   lidocaine -prilocaine  (EMLA ) cream Apply topically daily.     acetaminophen  (TYLENOL ) 500 MG tablet Take 1,000 mg by mouth every 6 (six) hours as needed for mild pain (pain score 1-3). Take 2 tablets (1,000 mg total) by mouth every six (6) hours.     Albuterol -Budesonide  (AIRSUPRA ) 90-80 MCG/ACT AERO Inhale 2 puffs into the lungs every 4 (four) hours as needed. 10.7 g 11   ascorbic acid (VITAMIN C) 500 MG tablet Take 500 mg by mouth daily. Take 1 tablet (500 mg total) by mouth daily     butalbital -acetaminophen -caffeine  (FIORICET) 50-325-40 MG tablet Take 1 tablet by mouth every 4 (four) hours as needed for headache. 14 tablet 1   cetirizine  (ZYRTEC ) 10 MG chewable tablet Chew 10 mg by mouth.     co-enzyme Q-10 30 MG capsule Take 30 mg by mouth 3 (three) times daily. Take 1 capsule (30 mg total) by mouth Three (3) times a day.     Cyanocobalamin  (VITAMIN B 12) 500 MCG TABS Take 500 mg by mouth daily.     folic acid  (FOLVITE ) 1 MG  tablet Take 1 tablet (1 mg total) by mouth daily. 30 tablet 6   levothyroxine  (SYNTHROID ) 75 MCG tablet Take 1 tablet (75 mcg total) by mouth daily before breakfast. 60 tablet 2   magnesium oxide (MAG-OX) 400 (240 Mg) MG tablet Take 400 mg by mouth daily. Take 1 tablet (400 mg total) by mouth daily.     melatonin 1 MG TABS tablet Take  1 mg by mouth at bedtime. Take 5 tablets (5 mg total) by mouth nightly.     ondansetron  (ZOFRAN -ODT) 4 MG disintegrating tablet Take 4 mg by mouth every 8 (eight) hours as needed.     Pembrolizumab  (KEYTRUDA  IV) Inject 1 Units into the vein every 21 ( twenty-one) days.     PEMEtrexed  Disodium (ALIMTA  IV) Inject 1 Units into the vein every 21 ( twenty-one) days.     rizatriptan  (MAXALT -MLT) 10 MG disintegrating tablet Take 1 tablet (10 mg total) by mouth as needed for migraine. May repeat in 2 hours if needed 9 tablet 11   sennosides-docusate sodium (SENOKOT-S) 8.6-50 MG tablet Take 2 tablets by mouth daily.     traMADol  (ULTRAM ) 50 MG tablet Take 1 tablet (50 mg total) by mouth every 12 (twelve) hours as needed for severe pain (pain score 7-10) (headaches). 30 tablet 0   traZODone  (DESYREL ) 150 MG tablet USE FROM 1/3 TO 1 TABLET NIGHTLY AS NEEDED FOR SLEEP. (Patient taking differently: Use from 1/6 tablet nightly as needed for sleep.) 90 tablet 1   triamcinolone  cream (KENALOG ) 0.1 % Apply 1 Application topically 2 (two) times daily. 60 g 2   No current facility-administered medications for this visit.    VITALS:  Last menstrual period 07/26/2019.  Wt Readings from Last 3 Encounters:  08/22/24 146 lb 12.8 oz (66.6 kg)  07/31/24 142 lb 3.2 oz (64.5 kg)  07/10/24 141 lb 1.5 oz (64 kg)    There is no height or weight on file to calculate BMI.  Performance status (ECOG): 0 - Asymptomatic  PHYSICAL EXAM:   GENERAL:alert, no distress and comfortable SKIN: Previous rash significantly improved EYES: normal, Conjunctiva are pink and non-injected, sclera  clear LYMPH:  no palpable lymphadenopathy in the cervical, axillary or inguinal LUNGS: clear to auscultation and percussion with normal breathing effort HEART: regular rate & rhythm and no murmurs and no lower extremity edema ABDOMEN:abdomen soft, non-tender and normal bowel sounds Musculoskeletal:no cyanosis of digits and no clubbing  NEURO: alert & oriented x 3 with fluent speech    LABORATORY DATA:  I have reviewed the data as listed     Component Value Date/Time   NA 141 07/31/2024 0935   NA 145 (H) 05/22/2023 1054   K 3.9 07/31/2024 0935   CL 105 07/31/2024 0935   CO2 22 07/31/2024 0935   GLUCOSE 85 07/31/2024 0935   BUN 8 07/31/2024 0935   BUN 7 05/22/2023 1054   CREATININE 0.57 07/31/2024 0935   CALCIUM 9.7 07/31/2024 0935   PROT 7.0 07/31/2024 0935   PROT 7.4 05/22/2023 1054   ALBUMIN 4.6 07/31/2024 0935   ALBUMIN 4.8 05/22/2023 1054   AST 30 07/31/2024 0935   ALT 50 (H) 07/31/2024 0935   ALKPHOS 58 07/31/2024 0935   BILITOT 0.3 07/31/2024 0935   BILITOT 0.5 05/22/2023 1054   GFRNONAA >60 07/31/2024 0935     Lab Results  Component Value Date   WBC 2.5 (L) 08/22/2024   NEUTROABS 1.2 (L) 08/22/2024   HGB 12.8 08/22/2024   HCT 37.7 08/22/2024   MCV 101.3 (H) 08/22/2024   PLT 173 08/22/2024    Latest Reference Range & Units 08/22/24 08:37  TSH 0.350 - 4.500 uIU/mL 12.800 (H)  (H): Data is abnormally high   RADIOGRAPHIC STUDIES: I have personally reviewed the radiological images as listed and agreed with the findings in the report.  CT CHEST ABDOMEN PELVIS W CONTRAST CLINICAL DATA:  Metastatic lung cancer restaging *  Tracking Code: BO *  EXAM: CT CHEST, ABDOMEN, AND PELVIS WITH CONTRAST  TECHNIQUE: Multidetector CT imaging of the chest, abdomen and pelvis was performed following the standard protocol during bolus administration of intravenous contrast.  RADIATION DOSE REDUCTION: This exam was performed according to the departmental  dose-optimization program which includes automated exposure control, adjustment of the mA and/or kV according to patient size and/or use of iterative reconstruction technique.  CONTRAST:  OMNIPAQUE  IOHEXOL  300 MG/ML  SOLN  COMPARISON:  05/20/2024  FINDINGS: CT CHEST FINDINGS  Cardiovascular: Right chest port catheter. Scattered aortic atherosclerosis. Normal heart size. No pericardial effusion.  Mediastinum/Nodes: No enlarged mediastinal, hilar, or axillary lymph nodes. Thyroid  gland, trachea, and esophagus demonstrate no significant findings.  Lungs/Pleura: Moderate emphysema. Diffuse bilateral bronchial wall thickening and a background of fine centrilobular nodularity. Unchanged spiculated mass in the posterior right apex measuring 1.5 x 1.2 cm (series 5, image 26). No pleural effusion or pneumothorax.  Musculoskeletal: No chest wall abnormality. No acute osseous findings.  CT ABDOMEN PELVIS FINDINGS  Hepatobiliary: Unchanged tiny hypodense lesion subcapsular lesion in hepatic segment IVB measuring 0.4 cm (series 2, image 65). Hepatic steatosis. No gallstones, gallbladder wall thickening, or biliary dilatation.  Pancreas: Unremarkable. No pancreatic ductal dilatation or surrounding inflammatory changes.  Spleen: Normal in size without significant abnormality.  Adrenals/Urinary Tract: Adrenal glands are unremarkable. Kidneys are normal, without renal calculi, solid lesion, or hydronephrosis. Bladder is unremarkable.  Stomach/Bowel: Stomach is within normal limits. Appendix appears normal. No evidence of bowel wall thickening, distention, or inflammatory changes.  Vascular/Lymphatic: No significant vascular findings are present. No enlarged abdominal or pelvic lymph nodes.  Reproductive: Uterine fibroids.  Other: No abdominal wall hernia or abnormality. No ascites.  Musculoskeletal: No acute osseous findings.  IMPRESSION: 1. Unchanged spiculated mass in  the posterior right apex measuring 1.5 x 1.2 cm. 2. Unchanged tiny hypodense lesion in hepatic segment IVB measuring 0.4 cm. 3. No evidence of lymphadenopathy or metastatic disease in the chest, abdomen, or pelvis. 4. Emphysema and smoking-related respiratory bronchiolitis. 5. Hepatic steatosis. 6. Uterine fibroids.  Aortic Atherosclerosis (ICD10-I70.0) and Emphysema (ICD10-J43.9).  Electronically Signed   By: Marolyn JONETTA Jaksch M.D.   On: 08/16/2024 16:29   "

## 2024-08-22 NOTE — Patient Instructions (Addendum)
 [facility] Discharge Instructions   You were seen and examined today by Dr. Davonna.  She reviewed the results of your lab work which are normal/stable.   She reviewed the results of your CT scan which is stable. The cancer has not grown or spread.   Use Biotin spray for dry mouth. Continue to use artificial tears 6-8 times per day for dry eye.   We will proceed with your treatment today.   Return as scheduled.    Thank you for choosing [facility] to provide your oncology and hematology care.  To afford each patient quality time with our provider, please arrive at least 15 minutes before your scheduled appointment time.   If you have a lab appointment with the Cancer Center please come in thru the Main Entrance and check in at the main information desk.  You need to re-schedule your appointment should you arrive 10 or more minutes late.  We strive to give you quality time with our providers, and arriving late affects you and other patients whose appointments are after yours.  Also, if you no show three or more times for appointments you may be dismissed from the clinic at the providers discretion.     Again, thank you for choosing [facility].  Our hope is that these requests will decrease the amount of time that you wait before being seen by our physicians.       _____________________________________________________________  Should you have questions after your visit to [facility], please contact our office at [phone] and follow the prompts.  Our office hours are 8:00 a.m. and 4:30 p.m. Monday - Friday.  Please note that voicemails left after 4:00 p.m. may not be returned until the following business day.  We are closed weekends and major holidays.  You do have access to a nurse 24-7, just call the main number to the clinic [phone] and do not press any options, hold on the line  and a nurse will answer the phone.    For prescription refill requests, have your pharmacy contact our office and allow 72 hours.    Due to Covid, you will need to wear a mask upon entering the hospital. If you do not have a mask, a mask will be given to you at the Main Entrance upon arrival. For doctor visits, patients may have 1 support person age 58 or older with them. For treatment visits, patients can not have anyone with them due to social distancing guidelines and our immunocompromised population.

## 2024-08-22 NOTE — Patient Instructions (Signed)
 [facility] [place] - A DEPT OF [facility]  Discharge Instructions: Thank you for choosing [facility] to provide your oncology and hematology care.  If you have a lab appointment with the Cancer Center - please note that after April 8th, 2024, all labs will be drawn in the cancer center.  You do not have to check in or register with the main entrance as you have in the past but will complete your check-in in the cancer center.  Wear comfortable clothing and clothing appropriate for easy access to any Portacath or PICC line.   We strive to give you quality time with your provider. You may need to reschedule your appointment if you arrive late (15 or more minutes).  Arriving late affects you and other patients whose appointments are after yours.  Also, if you miss three or more appointments without notifying the office, you may be dismissed from the clinic at the providers discretion.      For prescription refill requests, have your pharmacy contact our office and allow 72 hours for refills to be completed.    Today you received the following chemotherapy and/or immunotherapy agents keytruda  and alimta    To help prevent nausea and vomiting after your treatment, we encourage you to take your nausea medication as directed.  BELOW ARE SYMPTOMS THAT SHOULD BE REPORTED IMMEDIATELY: *FEVER GREATER THAN 100.4 F (38 C) OR HIGHER *CHILLS OR SWEATING *NAUSEA AND VOMITING THAT IS NOT CONTROLLED WITH YOUR NAUSEA MEDICATION *UNUSUAL SHORTNESS OF BREATH *UNUSUAL BRUISING OR BLEEDING *URINARY PROBLEMS (pain or burning when urinating, or frequent urination) *BOWEL PROBLEMS (unusual diarrhea, constipation, pain near the anus) TENDERNESS IN MOUTH AND THROAT WITH OR WITHOUT PRESENCE OF ULCERS (sore throat, sores in mouth, or a toothache) UNUSUAL RASH, SWELLING OR PAIN  UNUSUAL VAGINAL DISCHARGE OR ITCHING   Items with * indicate a potential emergency and should be  followed up as soon as possible or go to the Emergency Department if any problems should occur.  Please show the CHEMOTHERAPY ALERT CARD or IMMUNOTHERAPY ALERT CARD at check-in to the Emergency Department and triage nurse.  Should you have questions after your visit or need to cancel or reschedule your appointment, please contact [facility] [place] - A DEPT OF [facility] [phone]  and follow the prompts.  Office hours are 8:00 a.m. to 4:30 p.m. Monday - Friday. Please note that voicemails left after 4:00 p.m. may not be returned until the following business day.  We are closed weekends and major holidays. You have access to a nurse at all times for urgent questions. Please call the main number to the clinic [phone] and follow the prompts.  For any non-urgent questions, you may also contact your provider using MyChart. We now offer e-Visits for anyone 72 and older to request care online for non-urgent symptoms. For details visit [email or web address].   Also download the MyChart app! Go to the app store, search MyChart, open the app, select [facility], and log in with your MyChart username and password.

## 2024-08-22 NOTE — Progress Notes (Signed)
 Patient has been examined by Dr. Davonna. Vital signs and labs have been reviewed by MD - ANC (1.2), Creatinine, LFTs, hemoglobin, and platelets have been reviewed by M.D. - pt may proceed with treatment.  Primary RN and pharmacy notified.

## 2024-08-23 LAB — RHEUMATOID FACTOR: Rheumatoid fact SerPl-aCnc: 13.1 [IU]/mL

## 2024-08-23 LAB — SCLERODERMA DIAGNOSTIC PROFILE
Anti Nuclear Antibody (ANA): NEGATIVE
Scleroderma (Scl-70) (ENA) Antibody, IgG: <0.2 AI (ref 0.0–0.9)

## 2024-08-23 LAB — HEMOGLOBIN A1C
Hgb A1c MFr Bld: 5.1 % (ref 4.8–5.6)
Mean Plasma Glucose: 99.67 mg/dL

## 2024-08-23 LAB — SJOGREN'S SYNDROME ANTIBODS(SSA + SSB)
SSA (Ro) (ENA) Antibody, IgG: 0.4 AI (ref 0.0–0.9)
SSB (La) (ENA) Antibody, IgG: <0.2 AI (ref 0.0–0.9)

## 2024-08-23 LAB — T4: T4, Total: 8.6 ug/dL (ref 4.5–12.0)

## 2024-08-23 LAB — ANTINUCLEAR ANTIBODIES, IFA: ANA Ab, IFA: NEGATIVE

## 2024-08-26 ENCOUNTER — Encounter: Payer: Self-pay | Admitting: Oncology

## 2024-08-27 ENCOUNTER — Other Ambulatory Visit (HOSPITAL_COMMUNITY): Payer: Self-pay

## 2024-08-27 ENCOUNTER — Other Ambulatory Visit: Payer: Self-pay

## 2024-08-27 ENCOUNTER — Encounter: Payer: Self-pay | Admitting: Oncology

## 2024-08-27 MED ORDER — ESTRADIOL 0.01 % VA CREA: 1.0000 | TOPICAL_CREAM | Freq: Every day | VAGINAL | 12 refills | Status: AC

## 2024-08-28 ENCOUNTER — Encounter: Payer: Self-pay | Admitting: Oncology

## 2024-09-11 ENCOUNTER — Inpatient Hospital Stay: Payer: Self-pay

## 2024-09-11 ENCOUNTER — Inpatient Hospital Stay: Payer: Self-pay | Attending: Hematology

## 2024-09-13 ENCOUNTER — Ambulatory Visit: Admitting: Internal Medicine

## 2024-10-03 ENCOUNTER — Inpatient Hospital Stay

## 2024-10-03 ENCOUNTER — Inpatient Hospital Stay: Attending: Hematology

## 2024-10-03 ENCOUNTER — Inpatient Hospital Stay: Admitting: Oncology
